# Patient Record
Sex: Male | Born: 1950
Health system: Southern US, Community
[De-identification: ages and names within clinical notes are randomized; demographics above are authoritative.]

## PROBLEM LIST (undated history)

## (undated) DIAGNOSIS — K219 Gastro-esophageal reflux disease without esophagitis: Secondary | ICD-10-CM

## (undated) DIAGNOSIS — Z8669 Personal history of other diseases of the nervous system and sense organs: Secondary | ICD-10-CM

## (undated) DIAGNOSIS — M1990 Unspecified osteoarthritis, unspecified site: Secondary | ICD-10-CM

## (undated) DIAGNOSIS — F32A Depression, unspecified: Secondary | ICD-10-CM

## (undated) DIAGNOSIS — M545 Low back pain, unspecified: Secondary | ICD-10-CM

## (undated) DIAGNOSIS — G4700 Insomnia, unspecified: Secondary | ICD-10-CM

## (undated) DIAGNOSIS — F329 Major depressive disorder, single episode, unspecified: Secondary | ICD-10-CM

## (undated) DIAGNOSIS — C4491 Basal cell carcinoma of skin, unspecified: Secondary | ICD-10-CM

## (undated) DIAGNOSIS — K120 Recurrent oral aphthae: Secondary | ICD-10-CM

## (undated) DIAGNOSIS — R350 Frequency of micturition: Secondary | ICD-10-CM

## (undated) DIAGNOSIS — G5760 Lesion of plantar nerve, unspecified lower limb: Secondary | ICD-10-CM

## (undated) DIAGNOSIS — J439 Emphysema, unspecified: Secondary | ICD-10-CM

## (undated) DIAGNOSIS — R51 Headache: Secondary | ICD-10-CM

## (undated) DIAGNOSIS — I10 Essential (primary) hypertension: Secondary | ICD-10-CM

## (undated) DIAGNOSIS — F419 Anxiety disorder, unspecified: Secondary | ICD-10-CM

## (undated) DIAGNOSIS — R1310 Dysphagia, unspecified: Secondary | ICD-10-CM

## (undated) DIAGNOSIS — M797 Fibromyalgia: Secondary | ICD-10-CM

## (undated) DIAGNOSIS — G4730 Sleep apnea, unspecified: Secondary | ICD-10-CM

## (undated) DIAGNOSIS — R0683 Snoring: Secondary | ICD-10-CM

## (undated) DIAGNOSIS — R519 Headache, unspecified: Secondary | ICD-10-CM

## (undated) DIAGNOSIS — G4733 Obstructive sleep apnea (adult) (pediatric): Secondary | ICD-10-CM

## (undated) DIAGNOSIS — N400 Enlarged prostate without lower urinary tract symptoms: Secondary | ICD-10-CM

## (undated) DIAGNOSIS — B009 Herpesviral infection, unspecified: Secondary | ICD-10-CM

## (undated) HISTORY — DX: Low back pain, unspecified: M54.50

## (undated) HISTORY — PX: TRANSURETHRAL INCISION OF PROSTATE: SHX2573

## (undated) HISTORY — DX: Essential (primary) hypertension: I10

## (undated) HISTORY — DX: Sleep apnea, unspecified: G47.30

## (undated) HISTORY — PX: FOOT SURGERY: SHX648

## (undated) HISTORY — DX: Insomnia, unspecified: G47.00

## (undated) HISTORY — PX: TONSILLECTOMY: SUR1361

## (undated) HISTORY — DX: Headache: R51

## (undated) HISTORY — DX: Personal history of other diseases of the nervous system and sense organs: Z86.69

## (undated) HISTORY — DX: Lesion of plantar nerve, unspecified lower limb: G57.60

## (undated) HISTORY — DX: Dysphagia, unspecified: R13.10

## (undated) HISTORY — PX: MOUTH SURGERY: SHX715

## (undated) HISTORY — DX: Benign prostatic hyperplasia without lower urinary tract symptoms: N40.0

## (undated) HISTORY — DX: Fibromyalgia: M79.7

## (undated) HISTORY — PX: INGUINAL HERNIA REPAIR: SUR1180

## (undated) HISTORY — PX: SKIN CANCER EXCISION: SHX779

## (undated) HISTORY — DX: Unspecified osteoarthritis, unspecified site: M19.90

## (undated) HISTORY — DX: Snoring: R06.83

## (undated) HISTORY — DX: Emphysema, unspecified: J43.9

## (undated) HISTORY — PX: EPIDIDYMECTOMY: SHX478

## (undated) HISTORY — DX: Headache, unspecified: R51.9

## (undated) HISTORY — PX: EYE SURGERY: SHX253

## (undated) HISTORY — DX: Obstructive sleep apnea (adult) (pediatric): G47.33

## (undated) HISTORY — DX: Basal cell carcinoma of skin, unspecified: C44.91

## (undated) HISTORY — DX: Herpesviral infection, unspecified: B00.9

## (undated) HISTORY — DX: Low back pain: M54.5

## (undated) HISTORY — PX: OTHER SURGICAL HISTORY: SHX169

## (undated) HISTORY — DX: Anxiety disorder, unspecified: F41.9

---

## 2008-06-11 ENCOUNTER — Emergency Department (HOSPITAL_BASED_OUTPATIENT_CLINIC_OR_DEPARTMENT_OTHER): Admission: EM | Admit: 2008-06-11 | Discharge: 2008-06-11 | Payer: Self-pay | Admitting: Emergency Medicine

## 2008-06-11 ENCOUNTER — Ambulatory Visit: Payer: Self-pay | Admitting: Diagnostic Radiology

## 2008-09-05 ENCOUNTER — Ambulatory Visit (HOSPITAL_COMMUNITY): Admission: RE | Admit: 2008-09-05 | Discharge: 2008-09-05 | Payer: Self-pay | Admitting: Orthopedic Surgery

## 2008-09-17 ENCOUNTER — Ambulatory Visit (HOSPITAL_COMMUNITY): Admission: RE | Admit: 2008-09-17 | Discharge: 2008-09-17 | Payer: Self-pay | Admitting: Orthopedic Surgery

## 2009-05-27 DIAGNOSIS — G2581 Restless legs syndrome: Secondary | ICD-10-CM | POA: Insufficient documentation

## 2010-06-21 LAB — BASIC METABOLIC PANEL
BUN: 10 mg/dL (ref 6–23)
Calcium: 9.7 mg/dL (ref 8.4–10.5)
GFR calc non Af Amer: >60 mL/min (ref >60)
Glucose, Bld: 92 mg/dL (ref 70–99)
Potassium: 4 mEq/L (ref 3.5–5.1)

## 2010-06-21 LAB — HEMOGLOBIN AND HEMATOCRIT, BLOOD: Hemoglobin: 15.4 g/dL (ref 13.0–17.0)

## 2010-07-27 NOTE — Op Note (Signed)
Patrick [name], Patrick [name]         ACCOUNT NO.:  [id number]   MEDICAL RECORD NO.:  [id number]          PATIENT TYPE:  AMB   LOCATION:  DAY                          FACILITY:  [facility]   PHYSICIAN:  Marlowe Kays, M.D.  DATE OF BIRTH:  Feb 06, 1951   DATE OF PROCEDURE:  09/05/2008  DATE OF DISCHARGE:                               OPERATIVE REPORT   PREOPERATIVE DIAGNOSIS:  Chronic plantar fasciitis left heel.   POSTOPERATIVE DIAGNOSIS:  Chronic plantar fasciitis left heel.   OPERATION:  Release plantar fascia left heel.   SURGEON:  Dr. Simonne Come.   ASSISTANT:  Nurse.   ANESTHESIA:  General.   PATHOLOGY AND JUSTIFICATION FOR PROCEDURE:  He has had chronic pain  __________for chronic plantar fasciitis over the medial aspect of his  left heel, refractory to all nonsurgical treatment.   PROCEDURE:  Satisfactory general anesthesia, pneumatic tourniquet, the  left leg was Esmarched out nonsterilely and tourniquet inflated to 350  mmHg.  The left leg was prepped from ankle to toes with DuraPrep and  draped in a sterile field.  A timeout performed.  I made a 2-cm incision  over the posteromedial arch overlapping the heel.  The plantar fascia  was isolated superiorly and inferiorly and released across the entire  width of the heel with some Metzenbaum scissors.  The fascia was very  thickened and with an audible crunch on sectioning, consistent with his  diagnosis.  When I felt like we had released all the affected fibers,  the wound was irrigated with sterile saline and soft tissues infiltrated  with half-percent plain Marcaine.  The wound was then closed with  interrupted 2-0 Vicryl and subcutaneous tissue with interrupted 4-0  nylon mattress sutures in the skin.  Betadine adaptic dry sterile  dressings were applied.  The tourniquet was released.  He tolerated the  procedure well and was taken to the recovery room in satisfactory  condition with no known complications.     ______________________________  Marlowe Kays, M.D.     JA/MEDQ  D:  09/05/2008  T:  09/05/2008  Job:  [id number]

## 2011-12-20 ENCOUNTER — Encounter (INDEPENDENT_AMBULATORY_CARE_PROVIDER_SITE_OTHER): Payer: Self-pay | Admitting: Surgery

## 2011-12-20 ENCOUNTER — Ambulatory Visit (INDEPENDENT_AMBULATORY_CARE_PROVIDER_SITE_OTHER): Payer: BC Managed Care – PPO | Admitting: Surgery

## 2011-12-20 VITALS — BP 117/77 | HR 83 | Temp 98.1°F | Resp 14 | Ht 73.0 in | Wt 189.0 lb

## 2011-12-20 DIAGNOSIS — K644 Residual hemorrhoidal skin tags: Secondary | ICD-10-CM

## 2011-12-20 NOTE — Progress Notes (Signed)
Subjective:     Patient ID: Patrick [name], male   DOB: 1951/03/09, 61 y.o.   MRN: [id number]  HPI This is a patient who had excision of a thrombosed hemorrhoid by Dr. Zachery Dakins approximately 2 years ago.  He has had perianal discomfort for the last several months. He has been improving somewhat with Preparation H. He describes a burning pain. He has no spasmodic pain has had no bleeding.  Review of Systems     Objective:   Physical Exam On exam, his perianal area is erythematous.  There are no enlarged external hemorrhoids. Digital examination was unremarkable    Assessment:     Inflamed external hemorrhoids    Plan:     I wrote him for steroid cream as well as lidocaine cream. There is nothing else to do from a surgical standpoint. He would do stool softeners and sitz baths. I will see him back as needed

## 2013-09-24 ENCOUNTER — Emergency Department (HOSPITAL_BASED_OUTPATIENT_CLINIC_OR_DEPARTMENT_OTHER)
Admission: EM | Admit: 2013-09-24 | Discharge: 2013-09-24 | Disposition: A | Discharge status: Home / Self Care | Payer: BC Managed Care – PPO | Attending: Emergency Medicine | Admitting: Emergency Medicine

## 2013-09-24 ENCOUNTER — Emergency Department (HOSPITAL_BASED_OUTPATIENT_CLINIC_OR_DEPARTMENT_OTHER): Payer: BC Managed Care – PPO

## 2013-09-24 ENCOUNTER — Encounter (HOSPITAL_BASED_OUTPATIENT_CLINIC_OR_DEPARTMENT_OTHER): Payer: Self-pay | Admitting: Emergency Medicine

## 2013-09-24 DIAGNOSIS — R110 Nausea: Secondary | ICD-10-CM | POA: Insufficient documentation

## 2013-09-24 DIAGNOSIS — R1031 Right lower quadrant pain: Secondary | ICD-10-CM

## 2013-09-24 DIAGNOSIS — Z9889 Other specified postprocedural states: Secondary | ICD-10-CM | POA: Insufficient documentation

## 2013-09-24 DIAGNOSIS — Z87891 Personal history of nicotine dependence: Secondary | ICD-10-CM | POA: Insufficient documentation

## 2013-09-24 LAB — CBC WITH DIFFERENTIAL/PLATELET
Basophils Absolute: 0 10*3/uL (ref 0.0–0.1)
Basophils Relative: 0 % (ref 0–1)
Eosinophils Absolute: 0.1 10*3/uL (ref 0.0–0.7)
Eosinophils Relative: 1 % (ref 0–5)
HCT: 45.3 % (ref 39.0–52.0)
HEMOGLOBIN: 15.7 g/dL (ref 13.0–17.0)
LYMPHS ABS: 1.3 10*3/uL (ref 0.7–4.0)
Lymphocytes Relative: 13 % (ref 12–46)
MCH: 31.3 pg (ref 26.0–34.0)
MCHC: 34.7 g/dL (ref 30.0–36.0)
MCV: 90.2 fL (ref 78.0–100.0)
MONOS PCT: 8 % (ref 3–12)
Monocytes Absolute: 0.8 10*3/uL (ref 0.1–1.0)
NEUTROS ABS: 8.1 10*3/uL — AB (ref 1.7–7.7)
NEUTROS PCT: 79 % — AB (ref 43–77)
Platelets: 150 10*3/uL (ref 150–400)
RBC: 5.02 MIL/uL (ref 4.22–5.81)
RDW: 12.6 % (ref 11.5–15.5)
WBC: 10.3 10*3/uL (ref 4.0–10.5)

## 2013-09-24 LAB — BASIC METABOLIC PANEL
Anion gap: 13 (ref 5–15)
BUN: 16 mg/dL (ref 6–23)
CHLORIDE: 100 meq/L (ref 96–112)
CO2: 28 mEq/L (ref 19–32)
Calcium: 10.1 mg/dL (ref 8.4–10.5)
Creatinine, Ser: 1.1 mg/dL (ref 0.50–1.35)
GFR calc non Af Amer: 71 mL/min — ABNORMAL LOW (ref >90)
GFR, EST AFRICAN AMERICAN: 82 mL/min — AB (ref >90)
GLUCOSE: 96 mg/dL (ref 70–99)
POTASSIUM: 3.9 meq/L (ref 3.7–5.3)
Sodium: 141 mEq/L (ref 137–147)

## 2013-09-24 LAB — URINALYSIS, ROUTINE W REFLEX MICROSCOPIC
Bilirubin Urine: NEGATIVE
Glucose, UA: NEGATIVE mg/dL
Hgb urine dipstick: NEGATIVE
Ketones, ur: NEGATIVE mg/dL
LEUKOCYTES UA: NEGATIVE
NITRITE: NEGATIVE
PH: 6.5 (ref 5.0–8.0)
Protein, ur: NEGATIVE mg/dL
SPECIFIC GRAVITY, URINE: 1.009 (ref 1.005–1.030)
Urobilinogen, UA: 0.2 mg/dL (ref 0.0–1.0)

## 2013-09-24 MED ORDER — IOHEXOL 300 MG/ML  SOLN
100.0000 mL | Freq: Once | INTRAMUSCULAR | Status: AC | PRN
  Administered 2013-09-24: 100 mL via INTRAVENOUS

## 2013-09-24 MED ORDER — HYDROCODONE-ACETAMINOPHEN 5-325 MG PO TABS: 1.0000 | ORAL_TABLET | Freq: Four times a day (QID) | ORAL | Status: DC | PRN

## 2013-09-24 MED ORDER — ONDANSETRON HCL 4 MG PO TABS: 4.0000 mg | ORAL_TABLET | Freq: Four times a day (QID) | ORAL | Status: DC

## 2013-09-24 MED ORDER — IOHEXOL 300 MG/ML  SOLN
50.0000 mL | Freq: Once | INTRAMUSCULAR | Status: AC | PRN
  Administered 2013-09-24: 50 mL via ORAL

## 2013-09-24 NOTE — ED Notes (Signed)
C/oRLQ pain, indigestion started today

## 2013-09-24 NOTE — Discharge Instructions (Signed)

## 2013-09-24 NOTE — ED Notes (Signed)
MD at bedside discussing test results and dispo plan of care. 

## 2013-09-24 NOTE — ED Provider Notes (Signed)
CSN: [id number]     Arrival date & time 09/24/13  1738 History   First MD Initiated Contact with Patient 09/24/13 1747     Chief Complaint  Patient presents with  . Abdominal Pain     (Consider location/radiation/quality/duration/timing/severity/associated sxs/prior Treatment) HPI  This is a 63 year old male who presents from [facility] with right lower cough for pain. Patient reports onset of pain at lunch today. He reports pain in the right lower cough. It is achy and nonradiating. He denies any urinary symptoms or hematuria. He does endorse nausea without vomiting. No diarrhea noted. Currently his pain is 4/10. He was seen at [facility] and sent here for evaluation for appendicitis. He denies any fevers.  Patient took antacids after lunch without relief.  History reviewed. No pertinent past medical history. Past Surgical History  Procedure Laterality Date  . Pancreatic surgery r/t trauma     No family history on file. History  Substance Use Topics  . Smoking status: Former Research scientist (life sciences)  . Smokeless tobacco: Not on file  . Alcohol Use: Yes    Review of Systems  Constitutional: Negative.  Negative for fever.  Respiratory: Negative.  Negative for chest tightness and shortness of breath.   Cardiovascular: Negative.  Negative for chest pain.  Gastrointestinal: Positive for nausea and abdominal pain. Negative for vomiting and diarrhea.  Genitourinary: Negative.  Negative for dysuria and hematuria.  Musculoskeletal: Negative for back pain.  Neurological: Negative for headaches.  All other systems reviewed and are negative.     Allergies  Review of patient's allergies indicates no known allergies.  Home Medications   Prior to Admission medications   Medication Sig Start Date End Date Taking? Authorizing Provider  NORTRIPTYLINE HCL PO Take by mouth.   Yes Historical Provider, MD  Pregabalin (LYRICA PO) Take by mouth.   Yes Historical Provider, MD  Zolpidem Tartrate  (AMBIEN PO) Take by mouth.   Yes Historical Provider, MD  amitriptyline (ELAVIL) 75 MG tablet  12/01/11   Historical Provider, MD  HYDROcodone-acetaminophen (NORCO/VICODIN) 5-325 MG per tablet  12/15/11   Historical Provider, MD  HYDROcodone-acetaminophen (NORCO/VICODIN) 5-325 MG per tablet Take 1 tablet by mouth every 6 (six) hours as needed. 09/24/13   Merryl Hacker, MD  ondansetron (ZOFRAN) 4 MG tablet Take 1 tablet (4 mg total) by mouth every 6 (six) hours. 09/24/13   Merryl Hacker, MD   BP 144/93  Pulse 78  Temp(Src) 99.1 F (37.3 C) (Oral)  Resp 16  Ht 6' (1.829 m)  Wt 188 lb (85.276 kg)  BMI 25.49 kg/m2  SpO2 99% Physical Exam  Nursing note and vitals reviewed. Constitutional: He is oriented to person, place, and time. He appears well-developed and well-nourished. No distress.  HENT:  Head: Normocephalic and atraumatic.  Mouth/Throat: Oropharynx is clear and moist.  Eyes: Pupils are equal, round, and reactive to light.  Cardiovascular: Normal rate, regular rhythm and normal heart sounds.   No murmur heard. Pulmonary/Chest: Effort normal and breath sounds normal. No respiratory distress. He has no wheezes.  Abdominal: Soft. Bowel sounds are normal. There is tenderness. There is no rebound.  Tenderness to palpation over the right lower quadrant, no rebound or guarding, negative Rovsing  Musculoskeletal: He exhibits no edema.  Lymphadenopathy:    He has no cervical adenopathy.  Neurological: He is alert and oriented to person, place, and time.  Skin: Skin is warm and dry.  Psychiatric: He has a normal mood and affect.    ED Course  Procedures (including critical care time) Labs Review Labs Reviewed  CBC WITH DIFFERENTIAL - Abnormal; Notable for the following:    Neutrophils Relative % 79 (*)    Neutro Abs 8.1 (*)    All other components within normal limits  BASIC METABOLIC PANEL - Abnormal; Notable for the following:    GFR calc non Af Amer 71 (*)    GFR calc Af  Amer 82 (*)    All other components within normal limits  URINALYSIS, ROUTINE W REFLEX MICROSCOPIC    Imaging Review Ct Abdomen Pelvis W Contrast  09/24/2013   CLINICAL DATA:  Right lower quadrant pain  EXAM: CT ABDOMEN AND PELVIS WITH CONTRAST  TECHNIQUE: Multidetector CT imaging of the abdomen and pelvis was performed using the standard protocol following bolus administration of intravenous contrast.  CONTRAST:  55mL OMNIPAQUE IOHEXOL 300 MG/ML SOLN, 173mL OMNIPAQUE IOHEXOL 300 MG/ML SOLN  COMPARISON:  None.  FINDINGS: Lung bases are free of acute infiltrate or sizable effusion. Mild coronary calcifications are seen.  The gallbladder, liver, spleen, adrenal glands and pancreas are within normal limits.  Kidneys are well visualized and demonstrate a normal enhancement pattern. Minimal fullness of the collecting systems is noted bilaterally although no obstructive changes are seen. This may be related to mild ureterovesical junction narrowing. Normal excretion of contrast material is noted on delayed images. The appendix is within normal limits.  Bladder is well distended. No pelvic mass lesion is seen. No bony abnormality is seen.  IMPRESSION: No acute abnormality noted.   Electronically Signed   By: Inez Catalina M.D.   On: 09/24/2013 19:47     EKG Interpretation None      MDM   Final diagnoses:  Right lower quadrant abdominal pain   Patient presents with onset of right lower cough her pain today. He is afebrile and nontoxic on exam. No signs of peritonitis. Was evaluated at another facility and is here for further evaluation for appendicitis. Presentation is not convincing for appendicitis; however, patient does have right lower cough or pain. Other considerations include kidney stone and less likely colitis given absence of diarrhea. Basic labwork obtained. Patient has declined pain medication. CT scan of the abdomen and pelvis was obtained to evaluate for his kidney stones and appendicitis.  CT scan is within normal limits. Discussed results with the patient. He states that his pain has gotten better but he still does have some nausea. He is continuing to decline pain medication. We'll discharge him with pain medication. Have given the patient return precautions including onset of fever, worsening right lower quadrant pain, or any new or worsening symptoms. While imaging is negative at this time, it is not perfect and he needs to be reassessed if his symptoms do not resolve in the next 24 hours.  After history, exam, and medical workup I feel the patient has been appropriately medically screened and is safe for discharge home. Pertinent diagnoses were discussed with the patient. Patient was given return precautions.     Merryl Hacker, MD 09/24/13 2001

## 2013-11-18 DIAGNOSIS — M999 Biomechanical lesion, unspecified: Secondary | ICD-10-CM | POA: Insufficient documentation

## 2013-11-18 DIAGNOSIS — R109 Unspecified abdominal pain: Secondary | ICD-10-CM | POA: Insufficient documentation

## 2013-11-18 DIAGNOSIS — K5900 Constipation, unspecified: Secondary | ICD-10-CM | POA: Insufficient documentation

## 2013-11-18 DIAGNOSIS — K219 Gastro-esophageal reflux disease without esophagitis: Secondary | ICD-10-CM | POA: Insufficient documentation

## 2013-11-18 DIAGNOSIS — G43909 Migraine, unspecified, not intractable, without status migrainosus: Secondary | ICD-10-CM | POA: Insufficient documentation

## 2013-11-18 DIAGNOSIS — M5136 Other intervertebral disc degeneration, lumbar region: Secondary | ICD-10-CM | POA: Insufficient documentation

## 2013-11-18 DIAGNOSIS — L209 Atopic dermatitis, unspecified: Secondary | ICD-10-CM | POA: Insufficient documentation

## 2013-11-18 DIAGNOSIS — IMO0001 Reserved for inherently not codable concepts without codable children: Secondary | ICD-10-CM | POA: Insufficient documentation

## 2013-11-18 DIAGNOSIS — E236 Other disorders of pituitary gland: Secondary | ICD-10-CM | POA: Insufficient documentation

## 2013-11-18 DIAGNOSIS — J343 Hypertrophy of nasal turbinates: Secondary | ICD-10-CM | POA: Insufficient documentation

## 2013-11-18 DIAGNOSIS — G4733 Obstructive sleep apnea (adult) (pediatric): Secondary | ICD-10-CM | POA: Insufficient documentation

## 2013-11-18 DIAGNOSIS — N3281 Overactive bladder: Secondary | ICD-10-CM | POA: Insufficient documentation

## 2013-11-18 DIAGNOSIS — M7710 Lateral epicondylitis, unspecified elbow: Secondary | ICD-10-CM | POA: Insufficient documentation

## 2013-11-18 DIAGNOSIS — M47816 Spondylosis without myelopathy or radiculopathy, lumbar region: Secondary | ICD-10-CM | POA: Insufficient documentation

## 2013-11-18 DIAGNOSIS — K6289 Other specified diseases of anus and rectum: Secondary | ICD-10-CM | POA: Insufficient documentation

## 2013-11-18 DIAGNOSIS — S6710XA Crushing injury of unspecified finger(s), initial encounter: Secondary | ICD-10-CM | POA: Insufficient documentation

## 2013-11-18 DIAGNOSIS — N138 Other obstructive and reflux uropathy: Secondary | ICD-10-CM | POA: Insufficient documentation

## 2013-11-18 DIAGNOSIS — N320 Bladder-neck obstruction: Secondary | ICD-10-CM | POA: Insufficient documentation

## 2013-11-18 DIAGNOSIS — R599 Enlarged lymph nodes, unspecified: Secondary | ICD-10-CM | POA: Insufficient documentation

## 2013-11-18 DIAGNOSIS — J342 Deviated nasal septum: Secondary | ICD-10-CM | POA: Insufficient documentation

## 2013-11-18 DIAGNOSIS — N401 Enlarged prostate with lower urinary tract symptoms: Secondary | ICD-10-CM

## 2014-04-09 DIAGNOSIS — M5459 Other low back pain: Secondary | ICD-10-CM | POA: Insufficient documentation

## 2014-07-29 ENCOUNTER — Ambulatory Visit (INDEPENDENT_AMBULATORY_CARE_PROVIDER_SITE_OTHER): Payer: BLUE CROSS/BLUE SHIELD | Admitting: Neurology

## 2014-07-29 ENCOUNTER — Encounter: Payer: Self-pay | Admitting: Neurology

## 2014-07-29 VITALS — BP 138/90 | HR 78 | Resp 16 | Ht 73.0 in | Wt 192.0 lb

## 2014-07-29 DIAGNOSIS — G4761 Periodic limb movement disorder: Secondary | ICD-10-CM

## 2014-07-29 DIAGNOSIS — G4733 Obstructive sleep apnea (adult) (pediatric): Secondary | ICD-10-CM | POA: Diagnosis not present

## 2014-07-29 DIAGNOSIS — E663 Overweight: Secondary | ICD-10-CM

## 2014-07-29 DIAGNOSIS — R0683 Snoring: Secondary | ICD-10-CM

## 2014-07-29 DIAGNOSIS — G4719 Other hypersomnia: Secondary | ICD-10-CM

## 2014-07-29 DIAGNOSIS — R51 Headache: Secondary | ICD-10-CM

## 2014-07-29 DIAGNOSIS — R519 Headache, unspecified: Secondary | ICD-10-CM

## 2014-07-29 DIAGNOSIS — R030 Elevated blood-pressure reading, without diagnosis of hypertension: Secondary | ICD-10-CM

## 2014-07-29 NOTE — Patient Instructions (Addendum)
Based on your symptoms and your exam I believe you are at risk for obstructive sleep apnea or OSA, and I think we should proceed with a sleep study to determine whether you do or do not have OSA and how severe it is. If you have more than mild OSA, I want you to consider treatment with CPAP. Please remember, the risks and ramifications of moderate to severe obstructive sleep apnea or OSA are: Cardiovascular disease, including congestive heart failure, stroke, difficult to control hypertension, arrhythmias, and even type 2 diabetes has been linked to untreated OSA. Sleep apnea causes disruption of sleep and sleep deprivation in most cases, which, in turn, can cause recurrent headaches, problems with memory, mood, concentration, focus, and vigilance. Most people with untreated sleep apnea report excessive daytime sleepiness, which can affect their ability to drive. Please do not drive if you feel sleepy.  I will see you back after your sleep study to go over the test results and where to go from there. We will call you after your sleep study and to set up an appointment at the time.   Our sleep lab administrative assistant, Angelina Sheriff will call you to schedule your sleep study. If you don't hear back from her by next week please feel free to call her at [phone]. This is her direct line and please leave a message with your phone number to call back if you get the voicemail box.   Please remember to try to maintain good sleep hygiene, which means: Keep a regular sleep and wake schedule, try not to exercise or have a meal within 2 hours of your bedtime, try to keep your bedroom conducive for sleep, that is, cool and dark, without light distractors such as an illuminated alarm clock, and refrain from watching TV right before sleep or in the middle of the night and do not keep the TV or radio on during the night. Also, try not to use or play on electronic devices at bedtime, such as your cell phone, tablet PC or  laptop. If you like to read at bedtime on an electronic device, try to dim the background light as much as possible. Do not eat in the middle of the night.

## 2014-07-29 NOTE — Progress Notes (Signed)
Subjective:    Patient ID: Patrick [name] is a 64 y.o. male.  HPI     Star Age, MD, PhD [facility] [address] [place], [place] [address]  Dear Dr. Jeralene Huff,   I saw your patient, Patrick [name], upon your kind request in my neurologic clinic today for initial consultation of his sleep disorder, in particular, concern for underlying obstructive sleep apnea. The patient is unaccompanied today. As you know, [name] is a 64 year old right-handed gentleman with an underlying medical history of hypertension, reflux disease, migraine headaches, anxiety, arthritis, chronic low back pain, secondary to lumbar spondylosis and degenerative disc disease, and overweight state, who reports excessive daytime somnolence as well as difficulty maintaining sleep. He has been tried on medications for sleep including temazepam and prior to that he was tried on Seroquel. This caused him daytime grogginess. Prior to that he tried Zolpidem. I reviewed your office note from 07/11/2014 as well as 05/15/2014, which you kindly included. Recent laboratory test results were reviewed as well. This was from 06/13/2014 and included a urinalysis which was negative, CBC with differential was unremarkable, lipid panel showed total cholesterol of 177, LDL of 102, CMP was unremarkable, TSH was normal.   He has had at least one sleep study in the past, which showed mild OSA, and he tried CPAP, but did not like it and stopped using it. He lost about 30 lb and maintained the weight loss. He has a long-standing history of difficulty with sleep onset and sleep maintenance. Ambien helped him go to sleep but stopped working after some time. He is currently on temazepam 15 mg each night. He was diagnosed with fibromyalgia many years ago. He has tried multiple medications for his fibromyalgia. He has started seeing a counselor and also has an appointment with a new psychiatrist  in [place] on 08/15/2014 He tries to be in bed by 11 PM but has a hard time going to sleep. Sometimes he will wake up after one hour and has a difficult time going back to sleep. He has a family history of insomnia in his mother and his brother. His Epworth sleepiness score is 22 out of 24 today, fatigue score is 56 out of 63 today. He snores mildly. His wife has noted some gurgling noises while he is asleep. Previously he had issues with waking up with a gasping sensation but this was before he was able to lose weight. He does have mild restless leg symptoms but his leg discomfort does not keep him up. He has a history of leg twitching at night and his previous sleep study did show apparently some leg movements.   He does not drink caffeine. He stopped smoking over 10 years ago. He drinks alcohol occasionally.  His Past Medical History Is Significant For: Past Medical History  Diagnosis Date  . Headache   . Insomnia   . Herpes simplex type 1 infection   . Anxiety   . Arthritis   . Lower back pain   . BPH (benign prostatic hyperplasia)   . Dysphagia   . Morton's neuroma   . OSA (obstructive sleep apnea)   . Primary snoring   . Fibromyalgia   . Hypertension     His Past Surgical History Is Significant For: Past Surgical History  Procedure Laterality Date  . Pancreatic surgery r/t trauma    . Tonsillectomy    . Transurethral incision of prostate      His Family  History Is Significant For: Family History  Problem Relation Age of Onset  . Coronary artery disease Mother   . Stroke Mother   . Hypertension Father     His Social History Is Significant For: History   Social History  . Marital Status: Married    Spouse Name: N/A  . Number of Children: 2  . Years of Education: BS    Occupational History  . Government social research officer    Social History Main Topics  . Smoking status: Former Research scientist (life sciences)  . Smokeless tobacco: Not on file     Comment: Quit 2000   . Alcohol Use: 0.0  oz/week    0 Standard drinks or equivalent per week     Comment: Rare  . Drug Use: No  . Sexual Activity: Not on file   Other Topics Concern  . None   Social History Narrative   Denies caffeine use     His Allergies Are:  No Known Allergies:   His Current Medications Are:  Outpatient Encounter Prescriptions as of 07/29/2014  Medication Sig  . butalbital-acetaminophen-caffeine (FIORICET WITH CODEINE) 50-325-40-30 MG per capsule Take 1 capsule by mouth every 4 (four) hours as needed for headache.  . fesoterodine (TOVIAZ) 8 MG TB24 tablet Take 8 mg by mouth daily.  Marland Kitchen HYDROcodone-acetaminophen (NORCO/VICODIN) 5-325 MG per tablet Take 1 tablet by mouth every 6 (six) hours as needed.  . meloxicam (MOBIC) 15 MG tablet Take 15 mg by mouth daily.  . methocarbamol (ROBAXIN) 500 MG tablet Take 500 mg by mouth 4 (four) times daily.  Marland Kitchen omeprazole (PRILOSEC) 40 MG capsule Take 40 mg by mouth daily.  . pregabalin (LYRICA) 150 MG capsule Take 300 mg by mouth at bedtime.  . sertraline (ZOLOFT) 50 MG tablet   . temazepam (RESTORIL) 15 MG capsule Take 15 mg by mouth at bedtime as needed for sleep.  Marland Kitchen Zolpidem Tartrate (AMBIEN PO) Take by mouth.   No facility-administered encounter medications on file as of 07/29/2014.  :  Review of Systems:  Out of a complete 14 point review of systems, all are reviewed and negative with the exception of these symptoms as listed below:   Review of Systems  HENT: Positive for hearing loss.   Eyes:       Blurred vision   Neurological:       Reports trouble falling asleep, wakes up many times in the night, wife reports gurgling sounds during sleep, wakes up feeling tired in the morning, daytime sleepiness, feels that he is unable to function during day due to tiredness.   Psychiatric/Behavioral:       Anxiety     Objective:  Neurologic Exam  Physical Exam Physical Examination:   Filed Vitals:   07/29/14 1000  BP: 138/90  Pulse: 78  Resp: 16     General Examination: The patient is a very pleasant 64 y.o. male in no acute distress. He appears well-developed and well-nourished and well groomed.   HEENT: Normocephalic, atraumatic, pupils are equal, round and reactive to light and accommodation. Funduscopic exam is normal with sharp disc margins noted. Extraocular tracking is good without limitation to gaze excursion or nystagmus noted. Normal smooth pursuit is noted. Hearing is grossly intact. Tympanic membranes are clear bilaterally. Face is symmetric with normal facial animation and normal facial sensation. Speech is clear with no dysarthria noted. There is no hypophonia. There is no lip, neck/head, jaw or voice tremor. Neck is supple with full range of passive and active motion. There  are no carotid bruits on auscultation. Oropharynx exam reveals: mild mouth dryness, adequate dental hygiene and mild airway crowding, due to larger tongue and redundant soft palate. Tonsils are either small or absent. Mallampati is class I. Necrotizing 16 inches.  Chest: Clear to auscultation without wheezing, rhonchi or crackles noted.  Heart: S1+S2+0, regular and normal without murmurs, rubs or gallops noted.   Abdomen: Soft, non-tender and non-distended with normal bowel sounds appreciated on auscultation.  Extremities: There is no pitting edema in the distal lower extremities bilaterally. Pedal pulses are intact.  Skin: Warm and dry without trophic changes noted. There are no varicose veins.  Musculoskeletal: exam reveals no obvious joint deformities, tenderness or joint swelling or erythema.   Neurologically:  Mental status: The patient is awake, alert and oriented in all 4 spheres. His immediate and remote memory, attention, language skills and fund of knowledge are appropriate. There is no evidence of aphasia, agnosia, apraxia or anomia. Speech is clear with normal prosody and enunciation. Thought process is linear. Mood is normal and affect is  blunted.  Cranial nerves II - XII are as described above under HEENT exam. In addition: shoulder shrug is normal with equal shoulder height noted. Motor exam: Normal bulk, strength and tone is noted. There is no drift, tremor or rebound. Romberg is negative. Reflexes are 2+ throughout. Babinski: Toes are flexor bilaterally. Fine motor skills and coordination: intact with normal finger taps, normal hand movements, normal rapid alternating patting, normal foot taps and normal foot agility.  Cerebellar testing: No dysmetria or intention tremor on finger to nose testing. Heel to shin is unremarkable bilaterally. There is no truncal or gait ataxia.  Sensory exam: intact to light touch, pinprick, vibration, temperature sense in the upper and lower extremities.  Gait, station and balance: He stands easily. No veering to one side is noted. No leaning to one side is noted. Posture is age-appropriate and stance is narrow based. Gait shows normal stride length and normal pace. No problems turning are noted. He turns en bloc. Tandem walk is unremarkable.   Assessment and Plan:  In summary, DEQUAN KINDRED is a very pleasant 64 y.o.-year old male with an underlying medical history of hypertension, reflux disease, migraine headaches, anxiety, arthritis, chronic low back pain, secondary to lumbar spondylosis and degenerative disc disease, and overweight state, who reports excessive daytime somnolence as well as difficulty maintaining sleep. He was previously diagnosed with mild obstructive sleep apnea as I understand and tried CPAP. This was years ago. He has since then lost weight. Nevertheless, he would benefit from a reevaluation with a sleep study. He also endorses mild and not very bothersome restless leg symptoms at this time and was previously diagnosed with leg twitching at night, in keeping with PLMD.  I had a long chat with the patient about my findings and the diagnosis of OSA and organic sleep disorders  in general, the prognosis and treatment options. We talked about medical treatments, surgical interventions and non-pharmacological approaches. I explained in particular the risks and ramifications of untreated moderate to severe OSA, especially with respect to developing cardiovascular disease down the Road, including congestive heart failure, difficult to treat hypertension, cardiac arrhythmias, or stroke. Even type 2 diabetes has, in part, been linked to untreated OSA. Symptoms of untreated OSA include daytime sleepiness, memory problems, mood irritability and mood disorder such as depression and anxiety, lack of energy, as well as recurrent headaches, especially morning headaches. We talked about trying to maintain a healthy lifestyle  in general, as well as the importance of weight control. I encouraged the patient to eat healthy, exercise daily and keep well hydrated, to keep a scheduled bedtime and wake time routine, to not skip any meals and eat healthy snacks in between meals. I advised the patient not to drive when feeling sleepy. I recommended the following at this time: sleep study with potential positive airway pressure titration. (We will score hypopneas at 3% and split the sleep study into diagnostic and treatment portion, if the estimated. 2 hour AHI is >15/h).  We will be on the look out for PLMD. For his chronic insomnia he is encouraged to continue to see his psychologist and his new psychiatrist. He has an appointment coming up in early June. I explained the sleep test procedure to the patient and also outlined possible surgical and non-surgical treatment options of OSA, including the use of a custom-made dental device (which would require a referral to a specialist dentist or oral surgeon), upper airway surgical options, such as pillar implants, radiofrequency surgery, tongue base surgery, and UPPP (which would involve a referral to an ENT surgeon). Rarely, jaw surgery such as mandibular  advancement may be considered.  I also explained the CPAP treatment option to the patient, who indicated that he would be willing to try CPAP if the need arises. I explained the importance of being compliant with PAP treatment, not only for insurance purposes but primarily to improve His symptoms, and for the patient's long term health benefit, including to reduce His cardiovascular risks. I answered all his questions today and the patient was in agreement. I would like to see him back after the sleep study is completed and encouraged him to call with any interim questions, concerns, problems or updates.   Thank you very much for allowing me to participate in the care of this nice patient. If I can be of any further assistance to you please do not hesitate to call me at [phone].  Sincerely,   Star Age, MD, PhD

## 2014-08-15 ENCOUNTER — Ambulatory Visit (INDEPENDENT_AMBULATORY_CARE_PROVIDER_SITE_OTHER): Payer: BLUE CROSS/BLUE SHIELD | Admitting: Psychiatry

## 2014-08-15 ENCOUNTER — Encounter (HOSPITAL_COMMUNITY): Payer: Self-pay | Admitting: Psychiatry

## 2014-08-15 VITALS — Ht 72.0 in | Wt 185.0 lb

## 2014-08-15 DIAGNOSIS — M797 Fibromyalgia: Secondary | ICD-10-CM | POA: Diagnosis not present

## 2014-08-15 DIAGNOSIS — F341 Dysthymic disorder: Secondary | ICD-10-CM | POA: Diagnosis not present

## 2014-08-15 DIAGNOSIS — G4700 Insomnia, unspecified: Secondary | ICD-10-CM

## 2014-08-15 DIAGNOSIS — F411 Generalized anxiety disorder: Secondary | ICD-10-CM | POA: Diagnosis not present

## 2014-08-15 MED ORDER — SERTRALINE HCL 100 MG PO TABS: 100.0000 mg | ORAL_TABLET | Freq: Every day | ORAL | Status: DC

## 2014-08-15 MED ORDER — TEMAZEPAM 15 MG PO CAPS: ORAL_CAPSULE | ORAL | Status: DC

## 2014-08-15 MED ORDER — TRAZODONE HCL 50 MG PO TABS: ORAL_TABLET | ORAL | Status: DC

## 2014-08-15 NOTE — Progress Notes (Deleted)
Patient ID: Patrick [name], male   DOB: April 06, 1950, 64 y.o.   MRN: [id number]
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
## 2014-08-15 NOTE — Patient Instructions (Signed)
On weekends take temazepam 15mg  at 11 at night and then repeat at 2 am when you wake up. On weekdays or working days take trazadone at night uptil 100mg  at 10 pm and temazepam 15mg  at night. Increase sertraline to 100mg  for anxiety.

## 2014-08-17 ENCOUNTER — Ambulatory Visit (INDEPENDENT_AMBULATORY_CARE_PROVIDER_SITE_OTHER): Payer: BLUE CROSS/BLUE SHIELD | Admitting: Neurology

## 2014-08-17 VITALS — BP 139/91

## 2014-08-17 DIAGNOSIS — G4730 Sleep apnea, unspecified: Secondary | ICD-10-CM

## 2014-08-17 DIAGNOSIS — G479 Sleep disorder, unspecified: Secondary | ICD-10-CM

## 2014-08-17 DIAGNOSIS — G4733 Obstructive sleep apnea (adult) (pediatric): Secondary | ICD-10-CM

## 2014-08-17 DIAGNOSIS — G4720 Circadian rhythm sleep disorder, unspecified type: Secondary | ICD-10-CM

## 2014-08-17 DIAGNOSIS — G4710 Hypersomnia, unspecified: Secondary | ICD-10-CM

## 2014-08-17 NOTE — Sleep Study (Signed)
Please see the scanned sleep study interpretation located in the Procedure tab within the Chart Review section. 

## 2014-08-21 ENCOUNTER — Telehealth: Payer: Self-pay | Admitting: Neurology

## 2014-08-21 DIAGNOSIS — G4733 Obstructive sleep apnea (adult) (pediatric): Secondary | ICD-10-CM

## 2014-08-21 NOTE — Telephone Encounter (Signed)
Report faxed to PCP 

## 2014-08-21 NOTE — Telephone Encounter (Signed)
Patient seen on 07/29/14 and diagnostic sleep study on 08/17/14.  Please call and notify the patient that the recent sleep study did confirm the diagnosis of overall moderate obstructive sleep apnea and that I recommend treatment for this in the form of CPAP. This will require a repeat sleep study for proper titration and mask fitting. Please explain to patient and arrange for a CPAP titration study. I have placed an order in the chart. Thanks, and please route to Alycia for further processing the authorization/scheduling.  Star Age, MD, PhD [facility])

## 2014-08-21 NOTE — Telephone Encounter (Signed)
I spoke to patient and he is aware of results. Patrick [name] has called the patient and scheduled the 2nd sleep study. Patient voices understanding.

## 2014-08-28 ENCOUNTER — Ambulatory Visit (INDEPENDENT_AMBULATORY_CARE_PROVIDER_SITE_OTHER): Payer: BLUE CROSS/BLUE SHIELD | Admitting: Neurology

## 2014-08-28 DIAGNOSIS — G4733 Obstructive sleep apnea (adult) (pediatric): Secondary | ICD-10-CM

## 2014-08-29 NOTE — Sleep Study (Signed)
See scanned documents in Encounters tab

## 2014-09-03 ENCOUNTER — Telehealth: Payer: Self-pay | Admitting: Neurology

## 2014-09-03 ENCOUNTER — Telehealth: Payer: Self-pay

## 2014-09-03 DIAGNOSIS — G4733 Obstructive sleep apnea (adult) (pediatric): Secondary | ICD-10-CM

## 2014-09-03 NOTE — Telephone Encounter (Signed)
I spoke to patient. He is aware of results and recommendation. He is willing to proceed. He would like to use [facility] on [address]. I will send order to Webb Silversmith with [facility] today. I will also fax this 2nd report to PCP.

## 2014-09-03 NOTE — Telephone Encounter (Signed)
Patient seen on 07/29/14, baseline sleep study 08/17/14 and CPAP titration study on 08/28/14.  Please call and inform patient that I have entered an order for treatment with PAP. He did well during the latest sleep study with CPAP. We will, therefore, arrange for a machine for home use through a DME (durable medical equipment) company of His choice; and I will see the patient back in follow-up in about 8 to 10 weeks. Please also explain to the patient that I will be looking out for compliance data downloaded from the machine, which can be done remotely through a modem at times or stored on an SD card in the back of the machine. At the time of the followup appointment we will discuss sleep study results and how it is going with PAP treatment at home. Please advise patient to bring His machine at the time of the visit; at least for the first visit, even though this is cumbersome. Bringing the machine for every visit after that may not be needed, but often helps for the first visit. Please also make sure, the patient has a follow-up appointment with me in about 8-10 weeks from the setup date, thanks.   Star Age, MD, PhD [facility])

## 2014-09-03 NOTE — Telephone Encounter (Signed)
Opened in error

## 2014-09-04 ENCOUNTER — Telehealth (HOSPITAL_COMMUNITY): Payer: Self-pay | Admitting: Psychiatry

## 2014-09-04 ENCOUNTER — Encounter (HOSPITAL_COMMUNITY): Payer: Self-pay | Admitting: Psychiatry

## 2014-09-04 ENCOUNTER — Ambulatory Visit (INDEPENDENT_AMBULATORY_CARE_PROVIDER_SITE_OTHER): Payer: BLUE CROSS/BLUE SHIELD | Admitting: Psychiatry

## 2014-09-04 VITALS — Ht 72.0 in | Wt 185.0 lb

## 2014-09-04 DIAGNOSIS — G4700 Insomnia, unspecified: Secondary | ICD-10-CM | POA: Diagnosis not present

## 2014-09-04 DIAGNOSIS — F411 Generalized anxiety disorder: Secondary | ICD-10-CM | POA: Diagnosis not present

## 2014-09-04 DIAGNOSIS — F341 Dysthymic disorder: Secondary | ICD-10-CM | POA: Diagnosis not present

## 2014-09-04 MED ORDER — TEMAZEPAM 15 MG PO CAPS: ORAL_CAPSULE | ORAL | Status: DC

## 2014-09-04 MED ORDER — TRAZODONE HCL 100 MG PO TABS: ORAL_TABLET | ORAL | Status: DC

## 2014-09-04 MED ORDER — SERTRALINE HCL 100 MG PO TABS: 100.0000 mg | ORAL_TABLET | Freq: Every day | ORAL | Status: DC

## 2014-09-04 NOTE — Telephone Encounter (Signed)
zoloft sent as refill

## 2014-09-04 NOTE — Progress Notes (Signed)
Patient ID: Patrick [name], male   DOB: 06-Jun-1950, 64 y.o.   MRN: [id number]  Outpatient Follow up visit  Patient Identification: RASHED EDLER MRN:  [id number] Date of Evaluation:  09/04/2014 Referral Source: Primary care  Chief Complaint:   Chief Complaint    Stress; Insomnia     Visit Diagnosis:    ICD-9-CM ICD-10-CM   1. GAD (generalized anxiety disorder) 300.02 F41.1   2. Insomnia 780.52 G47.00   3. Dysthymia 300.4 F34.1    Diagnosis:   Patient Active Problem List   Diagnosis Date Noted  . Benign prostatic hyperplasia with urinary obstruction [N40.1] 11/18/2013  . Acid reflux [K21.9] 11/18/2013  . Degenerative arthritis of lumbar spine [M47.816] 11/18/2013  . Headache, migraine [G43.909] 11/18/2013  . Obstructive apnea [G47.33] 11/18/2013  . External hemorrhoids with other complication [Z22.4] 82/50/0370  . Restless leg [G25.81] 05/27/2009   History of Present Illness:  Patient is a married middle-aged Caucasian male. Referred by primary care physician because of anxiety and insomnia This is return visit for follow up.   Las visit we adjusted temazepam so he takes at 11pm and 2am. He has done at times alongwith taking tazadone. Says no significant change. Sleep study done and is pending for Cpap machine Worries are somewhat improved on zoloft which was increased to 100mg  last visit'  Patient remains still pessimistic although not hopeless or suicidal. He says that he is not able to function at work because he remains foggy. He is looking forward to extend his leave.  Still has low-grade depression without hopelessness or suicidal. There is no reported side effects. His sleep study has been done but it is still a slow process to get says pressure adjusted and gets a C Pap machine  Does not endorse manic symptoms currently or in the past no psychotic symptoms currently or in the past  Aggravating factors work stress, some concern about relationship says that is  his difficult for the wife does not understand why he is also on any medication and his medical issues.  Modifying factors; his kids, he plays golf he does try to call side.  As using drugs he occasionally uses alcohol but not on a regular basis  Past psychiatric history;  No psychiatric admission or suicide attempt in the past. He has been on different medications past for insomnia  he has tried trazodone, seroquel 50mg . Ambien did help in the beginning but has not kept him sleep. He has been on Elavil and nortriptyline   Elements:  Location:  cannot sleep. Quality:  for many years. difficult going or mantaining sleep. Severity:  severe. effecting work and cannot funciton during day. Timing:  at night. Duration:  more then 10 years. Context:  job stress. poor sleep. multiple medical problems including fibromyalgia, arthritis, restless legs OSA. Associated Signs/Symptoms: Depression Symptoms:  anhedonia, insomnia, difficulty concentrating, anxiety, loss of energy/fatigue, (Hypo) Manic Symptoms:  Labiality of Mood, Anxiety Symptoms:  Excessive Worry, Psychotic Symptoms:  dnies PTSD Symptoms: Had a traumatic exposure:  childhood  Hyperarousal:  Difficulty Concentrating Increased Startle Response Sleep  Past Medical History:  Past Medical History  Diagnosis Date  . Headache   . Insomnia   . Herpes simplex type 1 infection   . Anxiety   . Arthritis   . Lower back pain   . BPH (benign prostatic hyperplasia)   . Dysphagia   . Morton's neuroma   . OSA (obstructive sleep apnea)   . Primary snoring   . Fibromyalgia   .  Hypertension     Past Surgical History  Procedure Laterality Date  . Pancreatic surgery r/t trauma    . Tonsillectomy    . Transurethral incision of prostate     Family History:  Family History  Problem Relation Age of Onset  . Coronary artery disease Mother   . Stroke Mother   . Hypertension Father   . Anxiety disorder Brother    Social History:    History   Social History  . Marital Status: Married    Spouse Name: N/A  . Number of Children: 2  . Years of Education: BS    Occupational History  . Government social research officer    Social History Main Topics  . Smoking status: Former Research scientist (life sciences)  . Smokeless tobacco: Not on file     Comment: Quit 2000   . Alcohol Use: 0.0 oz/week    0 Standard drinks or equivalent per week     Comment: Rare  . Drug Use: No  . Sexual Activity: Not on file   Other Topics Concern  . 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 On FMLA as of now   Musculoskeletal: Strength & Muscle Tone: within normal limits Gait & Station: normal Patient leans: no lean  Psychiatric Specialty Exam: Anxiety Presents for initial visit. Onset was more than 5 years ago. The problem has been waxing and waning. Symptoms include insomnia and nervous/anxious behavior. Patient reports no nausea. Symptoms occur most days. The quality of sleep is poor.   His past medical history is significant for anxiety/panic attacks. Past treatments include benzodiazephines, counseling (CBT) and non-benzodiazephine anxiolytics. The treatment provided no relief. Compliance with prior treatments has been variable.    Review of Systems  Constitutional: Positive for malaise/fatigue.  Cardiovascular: Negative.   Gastrointestinal: Negative for nausea.  Musculoskeletal: Positive for back pain.  Skin: Negative for rash.  Neurological: Negative for headaches.  Psychiatric/Behavioral: The patient is nervous/anxious and has insomnia.     Height 6' (1.829 m), weight 185 lb (83.915 kg).Body mass index is 25.08 kg/(m^2).  General Appearance: Casual  Eye Contact:  Fair  Speech:  Slow  Volume:  Normal  Mood:  Somewhat down but not worthless or suicidal  Affect:  Congruent  Thought Process:  Coherent  Orientation:  Full (Time, Place, and Person)  Thought  Content:  Rumination  Suicidal Thoughts:  No  Homicidal Thoughts:  No  Memory:  Immediate;   Fair Recent;   Fair  Judgement:  Fair  Insight:  Shallow  Psychomotor Activity:  Normal  Concentration:  Fair  Recall:  [place]: fair  Akathisia:  Negative  Handed:  Right  AIMS (if indicated):    Assets:  Desire for Improvement Financial Resources/Insurance Housing Social Support Talents/Skills Transportation Vocational/Educational  ADL's:  Intact  Cognition: WNL  Sleep:  Poor. 2 to 4 hours.     Allergies:  No Known Allergies Current Medications: Current Outpatient Prescriptions  Medication Sig Dispense Refill  . butalbital-acetaminophen-caffeine (FIORICET WITH CODEINE) 50-325-40-30 MG per capsule Take 1 capsule by mouth every 4 (four) hours as needed for headache.    . fesoterodine (TOVIAZ) 8 MG TB24 tablet Take 8 mg by mouth daily.    Marland Kitchen HYDROcodone-acetaminophen (NORCO/VICODIN) 5-325 MG per tablet Take 1 tablet by mouth every 6 (six) hours as needed. 10 tablet 0  . meloxicam (MOBIC) 15 MG tablet Take  15 mg by mouth daily.    . methocarbamol (ROBAXIN) 500 MG tablet Take 500 mg by mouth 4 (four) times daily.    Marland Kitchen omeprazole (PRILOSEC) 40 MG capsule Take 40 mg by mouth daily.    . pregabalin (LYRICA) 150 MG capsule Take 300 mg by mouth at bedtime.    . sertraline (ZOLOFT) 100 MG tablet Take 1 tablet (100 mg total) by mouth daily. 30 tablet 0  . temazepam (RESTORIL) 15 MG capsule Take one at night and one middle night on weekend nights. 60 capsule 1  . traZODone (DESYREL) 100 MG tablet Take one to two tablets at night for sleep. 30 tablet 1   No current facility-administered medications for this visit.    Previous Psychotropic Medications: Yes   Substance Abuse History in the last 12 months:  No.  Consequences of Substance Abuse: NA  Medical Decision Making:  Review of Psycho-Social Stressors (1), Review and summation of old records (2),  Established Problem, Worsening (2), Review of Medication Regimen & Side Effects (2) and Review of New Medication or Change in Dosage (2)  Treatment Plan Summary: Medication management and Plan as follows  1. Continue zoloft  100mg  for GAD, depression and possible PTSD 2. Consider temazepam in divided doses at night. 11 pm and 2 am. 15mg   3. On weekday nights trazadone 100 mg at 10 pm. Temazepam 15 at night 4. counselling for PTSD and sleep hygiene.  5. Went thru sleep hygiene and has sleep study cpap machine visit pending 6. Close follow with sleep specialist as this remains his main concern.  7. Neurology and other providers for their care relavant to sleep and medical conditions Follow up in 3 to 4 weeks.  Medication side effects, reviewed.  Call 911 or report to local ED for any urgent needs or suicidal concerns.    Latyra Jaye 6/23/20169:27 AM

## 2014-09-05 ENCOUNTER — Telehealth: Payer: Self-pay

## 2014-09-05 ENCOUNTER — Other Ambulatory Visit (HOSPITAL_COMMUNITY): Payer: Self-pay | Admitting: *Deleted

## 2014-09-05 MED ORDER — TEMAZEPAM 15 MG PO CAPS: ORAL_CAPSULE | ORAL | Status: DC

## 2014-09-05 NOTE — Telephone Encounter (Signed)
Received fax from pharmacy for direction clarification. Per Dr. De Nurse, pt will need to take Temazepam 15mg  at 11pm and 2am. Called and informed pharmacy of directions. Pharmacy will contact pt once prescription is ready.

## 2014-09-05 NOTE — Telephone Encounter (Signed)
I called patient because I noticed he was on schedule for Monday but is not due to see [place] until August. I left message on both numbers to see if his concerns are something we can handle over phone, or if he does in fact need appt.

## 2014-09-08 ENCOUNTER — Encounter: Payer: Self-pay | Admitting: Neurology

## 2014-09-08 ENCOUNTER — Ambulatory Visit (INDEPENDENT_AMBULATORY_CARE_PROVIDER_SITE_OTHER): Payer: BLUE CROSS/BLUE SHIELD | Admitting: Neurology

## 2014-09-08 VITALS — BP 124/82 | HR 62 | Resp 16 | Ht 72.0 in | Wt 190.0 lb

## 2014-09-08 DIAGNOSIS — G4733 Obstructive sleep apnea (adult) (pediatric): Secondary | ICD-10-CM | POA: Diagnosis not present

## 2014-09-08 NOTE — Patient Instructions (Signed)
We will start CPAP therapy for your obstructive sleep apnea.

## 2014-09-08 NOTE — Progress Notes (Signed)
Subjective:    Patient ID: Patrick [name] is a 64 y.o. male.  HPI     Interim history:   Patrick [name] is a 64-year-old right-handed gentleman with an underlying medical history of hypertension, reflux disease, migraine headaches, anxiety, arthritis, chronic low back pain, secondary to lumbar spondylosis and degenerative disc disease, and overweight state, who presents for follow-up consultation of his sleep disorder, after his sleep studies. The patient is unaccompanied today. I first met him on 07/29/2014 at the request of his primary care physician, at which time the patient reported excessive daytime somnolence, difficulty maintaining sleep, and a prior diagnosis of mild obstructive sleep apnea. I invited him back for sleep study. He had a baseline sleep study followed by a CPAP titration study. I talked to him about his sleep test results in detail today. He had a baseline sleep study on 08/17/2014. Sleep efficiency was 81.4% with a latency to sleep of 15 minutes and wake after sleep onset of 61 minutes with mild to moderate sleep fragmentation noted. The arousal index was elevated at 11 per hour, secondary to respiratory events. He had an increased percentage of light stage sleep, absence of slow-wave sleep and a normal percentage of REM sleep with a mildly prolonged REM latency of 140 minutes. He had no significant PLMS, EKG or EEG changes. Mild to moderate snoring was noted. He slept mostly on his sides. Total AHI was 15.8 per hour, rising to 46.2 per hour in the supine position. Average oxygen saturation was 91%, nadir was 80%. Based on the test results I invited him back for a second sleep study with CPAP titration. He had a CPAP titration study on 08/28/2014. Sleep efficiency was normal at 95.6% with a latency to sleep of 3 minutes and wake after sleep onset of 14 minutes with mild sleep fragmentation noted. He had a normal arousal index. He had a normal percentage of light stage sleep,  an increased percentage of slow-wave sleep, and a decreased percentage of REM sleep at 11% with a normal REM latency. He had no significant PLMS, EKG or EEG changes. CPAP was titrated from 5-12 cm. His AHI was 2.1 per hour on a pressure of 10 cm. Average oxygen saturation was 94%, nadir was 90%. Based on the test results I prescribed CPAP therapy for home use. The patient called in the interim and requested a follow-up appointment.   In the interim, he was seen by his psychiatrist, Dr. Akhtar for his insomnia, general anxiety disorder and possible PTSD. I reviewed Dr. Akhtar's note from 09/04/2014.  Today, 09/08/2014: He reports feeling about the same. He has just picked up his CPAP machine today. He will be starting it tonight. He feels groggy during the day. He was on short-term disability. He is wondering whether he will be getting his psychotropic medications from me.   Previously:  He reports excessive daytime somnolence as well as difficulty maintaining sleep. He has been tried on medications for sleep including temazepam and prior to that he was tried on Seroquel. This caused him daytime grogginess. Prior to that he tried Zolpidem. I reviewed your office note from 07/11/2014 as well as 05/15/2014, which you kindly included. Recent laboratory test results were reviewed as well. This was from 06/13/2014 and included a urinalysis which was negative, CBC with differential was unremarkable, lipid panel showed total cholesterol of 177, LDL of 102, CMP was unremarkable, TSH was normal.   He has had at least one sleep study in the past,   which showed mild OSA, and he tried CPAP, but did not like it and stopped using it. He lost about 30 lb and maintained the weight loss. He has a long-standing history of difficulty with sleep onset and sleep maintenance. Ambien helped him go to sleep but stopped working after some time. He is currently on temazepam 15 mg each night. He was diagnosed with fibromyalgia many  years ago. He has tried multiple medications for his fibromyalgia. He has started seeing a counselor and also has an appointment with a new psychiatrist in [place] on 08/15/2014 He tries to be in bed by 11 PM but has a hard time going to sleep. Sometimes he will wake up after one hour and has a difficult time going back to sleep. He has a family history of insomnia in his mother and his brother. His Epworth sleepiness score is 22 out of 24 today, fatigue score is 56 out of 63 today. He snores mildly. His wife has noted some gurgling noises while he is asleep. Previously he had issues with waking up with a gasping sensation but this was before he was able to lose weight. He does have mild restless leg symptoms but his leg discomfort does not keep him up. He has a history of leg twitching at night and his previous sleep study did show apparently some leg movements.   He does not drink caffeine. He stopped smoking over 10 years ago. He drinks alcohol occasionally.  His Past Medical History Is Significant For: Past Medical History  Diagnosis Date  . Headache   . Insomnia   . Herpes simplex type 1 infection   . Anxiety   . Arthritis   . Lower back pain   . BPH (benign prostatic hyperplasia)   . Dysphagia   . Morton's neuroma   . OSA (obstructive sleep apnea)   . Primary snoring   . Fibromyalgia   . Hypertension     His Past Surgical History Is Significant For: Past Surgical History  Procedure Laterality Date  . Pancreatic surgery r/t trauma    . Tonsillectomy    . Transurethral incision of prostate      His Family History Is Significant For: Family History  Problem Relation Age of Onset  . Coronary artery disease Mother   . Stroke Mother   . Hypertension Father   . Anxiety disorder Brother     His Social History Is Significant For: History   Social History  . Marital Status: Married    Spouse Name: N/A  . Number of Children: 2  . Years of Education: BS     Occupational History  . Government social research officer    Social History Main Topics  . Smoking status: Former Research scientist (life sciences)  . Smokeless tobacco: Not on file     Comment: Quit 2000   . Alcohol Use: 0.0 oz/week    0 Standard drinks or equivalent per week     Comment: Rare  . Drug Use: No  . Sexual Activity: Not on file   Other Topics Concern  . None   Social History Narrative   Denies caffeine use     His Allergies Are:  No Known Allergies:   His Current Medications Are:  Outpatient Encounter Prescriptions as of 09/08/2014  Medication Sig  . ALPRAZolam (XANAX) 0.25 MG tablet TK 1 T PO QHS PRN  . butalbital-acetaminophen-caffeine (FIORICET WITH CODEINE) 50-325-40-30 MG per capsule Take 1 capsule by mouth every 4 (four) hours as needed for headache.  Marland Kitchen  fesoterodine (TOVIAZ) 8 MG TB24 tablet Take 8 mg by mouth daily.  . HYDROcodone-acetaminophen (NORCO/VICODIN) 5-325 MG per tablet Take 1 tablet by mouth every 6 (six) hours as needed.  . meloxicam (MOBIC) 15 MG tablet Take 15 mg by mouth daily.  . methocarbamol (ROBAXIN) 500 MG tablet Take 500 mg by mouth 4 (four) times daily.  . omeprazole (PRILOSEC) 40 MG capsule Take 40 mg by mouth daily.  . pregabalin (LYRICA) 150 MG capsule Take 300 mg by mouth at bedtime.  . sertraline (ZOLOFT) 100 MG tablet Take 1 tablet (100 mg total) by mouth daily.  . temazepam (RESTORIL) 15 MG capsule Take one at 11pm and 2am.  . traZODone (DESYREL) 100 MG tablet Take one to two tablets at night for sleep.   No facility-administered encounter medications on file as of 09/08/2014.  : Review of Systems:  Out of a complete 14 point review of systems, all are reviewed and negative with the exception of these symptoms as listed below:   Review of Systems  Neurological:       Patient would like to discuss his sleep medications. States that they work well for him but is having trouble waking up. Feels groggy and sleepy during day.     Objective:  Neurologic  Exam  Physical Exam Physical Examination:   Filed Vitals:   09/08/14 1504  BP: 124/82  Pulse: 62  Resp: 16   General Examination: The patient is a very pleasant 63 y.o. male in no acute distress. He appears well-developed and well-nourished and well groomed. He is mildly anxious appearing. I did not do a full exam because we primarily went over his test results from his baseline sleep study from 08/17/2014 as well as his CPAP titration study from 08/28/2014. I explained the findings in detail and answered all his questions. Gait, station and balance: He stands easily. No veering to one side is noted. No leaning to one side is noted. Posture is age-appropriate and stance is narrow based. Gait shows normal stride length and normal pace. No problems turning are noted.    Assessment and Plan:  In summary, Masaichi W Vandevender is a very pleasant 63-year old male with an underlying medical history of hypertension, reflux disease, migraine headaches, anxiety, arthritis, chronic low back pain, secondary to lumbar spondylosis and degenerative disc disease, and overweight state, who presents for follow-up consultation of his obstructive sleep apnea, after his sleep studies. Today, we went over his sleep study results in detail. I explained all findings and also the differences between the 2 studies and how his sleep apnea improved after CPAP treatment. He has picked up his CPAP machine from his DME company today. He is willing to get started with treatment tonight. As far as his psychotropic medications, they will continue to come from his psychiatrist I explained. He is wondering whether he will continue with his short-term disability. I explained to him that from my end of things, having obstructive sleep apnea is not a justification for disability. I did advise him that treating his sleep apnea may have a positive impact on his daytime somnolence and his mood down the Road. He is advised to keep his  appointment with me in August for recheck of his obstructive sleep apnea, on treatment with CPAP therapy hopefully at the time. I answered all his questions today and he was in agreement. We talked about alternative treatments to CPAP therapy as well today. I encouraged him to be fully compliant with CPAP   treatment, not only for insurance purposes but primarily to improve His symptoms, and for the patient's long term health benefit, including to reduce His cardiovascular risks.

## 2014-10-23 ENCOUNTER — Ambulatory Visit (INDEPENDENT_AMBULATORY_CARE_PROVIDER_SITE_OTHER): Payer: BLUE CROSS/BLUE SHIELD | Admitting: Psychiatry

## 2014-10-23 ENCOUNTER — Other Ambulatory Visit (HOSPITAL_COMMUNITY): Payer: Self-pay | Admitting: Psychiatry

## 2014-10-23 ENCOUNTER — Encounter (HOSPITAL_COMMUNITY): Payer: Self-pay | Admitting: Psychiatry

## 2014-10-23 VITALS — BP 120/68 | HR 83 | Ht 72.0 in | Wt 191.0 lb

## 2014-10-23 DIAGNOSIS — G4700 Insomnia, unspecified: Secondary | ICD-10-CM | POA: Diagnosis not present

## 2014-10-23 DIAGNOSIS — F411 Generalized anxiety disorder: Secondary | ICD-10-CM

## 2014-10-23 DIAGNOSIS — F341 Dysthymic disorder: Secondary | ICD-10-CM

## 2014-10-23 MED ORDER — TRAZODONE HCL 100 MG PO TABS: ORAL_TABLET | ORAL | Status: DC

## 2014-10-23 MED ORDER — SERTRALINE HCL 100 MG PO TABS: 150.0000 mg | ORAL_TABLET | Freq: Every day | ORAL | Status: DC

## 2014-10-23 NOTE — Progress Notes (Signed)
Patient ID: Patrick [name], male   DOB: Aug 03, 1950, 64 y.o.   MRN: [id number]  Outpatient Follow up visit  Patient Identification: Patrick [name] MRN:  [id number] Date of Evaluation:  10/23/2014 Referral Source: Primary care  Chief Complaint:   Chief Complaint    Follow-up; Other     Visit Diagnosis:    ICD-9-CM ICD-10-CM   1. GAD (generalized anxiety disorder) 300.02 F41.1   2. Insomnia 780.52 G47.00   3. Dysthymia 300.4 F34.1    Diagnosis:   Patient Active Problem List   Diagnosis Date Noted  . Benign prostatic hyperplasia with urinary obstruction [N40.1] 11/18/2013  . Acid reflux [K21.9] 11/18/2013  . Degenerative arthritis of lumbar spine [M47.816] 11/18/2013  . Headache, migraine [G43.909] 11/18/2013  . Obstructive apnea [G47.33] 11/18/2013  . External hemorrhoids with other complication [X73.5] 32/99/2426  . Restless leg [G25.81] 05/27/2009   History of Present Illness:  Patient is a married middle-aged Caucasian male. Referred by primary care physician because of anxiety and insomnia This is return visit for follow up.   Insomnia : temazepam so he takes at 11pm and 2am. He has done at times alongwith taking tazadone. Sometimes takes one temazepam. . Sleep study done and is pending for Cpap machine  Anxiety : still worries, had dizzy spells now has been tested with blood work up and CT so far everything negative. This is effecting relationship wife worried about his health.  Primary care working on possibilty to rule out alzheimers.  Depression: zoloft 100mg  but still feels down and blah at times. Has added some handy work to keep busy at home but says it is difficult. Patient remains still pessimistic although not hopeless or suicidal. He says that he is not able to function at work because he remains foggy. Has to re use cpap. It didn't fit well.   Still has low-grade depression without hopelessness or suicidal. There is no reported side effects.  Does not  endorse manic symptoms currently or in the past no psychotic symptoms currently or in the past  Aggravating factors work stress, some concern about relationship says that is his difficult for the wife does not understand why he is also on any medication and his medical issues.  Modifying factors; his kids, he plays golf he does try to call side.  As using drugs he occasionally uses alcohol but not on a regular basis  Past psychiatric history;  No psychiatric admission or suicide attempt in the past. He has been on different medications past for insomnia  he has tried trazodone, seroquel 50mg . Ambien did help in the beginning but has not kept him sleep. He has been on Elavil and nortriptyline   Elements:  Location:  cannot sleep. Quality:  for many years. difficult going or mantaining sleep. Severity:  severe. effecting work and cannot funciton during day. Timing:  at night. Duration:  more then 10 years. Context:  job stress. poor sleep. multiple medical problems including fibromyalgia, arthritis, restless legs OSA. Associated Signs/Symptoms: Depression Symptoms:  anhedonia, insomnia, difficulty concentrating, anxiety, loss of energy/fatigue, (Hypo) Manic Symptoms:  Labiality of Mood, Anxiety Symptoms:  Excessive Worry, Psychotic Symptoms:  dnies PTSD Symptoms: Had a traumatic exposure:  childhood  Hyperarousal:  Difficulty Concentrating Increased Startle Response Sleep  Past Medical History:  Past Medical History  Diagnosis Date  . Headache   . Insomnia   . Herpes simplex type 1 infection   . Anxiety   . Arthritis   . Lower back pain   .  BPH (benign prostatic hyperplasia)   . Dysphagia   . Morton's neuroma   . OSA (obstructive sleep apnea)   . Primary snoring   . Fibromyalgia   . Hypertension     Past Surgical History  Procedure Laterality Date  . Pancreatic surgery r/t trauma    . Tonsillectomy    . Transurethral incision of prostate     Family History:   Family History  Problem Relation Age of Onset  . Coronary artery disease Mother   . Stroke Mother   . Hypertension Father   . Anxiety disorder Brother    Social History:   Social History   Social History  . Marital Status: Married    Spouse Name: N/A  . Number of Children: 2  . Years of Education: BS    Occupational History  . Government social research officer    Social History Main Topics  . Smoking status: Former Research scientist (life sciences)  . Smokeless tobacco: None     Comment: Quit 2000   . Alcohol Use: 0.0 oz/week    0 Standard drinks or equivalent per week     Comment: Rare  . Drug Use: No  . Sexual Activity: Not Asked   Other Topics Concern  . 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 On FMLA as of now   Musculoskeletal: Strength & Muscle Tone: within normal limits Gait & Station: normal Patient leans: no lean  Psychiatric Specialty Exam: Anxiety Presents for initial visit. Onset was more than 5 years ago. The problem has been waxing and waning. Symptoms include insomnia and nervous/anxious behavior. Patient reports no nausea. Symptoms occur most days. The quality of sleep is poor.   His past medical history is significant for anxiety/panic attacks. Past treatments include benzodiazephines, counseling (CBT) and non-benzodiazephine anxiolytics. The treatment provided no relief. Compliance with prior treatments has been variable.    Review of Systems  Constitutional: Positive for malaise/fatigue. Negative for fever.  Cardiovascular: Negative.   Gastrointestinal: Negative for nausea.  Musculoskeletal: Positive for back pain.  Skin: Negative for rash.  Neurological: Negative for headaches.  Psychiatric/Behavioral: Positive for depression. The patient is nervous/anxious and has insomnia.     Blood pressure 120/68, pulse 83, height 6' (1.829 m), weight 191 lb (86.637 kg), SpO2 92 %.Body mass index  is 25.9 kg/(m^2).  General Appearance: Casual  Eye Contact:  Fair  Speech:  Slow  Volume:  Normal  Mood:  Somewhat down but not worthless or suicidal  Affect:  Congruent  Thought Process:  Coherent  Orientation:  Full (Time, Place, and Person)  Thought Content:  Rumination  Suicidal Thoughts:  No  Homicidal Thoughts:  No  Memory:  Immediate;   Fair Recent;   Fair  Judgement:  Fair  Insight:  Shallow  Psychomotor Activity:  Normal  Concentration:  Fair  Recall:  [place]: fair  Akathisia:  Negative  Handed:  Right  AIMS (if indicated):    Assets:  Desire for Improvement Financial Resources/Insurance Housing Social Support Talents/Skills Transportation Vocational/Educational  ADL's:  Intact  Cognition: WNL  Sleep:  Poor. 2 to 4 hours.     Allergies:  No Known Allergies Current Medications: Current Outpatient Prescriptions  Medication Sig Dispense Refill  . ALPRAZolam (XANAX) 0.25 MG tablet TK 1 T PO QHS PRN  0  . butalbital-acetaminophen-caffeine (FIORICET WITH CODEINE) 50-325-40-30 MG per capsule Take 1  capsule by mouth every 4 (four) hours as needed for headache.    . fesoterodine (TOVIAZ) 8 MG TB24 tablet Take 8 mg by mouth daily.    Marland Kitchen HYDROcodone-acetaminophen (NORCO/VICODIN) 5-325 MG per tablet Take 1 tablet by mouth every 6 (six) hours as needed. 10 tablet 0  . meloxicam (MOBIC) 15 MG tablet Take 15 mg by mouth daily.    . methocarbamol (ROBAXIN) 500 MG tablet Take 500 mg by mouth 4 (four) times daily.    Marland Kitchen omeprazole (PRILOSEC) 40 MG capsule Take 40 mg by mouth daily.    . pregabalin (LYRICA) 150 MG capsule Take 300 mg by mouth at bedtime.    . sertraline (ZOLOFT) 100 MG tablet Take 1.5 tablets (150 mg total) by mouth daily. 45 tablet 1  . temazepam (RESTORIL) 15 MG capsule Take one at 11pm and 2am. 60 capsule 1  . traZODone (DESYREL) 100 MG tablet Take one to two tablets at night for sleep. 30 tablet 1  . predniSONE (DELTASONE) 10  MG tablet   0   No current facility-administered medications for this visit.    Previous Psychotropic Medications: Yes   Substance Abuse History in the last 12 months:  No.  Consequences of Substance Abuse: NA  Medical Decision Making:  Review of Psycho-Social Stressors (1), Review and summation of old records (2), Established Problem, Worsening (2), Review of Medication Regimen & Side Effects (2) and Review of New Medication or Change in Dosage (2)  Treatment Plan Summary: Medication management and Plan as follows  1. Increase zoloft  150 mg for GAD, depression and possible PTSD 2. temazepam in divided doses at night. 11 pm and 2 am. 15mg  . Sometimes he takes one and combine with trazadone  3. On weekday nights trazadone 100 mg at 10 pm. Temazepam 15 at night 4. counselling for PTSD and sleep hygiene.  5. Went thru sleep hygiene and has sleep study cpap machine adjustment.  6. Close follow with sleep specialist as this remains his main concern.  7. Neurology and other providers for their care relavant to sleep and medical conditions, including his dizzy spells.  Follow up in 3 to 4 weeks.  Medication side effects, reviewed.  Call 911 or report to local ED for any urgent needs or suicidal concerns.    Adean Milosevic 8/11/20169:20 AM

## 2014-10-24 NOTE — Telephone Encounter (Signed)
PT was seen on 10/23/14. Prescription for Trazodone was sent to pharmacy. Pt has a f/u appt 11/20/14.

## 2014-10-27 ENCOUNTER — Telehealth: Payer: Self-pay

## 2014-10-27 NOTE — Telephone Encounter (Signed)
I spoke to Patrick [name] and reminded him to bring his CPAP machine to [facility] tomorrow so we can get recent down load.

## 2014-10-28 ENCOUNTER — Encounter: Payer: Self-pay | Admitting: Neurology

## 2014-10-28 ENCOUNTER — Ambulatory Visit (INDEPENDENT_AMBULATORY_CARE_PROVIDER_SITE_OTHER): Payer: Self-pay | Admitting: Neurology

## 2014-10-28 VITALS — BP 150/96 | HR 69 | Resp 16 | Ht 72.0 in | Wt 188.0 lb

## 2014-10-28 DIAGNOSIS — G4733 Obstructive sleep apnea (adult) (pediatric): Secondary | ICD-10-CM

## 2014-10-28 NOTE — Progress Notes (Signed)
Subjective:    Patient ID: Patrick [name] is a 64 y.o. male.  HPI   Patient here for FU for OSA on CPAP. He has not had a download, machine has no chip in it, his DME provider has not sent him a chip yet. Machine is older and has no modem in it, not accessible through [facility]. Will have to forego appt and reschedule. I apologized for his long wait time today and of course will not bill him for today's visit. I did not do a full exam. He is reminded to try to be compliant with CPAP use. I did advise that sores in his mouth are unlikely from CPAP use. He is reminded to use the humidifier and keep his mask and equipment clean.  Review of Systems  HENT: Positive for tinnitus.   Gastrointestinal: Positive for nausea.  Neurological: Positive for dizziness.       Patient feels like he is getting congestion and canker sores in his mouth with CPAP use. He states that the CPAP is causing more anxiety for him.  New onset of dizziness and nausea, feels like he is unable to drive.  Patient states that he sometimes has a loud popping in his head right before he falls asleep.     Objective:  Neurologic Exam  Physical Exam  Assessment:     Plan:

## 2014-11-04 ENCOUNTER — Ambulatory Visit: Payer: Self-pay | Admitting: Family Medicine

## 2014-11-04 ENCOUNTER — Ambulatory Visit (HOSPITAL_COMMUNITY): Payer: Self-pay | Admitting: Psychiatry

## 2014-11-20 ENCOUNTER — Ambulatory Visit (HOSPITAL_COMMUNITY): Payer: Self-pay | Admitting: Psychiatry

## 2014-11-24 ENCOUNTER — Telehealth: Payer: Self-pay | Admitting: Neurology

## 2014-11-24 DIAGNOSIS — G4733 Obstructive sleep apnea (adult) (pediatric): Secondary | ICD-10-CM

## 2014-11-24 NOTE — Telephone Encounter (Signed)
Please call him back: Please ask patient to contact his DME company as well. He may get some pointers as to how to improve his tolerance to the mask and the pressure. Will be able to get more details regarding cleaning the mask and improving congestion symptoms.

## 2014-11-24 NOTE — Telephone Encounter (Signed)
See phone note

## 2014-11-24 NOTE — Telephone Encounter (Signed)
Patient called regarding CPAP. "Doesn't feel like it's working. Head gets congested. Feels like head is going to explode. Got a cold went into his chest. This happens everytime he uses it. He cleans the machine, sterilizes it. Feels worse when he uses it. Wonders if maybe he should see another Tax adviser."

## 2014-11-25 NOTE — Telephone Encounter (Signed)
I sent a message to Webb Silversmith with [facility] to help with this patient ASAP.

## 2014-11-25 NOTE — Telephone Encounter (Signed)
I sent an urgent request to [facility] at [facility] to contact the patient. She has already replied and said that she will have someone contact him.  I spoke to the patient and advised him of recommendations below. He states that he does not think that "CPAP is for me". Reports that he had the same issues with cold symptoms and claustrophobia when he used CPAP years ago. Patrick [name] insists on meeting with another doctor for a second opinion and discuss alternative treatment. Is it ok with you to schedule with Dr. Brett Fairy?

## 2014-11-26 NOTE — Telephone Encounter (Signed)
I spoke to [employer]. He does not want to go to [facility]. He states that he went to [facility] before coming here and was not happy with the service. I advised him that I will talk to Dr. Rexene Alberts about what the next step is.

## 2014-11-26 NOTE — Telephone Encounter (Signed)
Patient is returning your call. He would like to discuss referring to another doctor. Thank you.

## 2014-11-26 NOTE — Telephone Encounter (Signed)
Left message to call back and let us know if he would like [place] to refer him to [facility] in [facility] for sleep consult/2nd opinion. Or we can refer to [facility] as well.

## 2014-11-26 NOTE — Telephone Encounter (Signed)
Per Dr. Rexene Alberts, if ok with patient, we will refer to [facility] for second opinion.

## 2014-11-26 NOTE — Telephone Encounter (Signed)
See other message

## 2014-12-01 ENCOUNTER — Telehealth: Payer: Self-pay

## 2014-12-01 NOTE — Telephone Encounter (Signed)
I called Patrick [name] to give him an update on getting a second opinion with Dr. Brett Fairy. he asked to call me back because he was on the other line.   I was going to advise him that we will speak to Dr. Brett Fairy when she gets back about scheduling a second opinion with her.

## 2014-12-01 NOTE — Telephone Encounter (Signed)
Tim called back and I gave him the status of getting second opinion. He voiced appreciation. I will talk to Cyril Mourning today about setting this up.

## 2014-12-02 NOTE — Telephone Encounter (Signed)
Is Dr. Brett Fairy able to see this patient for a second opinion?

## 2014-12-03 NOTE — Telephone Encounter (Signed)
Dr. Brett Fairy declined.

## 2014-12-04 NOTE — Addendum Note (Signed)
Addended by: Star Age on: 12/04/2014 02:41 PM   Modules accepted: Orders

## 2014-12-04 NOTE — Telephone Encounter (Signed)
Patrick [name] has spoke to patient. What do you recommend?

## 2014-12-04 NOTE — Telephone Encounter (Signed)
I spoke to patient and I advised him that I am faxing his Referral to [facility]. He voiced understanding.

## 2014-12-04 NOTE — Telephone Encounter (Signed)
I spoke with Marcie Bal after she had a telephone conversation with the patient. He wishes to explore surgical treatment options for sleep apnea as he feels he is intolerant to CPAP. I will place an order for referral to ENT. Please process. Please make sure you include all office notes and sleep study results as well as compliance data if available. Thanks and please let patient know. Thanks!

## 2014-12-04 NOTE — Telephone Encounter (Signed)
Dr. Rexene Alberts is aware and asked for Marcie Bal (office manager) to speak with patient and discuss next step.

## 2014-12-05 NOTE — Telephone Encounter (Signed)
Patrick [name] with the [facility] is calling regarding the referral. She states Dr. Thornell Mule does not see obstructive sleep problems only ear, nose and throat problems.

## 2014-12-08 ENCOUNTER — Telehealth: Payer: Self-pay

## 2014-12-08 NOTE — Telephone Encounter (Signed)
I spoke to Patrick [name] to let him know that we have to re-send his referral. [facility] does not treat sleep apnea patients. We have sent referral to Dr. Pollie Friar office, I confirmed that they do treat sleep apnea patients. Patrick [name] voices understanding. I asked him to call them for appt since I do not know his schedule. He agreed to. Records were sent to Dr. Pollie Friar office.

## 2014-12-08 NOTE — Telephone Encounter (Signed)
Please see other note. I have sent referral to Dr. Pollie Friar office and patient is aware.

## 2015-01-02 ENCOUNTER — Other Ambulatory Visit: Payer: Self-pay | Admitting: Otolaryngology

## 2015-01-02 NOTE — H&P (Signed)
PREOPERATIVE H&P  Chief Complaint: OSA  HPI: Patrick [name] is a 64 y.o. male who presents for evaluation of OSA referred by [facility]. He's tried the nasal CPAP but does not tolerate it well secondary to nasal obstruction. His last sleep test showed AHI of 16 and O2 sats down to 80%. He's taken to the OR for turbinate reductions, possible septoplasty and UPPP to help improve his nasal obstruction and sleep apnea.  Past Medical History  Diagnosis Date  . Headache   . Insomnia   . Herpes simplex type 1 infection   . Anxiety   . Arthritis   . Lower back pain   . BPH (benign prostatic hyperplasia)   . Dysphagia   . Morton's neuroma   . OSA (obstructive sleep apnea)   . Primary snoring   . Fibromyalgia   . Hypertension    Past Surgical History  Procedure Laterality Date  . Pancreatic surgery r/t trauma    . Tonsillectomy    . Transurethral incision of prostate     Social History   Social History  . Marital Status: Married    Spouse Name: N/A  . Number of Children: 2  . Years of Education: BS    Occupational History  . Government social research officer    Social History Main Topics  . Smoking status: Former Research scientist (life sciences)  . Smokeless tobacco: Not on file     Comment: Quit 2000   . Alcohol Use: 0.0 oz/week    0 Standard drinks or equivalent per week     Comment: Rare  . Drug Use: No  . Sexual Activity: Not on file   Other Topics Concern  . Not on file   Social History Narrative   Denies caffeine use    Family History  Problem Relation Age of Onset  . Coronary artery disease Mother   . Stroke Mother   . Hypertension Father   . Anxiety disorder Brother    No Known Allergies Prior to Admission medications   Medication Sig Start Date End Date Taking? Authorizing Provider  ALPRAZolam (XANAX) 0.25 MG tablet TK 1 T PO QHS PRN 08/25/14   Historical Provider, MD  butalbital-acetaminophen-caffeine (FIORICET WITH CODEINE) 50-325-40-30 MG per capsule Take 1 capsule by mouth  every 4 (four) hours as needed for headache.    Historical Provider, MD  fesoterodine (TOVIAZ) 8 MG TB24 tablet Take 8 mg by mouth daily.    Historical Provider, MD  HYDROcodone-acetaminophen (NORCO/VICODIN) 5-325 MG per tablet Take 1 tablet by mouth every 6 (six) hours as needed. 09/24/13   Merryl Hacker, MD  meloxicam (MOBIC) 15 MG tablet Take 15 mg by mouth daily.    Historical Provider, MD  methocarbamol (ROBAXIN) 500 MG tablet Take 500 mg by mouth 4 (four) times daily.    Historical Provider, MD  omeprazole (PRILOSEC) 40 MG capsule Take 40 mg by mouth daily.    Historical Provider, MD  predniSONE (DELTASONE) 10 MG tablet  09/23/14   Historical Provider, MD  pregabalin (LYRICA) 150 MG capsule Take 300 mg by mouth at bedtime.    Historical Provider, MD  sertraline (ZOLOFT) 100 MG tablet Take 1.5 tablets (150 mg total) by mouth daily. 10/23/14   Merian Capron, MD  temazepam (RESTORIL) 15 MG capsule Take one at 11pm and 2am. 09/05/14   Merian Capron, MD  traZODone (DESYREL) 100 MG tablet Take one to two tablets at night for sleep. 10/23/14   Merian Capron, MD     Positive  ROS: excessive daytime somnolence and trouble sleeping at night  All other systems have been reviewed and were otherwise negative with the exception of those mentioned in the HPI and as above.  Physical Exam: There were no vitals filed for this visit.  General: Alert, no acute distress Oral: Normal oral mucosa and tonsils, moderate long uvula and soft palate Nasal: Moderate septal deformity and turbinate hypertrophy, no polyps or obstructing lesions Neck: No palpable adenopathy or thyroid nodules Ear: Ear canal is clear with normal appearing TMs Cardiovascular: Regular rate and rhythm, no murmur.  Respiratory: Clear to auscultation Neurologic: Alert and oriented x 3   Assessment/Plan: SLEEP APNEA DEVATED SEPTUM Plan for Procedure(s): SEPTOPLASTY BILATERAL TURBINATE REDUCTION UVULOPALATOPHARYNGOPLASTY  (UPPP)   Melony Overly, MD 01/02/2015 3:38 PM

## 2015-01-05 ENCOUNTER — Encounter (HOSPITAL_COMMUNITY): Payer: Self-pay

## 2015-01-05 ENCOUNTER — Inpatient Hospital Stay (HOSPITAL_COMMUNITY)
Admission: RE | Admit: 2015-01-05 | Discharge: 2015-01-05 | Disposition: A | Discharge status: Home / Self Care | Payer: Self-pay | Source: Ambulatory Visit

## 2015-01-05 HISTORY — DX: Frequency of micturition: R35.0

## 2015-02-03 ENCOUNTER — Encounter (HOSPITAL_COMMUNITY)
Admission: RE | Admit: 2015-02-03 | Discharge: 2015-02-03 | Disposition: A | Discharge status: Home / Self Care | Payer: BLUE CROSS/BLUE SHIELD | Source: Ambulatory Visit | Attending: Otolaryngology | Admitting: Otolaryngology

## 2015-02-03 ENCOUNTER — Telehealth: Payer: Self-pay | Admitting: *Deleted

## 2015-02-03 ENCOUNTER — Encounter (HOSPITAL_COMMUNITY): Payer: Self-pay

## 2015-02-03 DIAGNOSIS — J342 Deviated nasal septum: Secondary | ICD-10-CM | POA: Diagnosis not present

## 2015-02-03 DIAGNOSIS — G4730 Sleep apnea, unspecified: Secondary | ICD-10-CM | POA: Diagnosis not present

## 2015-02-03 DIAGNOSIS — Z01818 Encounter for other preprocedural examination: Secondary | ICD-10-CM | POA: Insufficient documentation

## 2015-02-03 DIAGNOSIS — Z01812 Encounter for preprocedural laboratory examination: Secondary | ICD-10-CM | POA: Insufficient documentation

## 2015-02-03 HISTORY — DX: Depression, unspecified: F32.A

## 2015-02-03 HISTORY — DX: Major depressive disorder, single episode, unspecified: F32.9

## 2015-02-03 HISTORY — DX: Recurrent oral aphthae: K12.0

## 2015-02-03 HISTORY — DX: Gastro-esophageal reflux disease without esophagitis: K21.9

## 2015-02-03 LAB — CBC
HCT: 45.1 % (ref 39.0–52.0)
Hemoglobin: 15.2 g/dL (ref 13.0–17.0)
MCH: 31.3 pg (ref 26.0–34.0)
MCHC: 33.7 g/dL (ref 30.0–36.0)
MCV: 93 fL (ref 78.0–100.0)
Platelets: 152 10*3/uL (ref 150–400)
RBC: 4.85 MIL/uL (ref 4.22–5.81)
RDW: 12.5 % (ref 11.5–15.5)
WBC: 5.7 10*3/uL (ref 4.0–10.5)

## 2015-02-03 LAB — BASIC METABOLIC PANEL
Anion gap: 8 (ref 5–15)
BUN: 14 mg/dL (ref 6–20)
CHLORIDE: 107 mmol/L (ref 101–111)
CO2: 25 mmol/L (ref 22–32)
CREATININE: 1.07 mg/dL (ref 0.61–1.24)
Calcium: 9.2 mg/dL (ref 8.9–10.3)
Glucose, Bld: 83 mg/dL (ref 65–99)
POTASSIUM: 4.4 mmol/L (ref 3.5–5.1)
SODIUM: 140 mmol/L (ref 135–145)

## 2015-02-03 NOTE — Pre-Procedure Instructions (Signed)
    [facility]  02/03/2015      [facility] - [place], [place] - [address] [place] [address] Phone: [phone] Fax: [phone]    Your procedure is scheduled on 02-09-2015   Monday    Report to [facility] Admitting at 5:30 A.M.   Call this number if you have problems the morning of surgery:  [phone]   Remember:  Do not eat food or drink liquids after midnight.   Take these medicines the morning of surgery with A SIP OF WATER clonazepam(Klonopin),Toviaz,pain medication if needed,Sertraline(Zoloft)   Do not wear jewelr  Do not wear lotions, powders, or perfumes.    Do not shave 48 hours prior to surgery.  Men may shave face and neck.   Do not bring valuables to the hospital.  [facility] is not responsible for any belongings or valuables.  Contacts, dentures or bridgework may not be worn into surgery.  Leave your suitcase in the car.  After surgery it may be brought to your room.  For patients admitted to the hospital, discharge time will be determined by your treatment team.  Patients discharged the day of surgery will not be allowed to drive home.    Special instructions:  See attached Sheet for instructions on CHG showers  Please read over the following fact sheets that you were given. Pain Booklet and Surgical Site Infection Prevention

## 2015-02-03 NOTE — Telephone Encounter (Signed)
Patient records fax to [facility] on 02/03/15.

## 2015-02-08 MED ORDER — CEFAZOLIN SODIUM-DEXTROSE 2-3 GM-% IV SOLR
2.0000 g | INTRAVENOUS | Status: AC
  Administered 2015-02-09: 2 g via INTRAVENOUS
  Filled 2015-02-08: qty 50

## 2015-02-08 NOTE — H&P (Signed)
PREOPERATIVE H&P  Chief Complaint: Obstructive Sleep Apnea  HPI: Patrick [name] is a 64 y.o. male who presents for evaluation of OSA. He complains of excessive day time somnolence as well as some difficulty sleeping at night. He had a sleep test several years ago that showed OSA and was tried on nasal CPAP but didn't tolerate it well. He lost weight and had another sleep test recently that again showed OSA with AHI of 16 and 46 in the supine position. O2 sat went down to a low of 80%. He again was recommended nasal CPAP but didn't tolerate it secondary to nasal obstruction. He's taken to the OR at this time for surgical intervention to improve his nasal obstruction and OSA.  Past Medical History  Diagnosis Date  . Insomnia   . Herpes simplex type 1 infection   . Anxiety   . Arthritis   . Lower back pain   . BPH (benign prostatic hyperplasia)   . Dysphagia   . Morton's neuroma   . OSA (obstructive sleep apnea)   . Primary snoring   . Fibromyalgia   . Urinary frequency   . Hypertension     no longer on medication  . Depression   . Headache     migraines  . GERD (gastroesophageal reflux disease)     otc  tums  . Recurrent canker sores    Past Surgical History  Procedure Laterality Date  . Pancreatic surgery r/t trauma    . Tonsillectomy    . Transurethral incision of prostate    . Mouth surgery    . Foot surgery Left   . Eye surgery Bilateral     lasik eye surgery   Social History   Social History  . Marital Status: Married    Spouse Name: N/A  . Number of Children: 2  . Years of Education: BS    Occupational History  . Government social research officer    Social History Main Topics  . Smoking status: Former Smoker -- 2.00 packs/day for 30 years    Types: Cigarettes    Quit date: 09/01/2014  . Smokeless tobacco: Not on file  . Alcohol Use: 0.0 oz/week    0 Standard drinks or equivalent per week     Comment: Rare  . Drug Use: No  . Sexual Activity: Not on file   Other  Topics Concern  . Not on file   Social History Narrative   Denies caffeine use    Family History  Problem Relation Age of Onset  . Coronary artery disease Mother   . Stroke Mother   . Hypertension Father   . Anxiety disorder Brother    No Known Allergies Prior to Admission medications   Medication Sig Start Date End Date Taking? Authorizing Provider  butalbital-acetaminophen-caffeine (FIORICET WITH CODEINE) 50-325-40-30 MG per capsule Take 1 capsule by mouth every 4 (four) hours as needed for headache.    Historical Provider, MD  chlorhexidine (PERIDEX) 0.12 % solution 15 mLs 2 (two) times daily. 01/15/15   Historical Provider, MD  Cholecalciferol (VITAMIN D3) 2000 UNITS TABS Take 2,000 Units by mouth daily.    Historical Provider, MD  clindamycin (CLEOCIN) 150 MG capsule Take 150 mg by mouth 2 (two) times daily.    Historical Provider, MD  clonazePAM (KLONOPIN) 0.5 MG tablet Take 0.5 mg by mouth 2 (two) times daily. 12/29/14   Historical Provider, MD  fesoterodine (TOVIAZ) 8 MG TB24 tablet Take 8 mg by mouth daily.  Historical Provider, MD  fluocinonide ointment (LIDEX) [id number] % Apply 1 application topically 4 (four) times daily.  01/13/15   Historical Provider, MD  HYDROcodone-acetaminophen (NORCO/VICODIN) 5-325 MG per tablet Take 1 tablet by mouth every 6 (six) hours as needed. Patient taking differently: Take 1 tablet by mouth daily as needed for moderate pain.  09/24/13   Merryl Hacker, MD  LIDOCAINE EX 1 application by Mouth Rinse route 4 (four) times daily as needed (for mouth sores).    Historical Provider, MD  Melatonin 5 MG TABS Take 5 mg by mouth at bedtime.    Historical Provider, MD  meloxicam (MOBIC) 15 MG tablet Take 15 mg by mouth daily.    Historical Provider, MD  pregabalin (LYRICA) 150 MG capsule Take 300 mg by mouth at bedtime.    Historical Provider, MD  sertraline (ZOLOFT) 100 MG tablet Take 1.5 tablets (150 mg total) by mouth daily. Patient taking differently:  Take 200 mg by mouth daily.  10/23/14   Merian Capron, MD  temazepam (RESTORIL) 15 MG capsule Take one at 11pm and 2am. Patient not taking: Reported on 02/02/2015 09/05/14   Merian Capron, MD  traZODone (DESYREL) 100 MG tablet Take one to two tablets at night for sleep. Patient taking differently: Take 50 mg by mouth at bedtime. Take one to two tablets at night for sleep. 10/23/14   Merian Capron, MD  vitamin B-12 (CYANOCOBALAMIN) 500 MCG tablet Take 500 mcg by mouth daily.    Historical Provider, MD     Positive ROS: per HPI  All other systems have been reviewed and were otherwise negative with the exception of those mentioned in the HPI and as above.  Physical Exam: There were no vitals filed for this visit.  General: Alert, no acute distress Oral: Normal oral mucosa and tonsils 2+, long uvula Nasal: moderate septal deviation and large turbinates. No polyps. Neck: No palpable adenopathy or thyroid nodules Ear: Ear canal is clear with normal appearing TMs Cardiovascular: Regular rate and rhythm, no murmur.  Respiratory: Clear to auscultation Neurologic: Alert and oriented x 3   Assessment/Plan: SLEEP APNEA DEVATED SEPTUM Plan for Procedure(s): SEPTOPLASTY BILATERAL TURBINATE REDUCTION UVULOPALATOPHARYNGOPLASTY (UPPP)   Melony Overly, MD 02/08/2015 9:20 PM

## 2015-02-09 ENCOUNTER — Observation Stay (HOSPITAL_COMMUNITY)
Admission: RE | Admit: 2015-02-09 | Discharge: 2015-02-10 | Disposition: A | Discharge status: Home / Self Care | Payer: BLUE CROSS/BLUE SHIELD | Source: Ambulatory Visit | Attending: Otolaryngology | Admitting: Otolaryngology

## 2015-02-09 ENCOUNTER — Ambulatory Visit (HOSPITAL_COMMUNITY): Payer: BLUE CROSS/BLUE SHIELD | Admitting: Anesthesiology

## 2015-02-09 ENCOUNTER — Encounter (HOSPITAL_COMMUNITY): Payer: Self-pay | Admitting: *Deleted

## 2015-02-09 ENCOUNTER — Encounter (HOSPITAL_COMMUNITY)
Admission: RE | Disposition: A | Discharge status: Home / Self Care | Payer: Self-pay | Source: Ambulatory Visit | Attending: Otolaryngology

## 2015-02-09 DIAGNOSIS — R400 Somnolence: Secondary | ICD-10-CM | POA: Diagnosis not present

## 2015-02-09 DIAGNOSIS — Z79899 Other long term (current) drug therapy: Secondary | ICD-10-CM | POA: Diagnosis not present

## 2015-02-09 DIAGNOSIS — Z791 Long term (current) use of non-steroidal anti-inflammatories (NSAID): Secondary | ICD-10-CM | POA: Insufficient documentation

## 2015-02-09 DIAGNOSIS — Z7952 Long term (current) use of systemic steroids: Secondary | ICD-10-CM | POA: Insufficient documentation

## 2015-02-09 DIAGNOSIS — M797 Fibromyalgia: Secondary | ICD-10-CM | POA: Insufficient documentation

## 2015-02-09 DIAGNOSIS — G4733 Obstructive sleep apnea (adult) (pediatric): Secondary | ICD-10-CM | POA: Diagnosis not present

## 2015-02-09 DIAGNOSIS — Z87891 Personal history of nicotine dependence: Secondary | ICD-10-CM | POA: Insufficient documentation

## 2015-02-09 DIAGNOSIS — F419 Anxiety disorder, unspecified: Secondary | ICD-10-CM | POA: Insufficient documentation

## 2015-02-09 DIAGNOSIS — I10 Essential (primary) hypertension: Secondary | ICD-10-CM | POA: Insufficient documentation

## 2015-02-09 DIAGNOSIS — M1990 Unspecified osteoarthritis, unspecified site: Secondary | ICD-10-CM | POA: Diagnosis not present

## 2015-02-09 DIAGNOSIS — G4730 Sleep apnea, unspecified: Secondary | ICD-10-CM | POA: Diagnosis present

## 2015-02-09 DIAGNOSIS — J342 Deviated nasal septum: Secondary | ICD-10-CM | POA: Diagnosis not present

## 2015-02-09 DIAGNOSIS — K219 Gastro-esophageal reflux disease without esophagitis: Secondary | ICD-10-CM | POA: Diagnosis not present

## 2015-02-09 HISTORY — PX: UVULOPALATOPHARYNGOPLASTY: SHX827

## 2015-02-09 HISTORY — PX: SEPTOPLASTY: SHX2393

## 2015-02-09 HISTORY — PX: TURBINATE REDUCTION: SHX6157

## 2015-02-09 SURGERY — SEPTOPLASTY, NOSE
Anesthesia: General

## 2015-02-09 MED ORDER — CEFAZOLIN SODIUM 1-5 GM-% IV SOLN
1.0000 g | Freq: Three times a day (TID) | INTRAVENOUS | Status: DC
  Administered 2015-02-09 – 2015-02-10 (×3): 1 g via INTRAVENOUS
  Filled 2015-02-09 (×5): qty 50

## 2015-02-09 MED ORDER — PHENOL 1.4 % MT LIQD
1.0000 | OROMUCOSAL | Status: DC | PRN
  Administered 2015-02-09: 1 via OROMUCOSAL
  Filled 2015-02-09: qty 177

## 2015-02-09 MED ORDER — HYDROCODONE-ACETAMINOPHEN 7.5-325 MG/15ML PO SOLN
10.0000 mL | ORAL | Status: DC | PRN
  Administered 2015-02-09 – 2015-02-10 (×4): 15 mL via ORAL
  Filled 2015-02-09 (×4): qty 15

## 2015-02-09 MED ORDER — MORPHINE SULFATE (PF) 2 MG/ML IV SOLN
2.0000 mg | INTRAVENOUS | Status: DC | PRN
  Administered 2015-02-09 – 2015-02-10 (×4): 2 mg via INTRAVENOUS
  Filled 2015-02-09 (×5): qty 1

## 2015-02-09 MED ORDER — LACTATED RINGERS IV SOLN
INTRAVENOUS | Status: DC | PRN
  Administered 2015-02-09 (×2): via INTRAVENOUS

## 2015-02-09 MED ORDER — PROPOFOL 10 MG/ML IV BOLUS
INTRAVENOUS | Status: DC | PRN
  Administered 2015-02-09: 180 mg via INTRAVENOUS

## 2015-02-09 MED ORDER — HYDROMORPHONE HCL 1 MG/ML IJ SOLN
INTRAMUSCULAR | Status: AC
  Filled 2015-02-09: qty 1

## 2015-02-09 MED ORDER — DEXAMETHASONE SODIUM PHOSPHATE 10 MG/ML IJ SOLN
INTRAMUSCULAR | Status: AC
  Filled 2015-02-09: qty 1

## 2015-02-09 MED ORDER — ONDANSETRON HCL 4 MG/2ML IJ SOLN
INTRAMUSCULAR | Status: AC
  Filled 2015-02-09: qty 2

## 2015-02-09 MED ORDER — FENTANYL CITRATE (PF) 100 MCG/2ML IJ SOLN
INTRAMUSCULAR | Status: DC | PRN
  Administered 2015-02-09: 50 ug via INTRAVENOUS
  Administered 2015-02-09: 150 ug via INTRAVENOUS
  Administered 2015-02-09: 50 ug via INTRAVENOUS

## 2015-02-09 MED ORDER — LIDOCAINE-EPINEPHRINE 1 %-1:100000 IJ SOLN
INTRAMUSCULAR | Status: DC | PRN
  Administered 2015-02-09: 20 mL

## 2015-02-09 MED ORDER — ROCURONIUM BROMIDE 100 MG/10ML IV SOLN
INTRAVENOUS | Status: DC | PRN
  Administered 2015-02-09 (×2): 10 mg via INTRAVENOUS
  Administered 2015-02-09: 20 mg via INTRAVENOUS
  Administered 2015-02-09 (×2): 10 mg via INTRAVENOUS
  Administered 2015-02-09: 40 mg via INTRAVENOUS

## 2015-02-09 MED ORDER — ONDANSETRON HCL 4 MG/2ML IJ SOLN
INTRAMUSCULAR | Status: DC | PRN
  Administered 2015-02-09 (×2): 4 mg via INTRAVENOUS

## 2015-02-09 MED ORDER — MIDAZOLAM HCL 2 MG/2ML IJ SOLN
INTRAMUSCULAR | Status: AC
  Filled 2015-02-09: qty 2

## 2015-02-09 MED ORDER — DEXAMETHASONE SODIUM PHOSPHATE 4 MG/ML IJ SOLN
INTRAMUSCULAR | Status: AC
  Filled 2015-02-09: qty 2

## 2015-02-09 MED ORDER — PHENYLEPHRINE HCL 10 MG/ML IJ SOLN
10.0000 mg | INTRAVENOUS | Status: DC | PRN
  Administered 2015-02-09: 15 ug/min via INTRAVENOUS

## 2015-02-09 MED ORDER — LIDOCAINE HCL (CARDIAC) 20 MG/ML IV SOLN
INTRAVENOUS | Status: DC | PRN
  Administered 2015-02-09: 60 mg via INTRAVENOUS

## 2015-02-09 MED ORDER — ROCURONIUM BROMIDE 50 MG/5ML IV SOLN
INTRAVENOUS | Status: AC
  Filled 2015-02-09: qty 1

## 2015-02-09 MED ORDER — PROMETHAZINE HCL 25 MG/ML IJ SOLN: 6.2500 mg | INTRAMUSCULAR | Status: DC | PRN

## 2015-02-09 MED ORDER — KCL IN DEXTROSE-NACL 20-5-0.45 MEQ/L-%-% IV SOLN
INTRAVENOUS | Status: DC
  Administered 2015-02-09 – 2015-02-10 (×2): via INTRAVENOUS
  Filled 2015-02-09 (×5): qty 1000

## 2015-02-09 MED ORDER — LIDOCAINE-EPINEPHRINE 1 %-1:100000 IJ SOLN
INTRAMUSCULAR | Status: AC
  Filled 2015-02-09: qty 1

## 2015-02-09 MED ORDER — BACITRACIN ZINC 500 UNIT/GM EX OINT
TOPICAL_OINTMENT | CUTANEOUS | Status: AC
  Filled 2015-02-09: qty 28.35

## 2015-02-09 MED ORDER — SODIUM CHLORIDE 0.9 % IJ SOLN
INTRAMUSCULAR | Status: AC
  Filled 2015-02-09: qty 10

## 2015-02-09 MED ORDER — OXYMETAZOLINE HCL 0.05 % NA SOLN
NASAL | Status: DC | PRN
  Administered 2015-02-09: 1

## 2015-02-09 MED ORDER — NEOSTIGMINE METHYLSULFATE 10 MG/10ML IV SOLN
INTRAVENOUS | Status: DC | PRN
  Administered 2015-02-09: 5 mg via INTRAVENOUS

## 2015-02-09 MED ORDER — SERTRALINE HCL 100 MG PO TABS
150.0000 mg | ORAL_TABLET | Freq: Every day | ORAL | Status: DC
  Administered 2015-02-09 – 2015-02-10 (×2): 150 mg via ORAL
  Filled 2015-02-09 (×2): qty 1

## 2015-02-09 MED ORDER — DEXAMETHASONE SODIUM PHOSPHATE 10 MG/ML IJ SOLN
INTRAMUSCULAR | Status: DC | PRN
  Administered 2015-02-09: 10 mg via INTRAVENOUS

## 2015-02-09 MED ORDER — HYDROMORPHONE HCL 1 MG/ML IJ SOLN
0.2500 mg | INTRAMUSCULAR | Status: DC | PRN
  Administered 2015-02-09 (×4): 0.5 mg via INTRAVENOUS

## 2015-02-09 MED ORDER — ONDANSETRON HCL 4 MG/2ML IJ SOLN
4.0000 mg | INTRAMUSCULAR | Status: DC | PRN
  Administered 2015-02-09: 4 mg via INTRAVENOUS
  Filled 2015-02-09: qty 2

## 2015-02-09 MED ORDER — ONDANSETRON HCL 4 MG PO TABS: 4.0000 mg | ORAL_TABLET | ORAL | Status: DC | PRN

## 2015-02-09 MED ORDER — PREGABALIN 100 MG PO CAPS
300.0000 mg | ORAL_CAPSULE | Freq: Every day | ORAL | Status: DC
  Administered 2015-02-09: 300 mg via ORAL
  Filled 2015-02-09: qty 3

## 2015-02-09 MED ORDER — TRAZODONE HCL 50 MG PO TABS
50.0000 mg | ORAL_TABLET | Freq: Every day | ORAL | Status: DC
  Administered 2015-02-09: 50 mg via ORAL
  Filled 2015-02-09 (×2): qty 1

## 2015-02-09 MED ORDER — 0.9 % SODIUM CHLORIDE (POUR BTL) OPTIME
TOPICAL | Status: DC | PRN
  Administered 2015-02-09: 1000 mL

## 2015-02-09 MED ORDER — EPHEDRINE SULFATE 50 MG/ML IJ SOLN
INTRAMUSCULAR | Status: AC
  Filled 2015-02-09: qty 1

## 2015-02-09 MED ORDER — MEPERIDINE HCL 25 MG/ML IJ SOLN: 6.2500 mg | INTRAMUSCULAR | Status: DC | PRN

## 2015-02-09 MED ORDER — MIDAZOLAM HCL 5 MG/5ML IJ SOLN
INTRAMUSCULAR | Status: DC | PRN
  Administered 2015-02-09: 2 mg via INTRAVENOUS

## 2015-02-09 MED ORDER — LIDOCAINE HCL (CARDIAC) 20 MG/ML IV SOLN
INTRAVENOUS | Status: AC
  Filled 2015-02-09: qty 5

## 2015-02-09 MED ORDER — PHENYLEPHRINE 40 MCG/ML (10ML) SYRINGE FOR IV PUSH (FOR BLOOD PRESSURE SUPPORT)
PREFILLED_SYRINGE | INTRAVENOUS | Status: AC
  Filled 2015-02-09: qty 10

## 2015-02-09 MED ORDER — PROPOFOL 10 MG/ML IV BOLUS
INTRAVENOUS | Status: AC
  Filled 2015-02-09: qty 20

## 2015-02-09 MED ORDER — GLYCOPYRROLATE 0.2 MG/ML IJ SOLN
INTRAMUSCULAR | Status: DC | PRN
  Administered 2015-02-09: 0.2 mg via INTRAVENOUS
  Administered 2015-02-09: .8 mg via INTRAVENOUS

## 2015-02-09 MED ORDER — OXYMETAZOLINE HCL 0.05 % NA SOLN
NASAL | Status: AC
  Filled 2015-02-09: qty 15

## 2015-02-09 MED ORDER — IBUPROFEN 100 MG/5ML PO SUSP
400.0000 mg | Freq: Four times a day (QID) | ORAL | Status: DC | PRN
  Filled 2015-02-09: qty 20

## 2015-02-09 MED ORDER — FENTANYL CITRATE (PF) 250 MCG/5ML IJ SOLN
INTRAMUSCULAR | Status: AC
  Filled 2015-02-09: qty 5

## 2015-02-09 MED ORDER — HYDROMORPHONE HCL 1 MG/ML IJ SOLN
INTRAMUSCULAR | Status: AC
  Administered 2015-02-09: 0.5 mg via INTRAVENOUS
  Filled 2015-02-09: qty 1

## 2015-02-09 MED ORDER — LACTATED RINGERS IV SOLN: INTRAVENOUS | Status: DC

## 2015-02-09 MED ORDER — CLONAZEPAM 0.5 MG PO TABS
0.5000 mg | ORAL_TABLET | Freq: Two times a day (BID) | ORAL | Status: DC
  Administered 2015-02-09 – 2015-02-10 (×3): 0.5 mg via ORAL
  Filled 2015-02-09 (×3): qty 1

## 2015-02-09 MED ORDER — TEMAZEPAM 7.5 MG PO CAPS
15.0000 mg | ORAL_CAPSULE | Freq: Every evening | ORAL | Status: DC | PRN
Start: 2015-02-09 — End: 2015-02-10

## 2015-02-09 SURGICAL SUPPLY — 62 items
BLADE 10 SAFETY STRL DISP (BLADE) ×4 IMPLANT
BLADE INF TURB ROT M4 2 5PK (BLADE) ×3 IMPLANT
BLADE INF TURB ROT M4 2MM 5PK (BLADE) ×1
BLADE SURG 15 STRL LF DISP TIS (BLADE) IMPLANT
BLADE SURG 15 STRL SS (BLADE) ×4
CANISTER SUCTION 2500CC (MISCELLANEOUS) ×4 IMPLANT
CLEANER TIP ELECTROSURG 2X2 (MISCELLANEOUS) ×4 IMPLANT
CLOSURE WOUND 1/2 X4 (GAUZE/BANDAGES/DRESSINGS) ×1
COAGULATOR SUCT 8FR VV (MISCELLANEOUS) ×2 IMPLANT
COVER MAYO STAND STRL (DRAPES) ×2 IMPLANT
DRAPE PROXIMA HALF (DRAPES) ×2 IMPLANT
DRESSING NASAL KENNEDY 3.5X.9 (MISCELLANEOUS) ×2 IMPLANT
DRESSING TELFA 8X3 (GAUZE/BANDAGES/DRESSINGS) ×4 IMPLANT
DRSG NASAL KENNEDY 3.5X.9 (MISCELLANEOUS) ×4
DRSG TELFA 3X8 NADH (GAUZE/BANDAGES/DRESSINGS) ×4 IMPLANT
ELECT COATED BLADE 2.86 ST (ELECTRODE) ×4 IMPLANT
ELECT REM PT RETURN 9FT ADLT (ELECTROSURGICAL) ×4
ELECTRODE REM PT RTRN 9FT ADLT (ELECTROSURGICAL) ×2 IMPLANT
GAUZE SPONGE 2X2 8PLY STRL LF (GAUZE/BANDAGES/DRESSINGS) IMPLANT
GLOVE BIOGEL PI IND STRL 7.0 (GLOVE) IMPLANT
GLOVE BIOGEL PI INDICATOR 7.0 (GLOVE) ×2
GLOVE SS BIOGEL STRL SZ 7.5 (GLOVE) ×2 IMPLANT
GLOVE SUPERSENSE BIOGEL SZ 7.5 (GLOVE) ×2
GLOVE SURG SS PI 7.0 STRL IVOR (GLOVE) ×2 IMPLANT
GOWN STRL REUS W/ TWL LRG LVL3 (GOWN DISPOSABLE) ×2 IMPLANT
GOWN STRL REUS W/ TWL XL LVL3 (GOWN DISPOSABLE) ×2 IMPLANT
GOWN STRL REUS W/TWL LRG LVL3 (GOWN DISPOSABLE) ×4
GOWN STRL REUS W/TWL XL LVL3 (GOWN DISPOSABLE) ×4
KIT BASIN OR (CUSTOM PROCEDURE TRAY) ×4 IMPLANT
KIT ROOM TURNOVER OR (KITS) ×4 IMPLANT
NDL HYPO 25GX1X1/2 BEV (NEEDLE) IMPLANT
NDL HYPO 25X1 1.5 SAFETY (NEEDLE) ×2 IMPLANT
NEEDLE 27GAX1X1/2 (NEEDLE) ×4 IMPLANT
NEEDLE HYPO 25GX1X1/2 BEV (NEEDLE) ×4 IMPLANT
NEEDLE HYPO 25X1 1.5 SAFETY (NEEDLE) ×4 IMPLANT
NS IRRIG 1000ML POUR BTL (IV SOLUTION) ×4 IMPLANT
PAD ARMBOARD 7.5X6 YLW CONV (MISCELLANEOUS) ×8 IMPLANT
PAD DRESSING TELFA 3X8 NADH (GAUZE/BANDAGES/DRESSINGS) IMPLANT
PATTIES SURGICAL .5 X3 (DISPOSABLE) ×4 IMPLANT
PENCIL FOOT CONTROL (ELECTRODE) ×4 IMPLANT
SPECIMEN JAR SMALL (MISCELLANEOUS) ×4 IMPLANT
SPLINT NASAL DOYLE BI-VL (GAUZE/BANDAGES/DRESSINGS) IMPLANT
SPONGE GAUZE 2X2 STER 10/PKG (GAUZE/BANDAGES/DRESSINGS) ×2
SPONGE INTESTINAL PEANUT (DISPOSABLE) ×4 IMPLANT
SPONGE TONSIL 1 RF SGL (DISPOSABLE) ×2 IMPLANT
STRIP CLOSURE SKIN 1/2X4 (GAUZE/BANDAGES/DRESSINGS) ×1 IMPLANT
SUT CHROMIC 3 0 PS 2 (SUTURE) ×4 IMPLANT
SUT CHROMIC 4 0 PS 2 18 (SUTURE) ×4 IMPLANT
SUT CHROMIC 4 0 PS 5 (SUTURE) ×4 IMPLANT
SUT CHROMIC 5 0 P 3 (SUTURE) IMPLANT
SUT ETHILON 3 0 PS 1 (SUTURE) IMPLANT
SUT ETHILON 4 0 PS 2 18 (SUTURE) IMPLANT
SUT ETHILON 5 0 P 3 18 (SUTURE)
SUT NYLON ETHILON 5-0 P-3 1X18 (SUTURE) IMPLANT
SUT SILK 2 0 FS (SUTURE) ×4 IMPLANT
SUT VIC AB 3-0 SH 27 (SUTURE) ×12
SUT VIC AB 3-0 SH 27XBRD (SUTURE) ×6 IMPLANT
SUT VIC AB 4-0 PS2 27 (SUTURE) ×2 IMPLANT
SYR 3ML 25GX5/8 SAFETY (SYRINGE) ×2 IMPLANT
TOWEL OR 17X24 6PK STRL BLUE (TOWEL DISPOSABLE) ×4 IMPLANT
TRAY ENT MC OR (CUSTOM PROCEDURE TRAY) ×4 IMPLANT
WATER STERILE IRR 1000ML POUR (IV SOLUTION) ×4 IMPLANT

## 2015-02-09 NOTE — Progress Notes (Signed)
Patient unable to void spontaneously. C/o of mild discomfort in lower abdomen. Bladder scan showed 587 cc. Patient voided 75 cc on his own. Patient was intermittently catherized and 300 cc was removed from bladder. Will cont to monitor.

## 2015-02-09 NOTE — Anesthesia Postprocedure Evaluation (Signed)
Anesthesia Post Note  Patient: [facility]  Procedure(s) Performed: Procedure(s) (LRB): SEPTOPLASTY (N/A) BILATERAL TURBINATE REDUCTION (Bilateral) UVULOPALATOPHARYNGOPLASTY (UPPP) (N/A)  Patient location during evaluation: PACU Anesthesia Type: General Level of consciousness: awake and alert Pain management: pain level controlled Vital Signs Assessment: post-procedure vital signs reviewed and stable Respiratory status: spontaneous breathing, nonlabored ventilation, respiratory function stable and patient connected to nasal cannula oxygen Cardiovascular status: blood pressure returned to baseline and stable Postop Assessment: No signs of nausea or vomiting Anesthetic complications: no    Last Vitals:  Filed Vitals:   02/09/15 1255 02/09/15 1310  BP:  150/83  Pulse:  93  Temp: 36.7 C 36.6 C  Resp:      Last Pain:  Filed Vitals:   02/09/15 1346  PainSc: 4     LLE Motor Response: Purposeful movement, Responds to commands   RLE Motor Response: Purposeful movement, Responds to commands        Effie Berkshire

## 2015-02-09 NOTE — Anesthesia Preprocedure Evaluation (Addendum)
Anesthesia Evaluation  Patient identified by MRN, date of birth, ID band Patient awake    Reviewed: Allergy & Precautions, NPO status , Patient's Chart, lab work & pertinent test results  Airway Mallampati: III  TM Distance: >3 FB Neck ROM: Full    Dental  (+) Teeth Intact   Pulmonary sleep apnea , former smoker,    breath sounds clear to auscultation       Cardiovascular hypertension, Pt. on medications  Rhythm:Regular Rate:Normal     Neuro/Psych  Headaches, PSYCHIATRIC DISORDERS Anxiety Depression  Neuromuscular disease    GI/Hepatic Neg liver ROS, GERD  ,  Endo/Other  negative endocrine ROS  Renal/GU negative Renal ROS  negative genitourinary   Musculoskeletal  (+) Arthritis , Fibromyalgia -  Abdominal   Peds negative pediatric ROS (+)  Hematology   Anesthesia Other Findings   Reproductive/Obstetrics negative OB ROS                            Lab Results  Component Value Date   WBC 5.7 02/03/2015   HGB 15.2 02/03/2015   HCT 45.1 02/03/2015   MCV 93.0 02/03/2015   PLT 152 02/03/2015   Lab Results  Component Value Date   CREATININE 1.07 02/03/2015   BUN 14 02/03/2015   NA 140 02/03/2015   K 4.4 02/03/2015   CL 107 02/03/2015   CO2 25 02/03/2015   No results found for: INR, PROTIME  EKG: normal sinus rhythm.  Anesthesia Physical Anesthesia Plan  ASA: II  Anesthesia Plan: General   Post-op Pain Management:    Induction: Intravenous  Airway Management Planned: Oral ETT  Additional Equipment:   Intra-op Plan:   Post-operative Plan: Extubation in OR  Informed Consent: I have reviewed the patients History and Physical, chart, labs and discussed the procedure including the risks, benefits and alternatives for the proposed anesthesia with the patient or authorized representative who has indicated his/her understanding and acceptance.   Dental advisory given  Plan  Discussed with: CRNA  Anesthesia Plan Comments:         Anesthesia Quick Evaluation

## 2015-02-09 NOTE — Transfer of Care (Signed)
Immediate Anesthesia Transfer of Care Note  Patient: Patrick [name] [facility]  Procedure(s) Performed: Procedure(s): SEPTOPLASTY (N/A) BILATERAL TURBINATE REDUCTION (Bilateral) UVULOPALATOPHARYNGOPLASTY (UPPP) (N/A)  Patient Location: PACU  Anesthesia Type:General  Level of Consciousness: awake, alert , oriented and patient cooperative  Airway & Oxygen Therapy: Patient Spontanous Breathing and Patient connected to face mask oxygen  Post-op Assessment: Report given to RN and Post -op Vital signs reviewed and stable  Post vital signs: Reviewed and stable  Last Vitals:  Filed Vitals:   02/09/15 0702 02/09/15 1024  BP: 117/79 128/72  Pulse: 70 91  Temp: 36.2 C 36.5 C  Resp: 18 17    Complications: No apparent anesthesia complications

## 2015-02-09 NOTE — Op Note (Signed)
Patrick [name], Patrick [name]         ACCOUNT NO.:  [id number]  MEDICAL RECORD NO.:  [id number]  LOCATION:  3S13C                        FACILITY:  [facility]  PHYSICIAN:  Leonides Sake. Lucia Gaskins, M.D.DATE OF BIRTH:  12-Jun-1950  DATE OF PROCEDURE: DATE OF DISCHARGE:                              OPERATIVE REPORT   PREOPERATIVE DIAGNOSIS:  Obstructive sleep apnea with nasal obstruction, septal deviation and turbinate hypertrophy.  POSTOPERATIVE DIAGNOSIS:  Obstructive sleep apnea with nasal obstruction, septal deviation and turbinate hypertrophy.  OPERATION PERFORMED:  Septoplasty with bilateral inferior turbinate reductions.  Uvulopalatopharyngoplasty with tonsillectomy.  SURGEON:  Leonides Sake. Lucia Gaskins, M.D.  ANESTHESIA:  General endotracheal.  COMPLICATIONS:  None.  BRIEF CLINICAL NOTE:  Patrick [name] is a 64 year old gentleman who has had a long history of obstructive sleep apnea.  He complains of excessive daytime somnolence as well as some difficulty sleeping at night.  His wife states that he snores heavily at night.  He has had a couple of sleep test that demonstrated moderate sleep apnea with sleep apnea much worse in the supine position.  His AHI was 16 and his O2 saturation went down to 80%.  He has tried nasal CPAP, really does not tolerate this and really does not use the CPAP.  He has a fair amount of nasal obstruction and was referred for evaluation of his nasal obstruction and treatment of sleep apnea from a surgical point of view. On exam in the office, he has a moderate septal deflection and large turbinates.  There are no polyps or other intranasal masses noted.  Oral exam reveals a long uvula and soft palate, but small tonsils.  He was taken to the operating room at this time for septoplasty, turbinate reductions and UPPP with a tonsillectomy to improve his sleep apnea and nasal breathing.  DESCRIPTION OF PROCEDURE:  The patient was brought to the operating  room and placed in the supine position, underwent general endotracheal anesthesia.  He received 2 g of Ancef IV preoperatively as well as a 10 mg of Decadron.  First, the nose was prepped with Betadine solution and draped with sterile towels.  On examination of nose, the patient had large turbinates, a moderate septal deflection with a spur to the right and more anteriorly, the septum was superiorly bowed more to the left, but along the floor of the nose on the right.  He has some cartilaginous septum bowing off the maxillary crest into the right airway.  A hemitransfixion incision was made along the cartilaginous septum on the right side.  Mucoperichondrial and mucoperiosteal flaps were elevated posteriorly.  The portion of the cartilaginous septum that bowed into the right airway along the floor of the nose and off the maxillary crest was excised and removed.  Just anterior to the cartilaginous bony junction, a vertical incision was made through the cartilaginous septum and a portion of the cartilaginous septum and the bony septum spur that bowed to the right side was also removed after elevating mucoperiosteal flaps on either side.  This completed the septoplasty portion of the procedure.  Next, using the [employer] turbinate blade, submucosal turbinate reductions was performed.  Then, the turbinates were outfractured and cautery was used to cauterize any bleeding site as  well as the posterior portion of the inferior turbinate.  This completed the turbinate reductions.  A hemitransfixion incision was closed with 4-0 chromic suture.  The septum was basted with a 3-0 chromic suture.  Telfa packs soaked in bacitracin ointment were placed along the floor of the nose on either side of septum.  Next, a mouth gag was used to expose the oropharynx.  The patient had small tonsils, fair amount of redundant palatal mucosa.  Tonsils were removed from the tonsillar fossas in addition to little bit  of the anterior tonsil pillar.  After removing both tonsils, the uvula was transected pretty much at its base. Hemostasis was obtained with cautery.  Oropharynx was irrigated with saline.  Then, the mucosal margins of the palate were reapproximated with 4-0 Vicryl sutures at the corner and 3-0 chromic sutures running to reapproximate the palatal mucosa on either side of the soft palate, turbinates and uvula.  This completed the procedure.  The patient was awakened from anesthesia and transferred to the recovery room postoperatively doing well.  DISPOSITION:  Patrick [name] will be observed overnight in the Step-Down Unit for 24 hours.  Plan on removing his nasal packing and discharging him home in the morning.  We will plan on discharging him home on amoxicillin suspension 500 mg b.i.d. for 10 days and Tylenol and hydrocodone elixir p.r.n. pain.  I will have him follow up in my office in 10 days to 2 weeks for recheck.          ______________________________ Leonides Sake. Lucia Gaskins, M.D.     CEN/MEDQ  D:  02/09/2015  T:  02/09/2015  Job:  [id number]  cc:   Star Age, M.D.

## 2015-02-09 NOTE — Brief Op Note (Signed)
02/09/2015  10:13 AM  PATIENT:  Patrick [name]  64 y.o. male  PRE-OPERATIVE DIAGNOSIS:  SLEEP APNEA DEVATED SEPTUM  POST-OPERATIVE DIAGNOSIS:  SLEEP APNEA DEVATED SEPTUM  PROCEDURE:  Procedure(s): SEPTOPLASTY (N/A) BILATERAL TURBINATE REDUCTION (Bilateral) UVULOPALATOPHARYNGOPLASTY (UPPP) (N/A)  SURGEON:  Surgeon(s) and Role:    * Rozetta Nunnery, MD - Primary  PHYSICIAN ASSISTANT:   ASSISTANTS: none   ANESTHESIA:   general  EBL:  Total I/O In: 1000 [I.V.:1000] Out: 30 [Blood:30]  BLOOD ADMINISTERED:none  DRAINS: none   LOCAL MEDICATIONS USED:  LIDOCAINE with EPI  15 cc  SPECIMEN:  No Specimen  DISPOSITION OF SPECIMEN:  N/A  COUNTS:  YES  TOURNIQUET:  * No tourniquets in log *  DICTATION: .Other Dictation: Dictation Number [id number]  PLAN OF CARE: Admit for overnight observation  PATIENT DISPOSITION:  PACU - hemodynamically stable.   Delay start of Pharmacological VTE agent (>24hrs) due to surgical blood loss or risk of bleeding: yes

## 2015-02-09 NOTE — Progress Notes (Signed)
Post Op check AF VSS Required I&O cath Minimal bleeding no airway problems Stable post op  Will plan on removing nasal packing in am and discharge home.

## 2015-02-09 NOTE — Interval H&P Note (Signed)
History and Physical Interval Note:  02/09/2015 7:32 AM  Patrick [name]  has presented today for surgery, with the diagnosis of [place]  The various methods of treatment have been discussed with the patient and family. After consideration of risks, benefits and other options for treatment, the patient has consented to  Procedure(s): SEPTOPLASTY (N/A) BILATERAL TURBINATE REDUCTION (Bilateral) UVULOPALATOPHARYNGOPLASTY (UPPP) (N/A) as a surgical intervention .  The patient's history has been reviewed, patient examined, no change in status, stable for surgery.  I have reviewed the patient's chart and labs.  Questions were answered to the patient's satisfaction.     [name]

## 2015-02-10 ENCOUNTER — Encounter (HOSPITAL_COMMUNITY): Payer: Self-pay | Admitting: Otolaryngology

## 2015-02-10 DIAGNOSIS — G4733 Obstructive sleep apnea (adult) (pediatric): Secondary | ICD-10-CM | POA: Diagnosis not present

## 2015-02-10 MED ORDER — HYDROCODONE-ACETAMINOPHEN 5-325 MG PO TABS: 1.0000 | ORAL_TABLET | Freq: Four times a day (QID) | ORAL | Status: DC | PRN

## 2015-02-10 MED ORDER — HYDROCODONE-ACETAMINOPHEN 7.5-325 MG/15ML PO SOLN: 10.0000 mL | Freq: Four times a day (QID) | ORAL | Status: DC | PRN

## 2015-02-10 MED ORDER — HYDROCODONE-ACETAMINOPHEN 7.5-325 MG/15ML PO SOLN: 10.0000 mL | ORAL | Status: DC | PRN

## 2015-02-10 MED ORDER — AMOXICILLIN 250 MG/5ML PO SUSR: 500.0000 mg | Freq: Two times a day (BID) | ORAL | Status: AC

## 2015-02-10 NOTE — Progress Notes (Signed)
Patient discharged to home at ~ 1215 with spouse via wheelchair. Patient and spouse verbalized understanding of discharge instructions.

## 2015-02-10 NOTE — Progress Notes (Signed)
POD 1 Discharge AF VSS Nasal packing removed with minimal bleeding No tonsil bleeding noted OK pos Stable post op course  Discharge home        [id number]

## 2015-02-10 NOTE — Discharge Instructions (Signed)
Instructions for Home Care After Tonsillectomy  First Day Home: Encourage fluid intake by frequently offering liquids, soup, ice cream jello, etc.  Drink several glasses of water.  Cooler fluids are best.  Avoid hot and highly seasoned foods.  Orange juice, grapefruit juice and tomato juice may cause stinging sensation because of their acidic content.    Second and Third Day Home: Continue liquids and add soft foods, (pudding, macaroni and cheese, mashed potatoes, soft scrambled eggs, etc.).  Make sure you drink plenty of liquids so you do not get dehydrated.  Fifth Thru Seventh Day Home: Gradually resume a normal diet, but avoid hot foods, potato chips, nuts, toast and crackers until 2 weeks after surgery.  General Instructions   No undue physical exertion or exercise for one week.  Children: Tylenol may be used for discomfort and/or fever.  Use as often as necessary within limits of the directions.  Adults: May spray throat with Chloroseptic or other topical anesthetic for discomfort and use pain medication obtained by prescription as directed.    A slight fever (up to 101) is expected for the first the first couple of days.  Take Tylenol (or aspirin substitute) as directed.  Pain in ears is common after tonsillectomy.  It represents pain referred from the throat where the tonsils were removed.  There is usually nothing wrong with the ears in most cases.  Administer Tylenol as needed to control this pain.  White patches will form where the tonsils were removed.  This is perfectly normal.  They will disappear in one to two weeks.  Mouth odor may be notice during the healing stages.  Do not use aspirin for two weeks; it increases the possibility of bleeding.  In a very small percentage of people, there is some bleeding after five to six days.  If this happens, do not become excited, for the bleeding is usually light.  Be quiet, lie down, and spit the blood out gently.  Gargle the throat  with ice water.  If the bleeding does not stop promptly, call the office [phone]), which answers 24 hours a day.  A follow up appointment should be made with Dr. Lucia Gaskins 10-14 days following surgery. Please call [phone] for the appointment time.  Take your regular medications Take amoxicillin 500 mg twice per day for the next week Tylenol or hydrocodone liquid or tablet prn pain  Rinse nose daily with saline spray

## 2015-02-11 NOTE — Discharge Summary (Signed)
[name], Patrick         ACCOUNT NO.:  [id number]  MEDICAL RECORD NO.:  [id number]  LOCATION:  3S13C                        FACILITY:  [place]  PHYSICIAN:  Leonides Sake. Lucia Gaskins, M.D.DATE OF BIRTH:  January 16, 1951  DATE OF ADMISSION:  02/09/2015 DATE OF DISCHARGE:  02/10/2015                              DISCHARGE SUMMARY   DIAGNOSES: 1. Obstructive sleep apnea. 2. Nasal obstruction with a deviated septum and turbinate hypertrophy. 3. Benign prostatic hyperplasia with urinary obstruction.  OPERATIONS DURING THIS HOSPITALIZATION:  Septoplasty with bilateral inferior turbinate reductions and uvulopalatopharyngoplasty with tonsillectomy performed on February 09, 2015.  HOSPITAL COURSE:  The patient was admitted via the operating room on February 09, 2015, at which time, he underwent a septoplasty, turbinate reduction, and uvulopalatopharyngoplasty and tonsillectomy to improve obstructive sleep apnea and nasal obstruction.  The patient tolerated the surgery well, was postoperatively admitted to step-down unit.  He received perioperative antibiotic, Ancef 1 g IV q.8 h.  Also received preoperative Decadron 10 mg.  Postop course was unremarkable.  The patient had no airway problems.  Nasal packing was removed on his first postoperative day with minimal bleeding.  He is tolerating p.o. diet adequately.  He has had some urinary retention requiring in and out catheterization.  He remained afebrile.  Vital signs were stable. Following removing nasal packing on first postoperative day, the patient was subsequently discharged home.  Again, he had no bleeding problems, minimal trouble swallowing with pain.  He had no airway problems.  DISPOSITION:  The patient is discharged home on his regular  medications in addition to amoxicillin suspension 500 mg b.i.d. for 1 week, Tylenol, Vicodin tablets, or suspension for pain.  We will have him follow up in office in 10 days for recheck.  FINAL  DIAGNOSES:  Obstructive sleep apnea with nasal obstruction and septal deformity and turbinate hypertrophy.          ______________________________ Leonides Sake. Lucia Gaskins, M.D.     CEN/MEDQ  D:  02/10/2015  T:  02/11/2015  Job:  [id number]

## 2015-04-30 DIAGNOSIS — F3341 Major depressive disorder, recurrent, in partial remission: Secondary | ICD-10-CM | POA: Insufficient documentation

## 2015-05-25 DIAGNOSIS — G8929 Other chronic pain: Secondary | ICD-10-CM | POA: Insufficient documentation

## 2015-07-25 LAB — LIPID PANEL
CHOLESTEROL: 154 mg/dL (ref 0–200)
HDL: 51 mg/dL (ref 35–70)
LDL Cholesterol: 98 mg/dL
TRIGLYCERIDES: 82 mg/dL (ref 40–160)

## 2015-07-25 LAB — HEPATIC FUNCTION PANEL
ALK PHOS: 68 U/L (ref 25–125)
ALT: 19 U/L (ref 10–40)
AST: 19 U/L (ref 14–40)
Bilirubin, Total: 0.6 mg/dL

## 2015-07-25 LAB — BASIC METABOLIC PANEL
BUN: 13 mg/dL (ref 4–21)
CREATININE: 1 mg/dL (ref 0.6–1.3)
Glucose: 88 mg/dL
POTASSIUM: 4.2 mmol/L (ref 3.4–5.3)
Sodium: 143 mmol/L (ref 137–147)

## 2015-07-25 LAB — CBC AND DIFFERENTIAL
HCT: 45 % (ref 41–53)
HEMOGLOBIN: 15.3 g/dL (ref 13.5–17.5)
PLATELETS: 154 10*3/uL (ref 150–399)
WBC: 3.6 10^3/mL

## 2015-07-27 LAB — TSH: TSH: 1.56 u[IU]/mL (ref <=5.90)

## 2015-09-26 ENCOUNTER — Encounter (HOSPITAL_BASED_OUTPATIENT_CLINIC_OR_DEPARTMENT_OTHER): Payer: Self-pay | Admitting: Emergency Medicine

## 2015-09-26 ENCOUNTER — Emergency Department (HOSPITAL_BASED_OUTPATIENT_CLINIC_OR_DEPARTMENT_OTHER)
Admission: EM | Admit: 2015-09-26 | Discharge: 2015-09-26 | Disposition: A | Discharge status: Home / Self Care | Payer: BLUE CROSS/BLUE SHIELD | Attending: Emergency Medicine | Admitting: Emergency Medicine

## 2015-09-26 ENCOUNTER — Emergency Department (HOSPITAL_BASED_OUTPATIENT_CLINIC_OR_DEPARTMENT_OTHER): Payer: BLUE CROSS/BLUE SHIELD

## 2015-09-26 DIAGNOSIS — F329 Major depressive disorder, single episode, unspecified: Secondary | ICD-10-CM | POA: Diagnosis not present

## 2015-09-26 DIAGNOSIS — R0789 Other chest pain: Secondary | ICD-10-CM

## 2015-09-26 DIAGNOSIS — R072 Precordial pain: Secondary | ICD-10-CM | POA: Diagnosis present

## 2015-09-26 DIAGNOSIS — Z87891 Personal history of nicotine dependence: Secondary | ICD-10-CM | POA: Insufficient documentation

## 2015-09-26 DIAGNOSIS — I10 Essential (primary) hypertension: Secondary | ICD-10-CM | POA: Insufficient documentation

## 2015-09-26 DIAGNOSIS — K219 Gastro-esophageal reflux disease without esophagitis: Secondary | ICD-10-CM | POA: Diagnosis not present

## 2015-09-26 DIAGNOSIS — Z79899 Other long term (current) drug therapy: Secondary | ICD-10-CM | POA: Diagnosis not present

## 2015-09-26 LAB — CBC WITH DIFFERENTIAL/PLATELET
BASOS ABS: 0 10*3/uL (ref 0.0–0.1)
Basophils Relative: 1 %
EOS ABS: 0.1 10*3/uL (ref 0.0–0.7)
EOS PCT: 1 %
HCT: 42.1 % (ref 39.0–52.0)
HEMOGLOBIN: 14.6 g/dL (ref 13.0–17.0)
LYMPHS ABS: 1.3 10*3/uL (ref 0.7–4.0)
Lymphocytes Relative: 26 %
MCH: 32.3 pg (ref 26.0–34.0)
MCHC: 34.7 g/dL (ref 30.0–36.0)
MCV: 93.1 fL (ref 78.0–100.0)
MONO ABS: 0.5 10*3/uL (ref 0.1–1.0)
Monocytes Relative: 10 %
Neutro Abs: 3 10*3/uL (ref 1.7–7.7)
Neutrophils Relative %: 62 %
Platelets: 157 10*3/uL (ref 150–400)
RBC: 4.52 MIL/uL (ref 4.22–5.81)
RDW: 13.7 % (ref 11.5–15.5)
WBC: 4.9 10*3/uL (ref 4.0–10.5)

## 2015-09-26 LAB — TROPONIN I: Troponin I: <0.03 ng/mL (ref <0.03)

## 2015-09-26 LAB — HEPATIC FUNCTION PANEL
ALK PHOS: 62 U/L (ref 38–126)
ALT: 31 U/L (ref 17–63)
AST: 27 U/L (ref 15–41)
Albumin: 4.4 g/dL (ref 3.5–5.0)
Bilirubin, Direct: 0.1 mg/dL (ref 0.1–0.5)
Indirect Bilirubin: 0.4 mg/dL (ref 0.3–0.9)
Total Bilirubin: 0.5 mg/dL (ref 0.3–1.2)
Total Protein: 6.8 g/dL (ref 6.5–8.1)

## 2015-09-26 LAB — BASIC METABOLIC PANEL
Anion gap: 7 (ref 5–15)
BUN: 19 mg/dL (ref 6–20)
CHLORIDE: 106 mmol/L (ref 101–111)
CO2: 28 mmol/L (ref 22–32)
CREATININE: 0.84 mg/dL (ref 0.61–1.24)
Calcium: 9.3 mg/dL (ref 8.9–10.3)
GFR calc Af Amer: >60 mL/min (ref >60)
GFR calc non Af Amer: >60 mL/min (ref >60)
GLUCOSE: 94 mg/dL (ref 65–99)
POTASSIUM: 3.4 mmol/L — AB (ref 3.5–5.1)
Sodium: 141 mmol/L (ref 135–145)

## 2015-09-26 LAB — LIPASE, BLOOD: LIPASE: 32 U/L (ref 11–51)

## 2015-09-26 MED ORDER — PANTOPRAZOLE SODIUM 20 MG PO TBEC: 20.0000 mg | DELAYED_RELEASE_TABLET | Freq: Every day | ORAL | Status: DC

## 2015-09-26 MED ORDER — GI COCKTAIL ~~LOC~~
30.0000 mL | Freq: Once | ORAL | Status: AC
  Administered 2015-09-26: 30 mL via ORAL
  Filled 2015-09-26: qty 30

## 2015-09-26 MED ORDER — SUCRALFATE 1 GM/10ML PO SUSP: 1.0000 g | Freq: Three times a day (TID) | ORAL | Status: DC

## 2015-09-26 NOTE — ED Notes (Signed)
Pt on automatic VS and monitor.

## 2015-09-26 NOTE — ED Provider Notes (Signed)
CSN: [id number]     Arrival date & time 09/26/15  1434 History   First MD Initiated Contact with Patient 09/26/15 1445     Chief Complaint  Patient presents with  . Chest Pain   Patient is a 65 y.o. male presenting with chest pain.  Chest Pain Pain location:  Substernal area and epigastric Pain radiates to:  Does not radiate Pain radiates to the back: no   Timing:  Constant Progression:  Unchanged Chronicity:  Chronic Relieved by:  Antacids Ineffective treatments: Zantac. Associated symptoms: heartburn   Associated symptoms: no back pain, no cough, no diaphoresis, no dizziness, no fever, no nausea, no near-syncope, no numbness, no palpitations, no shortness of breath, no syncope, not vomiting and no weakness     Patrick [name] is a 65 year old male with PMHx of HTN, GERD, anxiety, fibromyalgia and OSA presenting with chest pain and belching. Patient reports daily central chest pain with associated indigestion and belching over the past 3-4 months. Patient states the pain is difficult to describe it is located approximately at the lower sternum and into the epigastric region. The pain is constant without specific exacerbating factors. He states he follows a GERD diet and does not believe anything he eats exacerbates the pain. Denies exertional chest pain. He has been taking over-the-counter antacids that provide temporary relief. He follows with a primary care provider for this complaint who has placed him on Zantac. He reports compliance with this medication. Denies taking any other daily heartburn medications. He states that he presents today because his scheduled gastroenterology follow-up is not for 2 more months and he "can't wait any longer". Denies change in the chest pain and associated symptoms today. Denies diaphoresis, dizziness, shortness of breath, nausea or vomiting with his symptoms. Denies personal history of cardiac disease. Denies family history of cardiac disease.  Chart  review: pt belongs to [facility] and is being followed for GERD since March 2017.    Past Medical History  Diagnosis Date  . Insomnia   . Herpes simplex type 1 infection   . Anxiety   . Arthritis   . Lower back pain   . BPH (benign prostatic hyperplasia)   . Dysphagia   . Morton's neuroma   . OSA (obstructive sleep apnea)   . Primary snoring   . Fibromyalgia   . Urinary frequency   . Hypertension     no longer on medication  . Depression   . Headache     migraines  . GERD (gastroesophageal reflux disease)     otc  tums  . Recurrent canker sores    Past Surgical History  Procedure Laterality Date  . Pancreatic surgery r/t trauma    . Tonsillectomy    . Transurethral incision of prostate    . Mouth surgery    . Foot surgery Left   . Eye surgery Bilateral     lasik eye surgery  . Septoplasty N/A 02/09/2015    Procedure: SEPTOPLASTY;  Surgeon: Rozetta Nunnery, MD;  Location: [place];  Service: ENT;  Laterality: N/A;  . Turbinate reduction Bilateral 02/09/2015    Procedure: BILATERAL TURBINATE REDUCTION;  Surgeon: Rozetta Nunnery, MD;  Location: [place];  Service: ENT;  Laterality: Bilateral;  . Uvulopalatopharyngoplasty N/A 02/09/2015    Procedure: UVULOPALATOPHARYNGOPLASTY (UPPP);  Surgeon: Rozetta Nunnery, MD;  Location[facility];  Service: ENT;  Laterality: N/A;   Family History  Problem Relation Age of Onset  . Coronary artery  disease Mother   . Stroke Mother   . Hypertension Father   . Anxiety disorder Brother    Social History  Substance Use Topics  . Smoking status: Former Smoker -- 2.00 packs/day for 30 years    Types: Cigarettes    Quit date: 09/01/2014  . Smokeless tobacco: None  . Alcohol Use: 0.0 oz/week    0 Standard drinks or equivalent per week     Comment: Rare    Review of Systems  Constitutional: Negative for fever and diaphoresis.  Respiratory: Negative for cough and shortness of breath.   Cardiovascular: Positive for  chest pain. Negative for palpitations, syncope and near-syncope.  Gastrointestinal: Positive for heartburn. Negative for nausea and vomiting.  Musculoskeletal: Negative for back pain.  Neurological: Negative for dizziness, weakness and numbness.  All other systems reviewed and are negative.     Allergies  Review of patient's allergies indicates no known allergies.  Home Medications   Prior to Admission medications   Medication Sig Start Date End Date Taking? Authorizing Provider  butalbital-acetaminophen-caffeine (FIORICET WITH CODEINE) 50-325-40-30 MG per capsule Take 1 capsule by mouth every 4 (four) hours as needed for headache.    Historical Provider, MD  chlorhexidine (PERIDEX) 0.12 % solution 15 mLs 2 (two) times daily. 01/15/15   Historical Provider, MD  Cholecalciferol (VITAMIN D3) 2000 UNITS TABS Take 2,000 Units by mouth daily.    Historical Provider, MD  clindamycin (CLEOCIN) 150 MG capsule Take 150 mg by mouth 2 (two) times daily.    Historical Provider, MD  clonazePAM (KLONOPIN) 0.5 MG tablet Take 0.5 mg by mouth 2 (two) times daily. 12/29/14   Historical Provider, MD  fesoterodine (TOVIAZ) 8 MG TB24 tablet Take 8 mg by mouth daily.    Historical Provider, MD  fluocinonide ointment (LIDEX) [id number] % Apply 1 application topically 4 (four) times daily.  01/13/15   Historical Provider, MD  HYDROcodone-acetaminophen (HYCET) 7.5-325 mg/15 ml solution Take 10-15 mLs by mouth every 4 (four) hours as needed for moderate pain. 02/10/15   Rozetta Nunnery, MD  HYDROcodone-acetaminophen (HYCET) 7.5-325 mg/15 ml solution Take 10-15 mLs by mouth every 6 (six) hours as needed for moderate pain. 02/10/15 02/10/16  Rozetta Nunnery, MD  HYDROcodone-acetaminophen (NORCO/VICODIN) 5-325 MG per tablet Take 1 tablet by mouth every 6 (six) hours as needed. Patient taking differently: Take 1 tablet by mouth daily as needed for moderate pain.  09/24/13   Merryl Hacker, MD   HYDROcodone-acetaminophen (NORCO/VICODIN) 5-325 MG tablet Take 1 tablet by mouth every 6 (six) hours as needed for moderate pain. 02/10/15   Rozetta Nunnery, MD  LIDOCAINE EX 1 application by Mouth Rinse route 4 (four) times daily as needed (for mouth sores).    Historical Provider, MD  Melatonin 5 MG TABS Take 5 mg by mouth at bedtime.    Historical Provider, MD  meloxicam (MOBIC) 15 MG tablet Take 15 mg by mouth daily.    Historical Provider, MD  pantoprazole (PROTONIX) 20 MG tablet Take 1 tablet (20 mg total) by mouth daily. 09/26/15   Margery Szostak, PA-C  pregabalin (LYRICA) 150 MG capsule Take 300 mg by mouth at bedtime.    Historical Provider, MD  sertraline (ZOLOFT) 100 MG tablet Take 1.5 tablets (150 mg total) by mouth daily. Patient taking differently: Take 200 mg by mouth daily.  10/23/14   Merian Capron, MD  sucralfate (CARAFATE) 1 GM/10ML suspension Take 10 mLs (1 g total) by mouth 4 (four) times daily -  with meals and at bedtime. 09/26/15   Odilon Cass, PA-C  temazepam (RESTORIL) 15 MG capsule Take one at 11pm and 2am. 09/05/14   Merian Capron, MD  traZODone (DESYREL) 100 MG tablet Take one to two tablets at night for sleep. Patient taking differently: Take 50 mg by mouth at bedtime. Take one to two tablets at night for sleep. 10/23/14   Merian Capron, MD  vitamin B-12 (CYANOCOBALAMIN) 500 MCG tablet Take 500 mcg by mouth daily.    Historical Provider, MD   BP 162/92 mmHg  Pulse 78  Temp(Src) 98.5 F (36.9 C) (Oral)  Resp 18  Ht 6' (1.829 m)  Wt 83.915 kg  BMI 25.08 kg/m2  SpO2 100% Physical Exam  Constitutional: He appears well-developed and well-nourished. No distress.  HENT:  Head: Normocephalic and atraumatic.  Eyes: Conjunctivae are normal. Right eye exhibits no discharge. Left eye exhibits no discharge. No scleral icterus.  Neck: Normal range of motion.  Cardiovascular: Normal rate, regular rhythm and normal heart sounds.   Pulmonary/Chest: Effort normal and  breath sounds normal. No respiratory distress.  Abdominal: Soft. Bowel sounds are normal. There is tenderness in the epigastric area. There is no rigidity, no rebound and no guarding.  Mild epigastric TTP without guarding or rigidity  Musculoskeletal: Normal range of motion.  Neurological: He is alert. Coordination normal.  Skin: Skin is warm and dry.  Psychiatric: He has a normal mood and affect. His behavior is normal.  Nursing note and vitals reviewed.   ED Course  Procedures (including critical care time) Labs Review Labs Reviewed  BASIC METABOLIC PANEL - Abnormal; Notable for the following:    Potassium 3.4 (*)    All other components within normal limits  CBC WITH DIFFERENTIAL/PLATELET  TROPONIN I  HEPATIC FUNCTION PANEL  LIPASE, BLOOD    Imaging Review Dg Chest 2 View  09/26/2015  CLINICAL DATA:  Chest pain for several months. EXAM: CHEST  2 VIEW COMPARISON:  Radiographs of October 13, 2014. FINDINGS: The heart size and mediastinal contours are within normal limits. Both lungs are clear. No pneumothorax or pleural effusion is noted. The visualized skeletal structures are unremarkable. IMPRESSION: No active cardiopulmonary disease. Electronically Signed   By: Marijo Conception, M.D.   On: 09/26/2015 16:02   I have personally reviewed and evaluated these images and lab results as part of my medical decision-making.   EKG Interpretation None      MDM   Final diagnoses:  Gastroesophageal reflux disease, esophagitis presence not specified  Atypical chest pain   Patient presenting with chest pain, epigastric pain, indigestion and increased belching x multiple months. Pt followed by PCP for GERD and is taking zantac. Pt has GI follow up in two months. Denies change in symptoms today. VSS. Heart RRR without murmur. Lungs CTAB. Troponin negative with non-iscehmic ECG. CXR without abnormality. Low heart score. Presentation and workup is not consistent with cardiac or pulmonary pain  origin. Symptoms treated with GI cocktail while in ED. Will add on protonix and carafate to pt's GERD regimen. Encouraged PCP follow up and keeping scheduled GI appointment. Pt requesting new PCP because he no longer likes [facility]; gave referral information for [facility]. Pt has been advised to return to the ED if CP becomes exertional, associated with diaphoresis or nausea, radiates to left jaw/arm, worsens or becomes concerning in any way. Pt appears reliable for follow up and is agreeable to discharge.   Case has been discussed with Dr. Rogene Houston who  agrees with the above plan to discharge.      Lahoma Crocker Lalaine Overstreet, PA-C 09/26/15 [place], MD 09/28/15 [id number]

## 2015-09-26 NOTE — Discharge Instructions (Signed)
Gastroesophageal Reflux Disease, Adult Normally, food travels down the esophagus and stays in the stomach to be digested. However, when a person has gastroesophageal reflux disease (GERD), food and stomach acid move back up into the esophagus. When this happens, the esophagus becomes sore and inflamed. Over time, GERD can create small holes (ulcers) in the lining of the esophagus.  CAUSES This condition is caused by a problem with the muscle between the esophagus and the stomach (lower esophageal sphincter, or LES). Normally, the LES muscle closes after food passes through the esophagus to the stomach. When the LES is weakened or abnormal, it does not close properly, and that allows food and stomach acid to go back up into the esophagus. The LES can be weakened by certain dietary substances, medicines, and medical conditions, including:  Tobacco use.  Pregnancy.  Having a hiatal hernia.  Heavy alcohol use.  Certain foods and beverages, such as coffee, chocolate, onions, and peppermint. RISK FACTORS This condition is more likely to develop in:  People who have an increased body weight.  People who have connective tissue disorders.  People who use NSAID medicines. SYMPTOMS Symptoms of this condition include:  Heartburn.  Difficult or painful swallowing.  The feeling of having a lump in the throat.  Abitter taste in the mouth.  Bad breath.  Having a large amount of saliva.  Having an upset or bloated stomach.  Belching.  Chest pain.  Shortness of breath or wheezing.  Ongoing (chronic) cough or a night-time cough.  Wearing away of tooth enamel.  Weight loss. Different conditions can cause chest pain. Make sure to see your health care provider if you experience chest pain. DIAGNOSIS Your health care provider will take a medical history and perform a physical exam. To determine if you have mild or severe GERD, your health care provider may also monitor how you respond  to treatment. You may also have other tests, including:  An endoscopy toexamine your stomach and esophagus with a small camera.  A test thatmeasures the acidity level in your esophagus.  A test thatmeasures how much pressure is on your esophagus.  A barium swallow or modified barium swallow to show the shape, size, and functioning of your esophagus. TREATMENT The goal of treatment is to help relieve your symptoms and to prevent complications. Treatment for this condition may vary depending on how severe your symptoms are. Your health care provider may recommend:  Changes to your diet.  Medicine.  Surgery. HOME CARE INSTRUCTIONS Diet  Follow a diet as recommended by your health care provider. This may involve avoiding foods and drinks such as:  Coffee and tea (with or without caffeine).  Drinks that containalcohol.  Energy drinks and sports drinks.  Carbonated drinks or sodas.  Chocolate and cocoa.  Peppermint and mint flavorings.  Garlic and onions.  Horseradish.  Spicy and acidic foods, including peppers, chili powder, curry powder, vinegar, hot sauces, and barbecue sauce.  Citrus fruit juices and citrus fruits, such as oranges, lemons, and limes.  Tomato-based foods, such as red sauce, chili, salsa, and pizza with red sauce.  Fried and fatty foods, such as donuts, french fries, potato chips, and high-fat dressings.  High-fat meats, such as hot dogs and fatty cuts of red and white meats, such as rib eye steak, sausage, ham, and bacon.  High-fat dairy items, such as whole milk, butter, and cream cheese.  Eat small, frequent meals instead of large meals.  Avoid drinking large amounts of liquid with your  meals.  Avoid eating meals during the 2-3 hours before bedtime.  Avoid lying down right after you eat.  Do not exercise right after you eat. General Instructions  Pay attention to any changes in your symptoms.  Take over-the-counter and prescription  medicines only as told by your health care provider. Do not take aspirin, ibuprofen, or other NSAIDs unless your health care provider told you to do so.  Do not use any tobacco products, including cigarettes, chewing tobacco, and e-cigarettes. If you need help quitting, ask your health care provider.  Wear loose-fitting clothing. Do not wear anything tight around your waist that causes pressure on your abdomen.  Raise (elevate) the head of your bed 6 inches (15cm).  Try to reduce your stress, such as with yoga or meditation. If you need help reducing stress, ask your health care provider.  If you are overweight, reduce your weight to an amount that is healthy for you. Ask your health care provider for guidance about a safe weight loss goal.  Keep all follow-up visits as told by your health care provider. This is important. SEEK MEDICAL CARE IF:  You have new symptoms.  You have unexplained weight loss.  You have difficulty swallowing, or it hurts to swallow.  You have wheezing or a persistent cough.  Your symptoms do not improve with treatment.  You have a hoarse voice. SEEK IMMEDIATE MEDICAL CARE IF:  You have pain in your arms, neck, jaw, teeth, or back.  You feel sweaty, dizzy, or light-headed.  You have chest pain or shortness of breath.  You vomit and your vomit looks like blood or coffee grounds.  You faint.  Your stool is bloody or black.  You cannot swallow, drink, or eat.   This information is not intended to replace advice given to you by your health care provider. Make sure you discuss any questions you have with your health care provider.   Document Released: 12/08/2004 Document Revised: 11/19/2014 Document Reviewed: 06/25/2014 Elsevier Interactive Patient Education 2016 [place] for Gastroesophageal Reflux Disease, Adult When you have gastroesophageal reflux disease (GERD), the foods you eat and your eating habits are very important.  Choosing the right foods can help ease the discomfort of GERD. WHAT GENERAL GUIDELINES DO I NEED TO FOLLOW?  Choose fruits, vegetables, whole grains, low-fat dairy products, and low-fat meat, fish, and poultry.  Limit fats such as oils, salad dressings, butter, nuts, and avocado.  Keep a food diary to identify foods that cause symptoms.  Avoid foods that cause reflux. These may be different for different people.  Eat frequent small meals instead of three large meals each day.  Eat your meals slowly, in a relaxed setting.  Limit fried foods.  Cook foods using methods other than frying.  Avoid drinking alcohol.  Avoid drinking large amounts of liquids with your meals.  Avoid bending over or lying down until 2-3 hours after eating. WHAT FOODS ARE NOT RECOMMENDED? The following are some foods and drinks that may worsen your symptoms: Vegetables Tomatoes. Tomato juice. Tomato and spaghetti sauce. Chili peppers. Onion and garlic. Horseradish. Fruits Oranges, grapefruit, and lemon (fruit and juice). Meats High-fat meats, fish, and poultry. This includes hot dogs, ribs, ham, sausage, salami, and bacon. Dairy Whole milk and chocolate milk. Sour cream. Cream. Butter. Ice cream. Cream cheese.  Beverages Coffee and tea, with or without caffeine. Carbonated beverages or energy drinks. Condiments Hot sauce. Barbecue sauce.  Sweets/Desserts Chocolate and cocoa. Donuts. Peppermint and  spearmint. Fats and Oils High-fat foods, including [place] fries and potato chips. Other Vinegar. Strong spices, such as black pepper, white pepper, red pepper, cayenne, curry powder, cloves, ginger, and chili powder. The items listed above may not be a complete list of foods and beverages to avoid. Contact your dietitian for more information.   This information is not intended to replace advice given to you by your health care provider. Make sure you discuss any questions you have with your health care  provider.   Document Released: 02/28/2005 Document Revised: 03/21/2014 Document Reviewed: 01/02/2013 Elsevier Interactive Patient Education 2016 [place].  Chest Pain Observation It is often hard to give a specific diagnosis for the cause of chest pain. Among other possibilities your symptoms might be caused by inadequate oxygen delivery to your heart (angina). Angina that is not treated or evaluated can lead to a heart attack (myocardial infarction) or death. Blood tests, electrocardiograms, and X-rays may have been done to help determine a possible cause of your chest pain. After evaluation and observation, your health care provider has determined that it is unlikely your pain was caused by an unstable condition that requires hospitalization. However, a full evaluation of your pain may need to be completed, with additional diagnostic testing as directed. It is very important to keep your follow-up appointments. Not keeping your follow-up appointments could result in permanent heart damage, disability, or death. If there is any problem keeping your follow-up appointments, you must call your health care provider. HOME CARE INSTRUCTIONS  Due to the slight chance that your pain could be angina, it is important to follow your health care provider's treatment plan and also maintain a healthy lifestyle:  Maintain or work toward achieving a healthy weight.  Stay physically active and exercise regularly.  Decrease your salt intake.  Eat a balanced, healthy diet. Talk to a dietitian to learn about heart-healthy foods.  Increase your fiber intake by including whole grains, vegetables, fruits, and nuts in your diet.  Avoid situations that cause stress, anger, or depression.  Take medicines as advised by your health care provider. Report any side effects to your health care provider. Do not stop medicines or adjust the dosages on your own.  Quit smoking. Do not use nicotine patches or gum until  you check with your health care provider.  Keep your blood pressure, blood sugar, and cholesterol levels within normal limits.  Limit alcohol intake to no more than 1 drink per day for women who are not pregnant and 2 drinks per day for men.  Do not abuse drugs. SEEK IMMEDIATE MEDICAL CARE IF: You have severe chest pain or pressure which may include symptoms such as:  You feel pain or pressure in your arms, neck, jaw, or back.  You have severe back or abdominal pain, feel sick to your stomach (nauseous), or throw up (vomit).  You are sweating profusely.  You are having a fast or irregular heartbeat.  You feel short of breath while at rest.  You notice increasing shortness of breath during rest, sleep, or with activity.  You have chest pain that does not get better after rest or after taking your usual medicine.  You wake from sleep with chest pain.  You are unable to sleep because you cannot breathe.  You develop a frequent cough or you are coughing up blood.  You feel dizzy, faint, or experience extreme fatigue.  You develop severe weakness, dizziness, fainting, or chills. Any of these symptoms may represent a  serious problem that is an emergency. Do not wait to see if the symptoms will go away. Call your local emergency services (911 in the [place] Do not drive yourself to the hospital. MAKE SURE YOU:  Understand these instructions.  Will watch your condition.  Will get help right away if you are not doing well or get worse.   This information is not intended to replace advice given to you by your health care provider. Make sure you discuss any questions you have with your health care provider.   Document Released: 04/02/2010 Document Revised: 03/05/2013 Document Reviewed: 08/30/2012 Elsevier Interactive Patient Education [employer].

## 2015-09-26 NOTE — ED Notes (Signed)
Patient states that he is having pain to his left chest x 3 - 4 months. Lots of burping, and chest discomfort

## 2015-09-28 ENCOUNTER — Encounter: Payer: Self-pay | Admitting: Internal Medicine

## 2015-10-09 ENCOUNTER — Telehealth: Payer: Self-pay | Admitting: Family Medicine

## 2015-10-09 ENCOUNTER — Encounter: Payer: Self-pay | Admitting: Family Medicine

## 2015-10-09 DIAGNOSIS — R413 Other amnesia: Secondary | ICD-10-CM | POA: Insufficient documentation

## 2015-10-09 DIAGNOSIS — R5382 Chronic fatigue, unspecified: Secondary | ICD-10-CM | POA: Insufficient documentation

## 2015-10-09 DIAGNOSIS — F411 Generalized anxiety disorder: Secondary | ICD-10-CM | POA: Insufficient documentation

## 2015-10-09 NOTE — Telephone Encounter (Signed)
Received records for [facility]- abstracted and scanned all pertinent information

## 2015-10-13 ENCOUNTER — Telehealth: Payer: Self-pay | Admitting: *Deleted

## 2015-10-13 ENCOUNTER — Encounter: Payer: Self-pay | Admitting: *Deleted

## 2015-10-13 NOTE — Telephone Encounter (Signed)
Patient not available at time of Pre-Visit Call. He will try to return call later today.

## 2015-10-13 NOTE — Telephone Encounter (Signed)
Pre-Visit Call completed with patient and chart updated.   Pre-Visit Info documented in Specialty Comments under [employer].    

## 2015-10-14 ENCOUNTER — Ambulatory Visit (INDEPENDENT_AMBULATORY_CARE_PROVIDER_SITE_OTHER): Payer: BLUE CROSS/BLUE SHIELD | Admitting: Family Medicine

## 2015-10-14 ENCOUNTER — Encounter: Payer: Self-pay | Admitting: Family Medicine

## 2015-10-14 VITALS — BP 144/90 | HR 54 | Temp 98.1°F | Ht 73.5 in | Wt 183.6 lb

## 2015-10-14 DIAGNOSIS — R5382 Chronic fatigue, unspecified: Secondary | ICD-10-CM | POA: Diagnosis not present

## 2015-10-14 DIAGNOSIS — J3089 Other allergic rhinitis: Secondary | ICD-10-CM

## 2015-10-14 DIAGNOSIS — L739 Follicular disorder, unspecified: Secondary | ICD-10-CM

## 2015-10-14 DIAGNOSIS — G894 Chronic pain syndrome: Secondary | ICD-10-CM

## 2015-10-14 DIAGNOSIS — K219 Gastro-esophageal reflux disease without esophagitis: Secondary | ICD-10-CM | POA: Diagnosis not present

## 2015-10-14 DIAGNOSIS — F411 Generalized anxiety disorder: Secondary | ICD-10-CM

## 2015-10-14 MED ORDER — MELOXICAM 15 MG PO TABS: 15.0000 mg | ORAL_TABLET | Freq: Every day | ORAL | 3 refills | Status: DC

## 2015-10-14 MED ORDER — CEPHALEXIN 500 MG PO CAPS: 500.0000 mg | ORAL_CAPSULE | Freq: Two times a day (BID) | ORAL | 0 refills | Status: DC

## 2015-10-14 MED ORDER — METHYLPHENIDATE HCL 20 MG PO TABS: 20.0000 mg | ORAL_TABLET | Freq: Two times a day (BID) | ORAL | 0 refills | Status: DC

## 2015-10-14 MED ORDER — SUCRALFATE 1 G PO TABS: 1.0000 g | ORAL_TABLET | Freq: Three times a day (TID) | ORAL | 1 refills | Status: DC

## 2015-10-14 MED ORDER — RANITIDINE HCL 300 MG PO TABS: 300.0000 mg | ORAL_TABLET | Freq: Every day | ORAL | 3 refills | Status: DC

## 2015-10-14 MED ORDER — FLUTICASONE PROPIONATE 50 MCG/ACT NA SUSP: 2.0000 | Freq: Every day | NASAL | 3 refills | Status: AC

## 2015-10-14 MED ORDER — OMEPRAZOLE 40 MG PO CPDR: 40.0000 mg | DELAYED_RELEASE_CAPSULE | Freq: Every day | ORAL | 3 refills | Status: DC

## 2015-10-14 MED ORDER — SERTRALINE HCL 100 MG PO TABS: 200.0000 mg | ORAL_TABLET | Freq: Every day | ORAL | 3 refills | Status: DC

## 2015-10-14 MED ORDER — LYRICA 100 MG PO CAPS: 200.0000 mg | ORAL_CAPSULE | Freq: Every day | ORAL | 2 refills | Status: DC

## 2015-10-14 NOTE — Progress Notes (Signed)
Pre visit review using our clinic review tool, if applicable. No additional management support is needed unless otherwise documented below in the visit note. 

## 2015-10-14 NOTE — Patient Instructions (Addendum)
We will check your breath test today; if this is negative you may want to have a biopsy done when you see GI in September  Use the keflex twice a day for one week to clear up the area on your chest. This will also take care of any infection on your ankle.  However I do think that your ankle lesion will continue to heal and is doing ok at this time  If you do not wish to see Dr. Casimiro Needle, I would encourage you to find another psychiatrist to help with your mental health care.  You might try looking in [place] to find a provider as we really do not have enough psychiatrists in [place]  Please let me know when you need further refills.  I would ask you to get your ritalin from only one provider; I can do this for you if you like.

## 2015-10-14 NOTE — Progress Notes (Signed)
[place] at [facility] [address], [place], [place] [address] [phone]  Date:  10/14/2015   Name:  Patrick [name]   DOB:  11-09-1950   MRN:  [id number]  PCP:  Lamar Blinks, MD    Chief Complaint: Establish Care (stomach issues,insect bite on (L) lower leg-x 1 month-sore and red)   History of Present Illness:  Patrick [name] is a 65 y.o.  male patient who presents with the following:  Here today to establish care as a new patient.  He has a history of OSA, anxiety, chronic fatigue, chronic pain Labs done 09/26/2015 when he was in the ER with chest pain - negative ACS work up, thought to be due to GERD.  He states that he had epigastric pain which has been present for 3-4 months. He was given a GI cocktail in the ER which did relieve his pain.  He is to see GI but appt not until September.  He has been seen by GI years ago- never had an ulcer per his knowledge.   He was using caraftate liquid but would like to use the tablet instead.  The carafate does help He did have endoscopy but he is not sure when this was done.   He would like an h pylori test today- he is still on PPI which may cause a false negative, but he would like to go ahead and try the test  He notes a lesion on his left ankle that has been present for about one month. He thinks it is a spider bite. It started out as a red bite, then become painful. It never drained anything. It did have a large scab but is slowly healing He does have a few areas on his chest that have been present for a few weeks as well. He has not been doing any yard work.    No fever or chills.   He does get migraine headaches; he had one a couple of weeks ago. He uses fioicet for this and went to bed- the HA was better in the am.  He does not get aura.    He states that he has anxiety, FBM, chronic back pain. He has arthritis of his back- never needed surgery.  He has had an MRI of  his back.   He is seeing a psychiatrist in [place] right now but he does not like them, wants to change He is on zoloft, klonopin, lyrica per his psychiatrist His pain meds are managed by a sports med doctor who also gives his cortisone shots in his back.  A shot may help him for 3-6 months,and he uses hydrocodone as needed for flare up of pain  Reviewed [facility]- his past PCP Dr. Creig Hines has been writing for his ritalin and lyrica, his psychiatrist Dr. Casimiro Needle is rx klonpin.  No narcotics on registry for the last 6 months  He has one grandaughter who will be 2 this fall  He takes ritalin twice daily for his chronic fatigue.  Reports that this was being written by his general practicioner.   Patrick [name] was quite upset that he was brought back 30 minutes after his appoint time and was curt with staff.  Explained to him that I was helping a patient with an acute need and apologized for keeping him waiting.  Explained that although my goal is to be on time I will give each patient the time that is needed  to address any acute issues.  I was not able to guarantee to never be late again, and explained that he is certainly free to seek another provider if he would like.     Patient Active Problem List   Diagnosis Date Noted  . Chronic fatigue 10/09/2015  . Generalized anxiety disorder 10/09/2015  . Memory loss 10/09/2015  . Obstructive sleep apnea of adult 02/09/2015  . Benign prostatic hyperplasia with urinary obstruction 11/18/2013  . Acid reflux 11/18/2013  . Degenerative arthritis of lumbar spine 11/18/2013  . Headache, migraine 11/18/2013  . Obstructive apnea 11/18/2013  . External hemorrhoids with other complication [id number]  . Restless leg 05/27/2009    Past Medical History:  Diagnosis Date  . Anxiety   . Arthritis   . BPH (benign prostatic hyperplasia)   . Depression   . Dysphagia   . Fibromyalgia   . GERD (gastroesophageal reflux disease)    otc  tums  . Headache     migraines  . Herpes simplex type 1 infection   . Hypertension    no longer on medication  . Insomnia   . Lower back pain   . Morton's neuroma   . OSA (obstructive sleep apnea)   . Primary snoring   . Recurrent canker sores   . Urinary frequency     Past Surgical History:  Procedure Laterality Date  . EPIDIDYMECTOMY     for spermatocele  . EYE SURGERY Bilateral    lasik eye surgery  . FOOT SURGERY Left   . INGUINAL HERNIA REPAIR Left    01/25/1999  . MOUTH SURGERY    . pancreatic surgery r/t trauma    . SEPTOPLASTY N/A 02/09/2015   Procedure: SEPTOPLASTY;  Surgeon: Rozetta Nunnery, MD;  Location: [place];  Service: ENT;  Laterality: N/A;  . TONSILLECTOMY    . TRANSURETHRAL INCISION OF PROSTATE    . TURBINATE REDUCTION Bilateral 02/09/2015   Procedure: BILATERAL TURBINATE REDUCTION;  Surgeon: Rozetta Nunnery, MD;  Location: [facility];  Service: ENT;  Laterality: Bilateral;  . UVULOPALATOPHARYNGOPLASTY N/A 02/09/2015   Procedure: UVULOPALATOPHARYNGOPLASTY (UPPP);  Surgeon: Rozetta Nunnery, MD;  [facility];  Service: ENT;  Laterality: N/A;    Social History  Substance Use Topics  . Smoking status: Former Smoker    Packs/day: 2.00    Years: 30.00    Types: Cigarettes    Quit date: 09/01/2014  . Smokeless tobacco: Not on file  . Alcohol use 0.0 oz/week     Comment: Rare    Family History  Problem Relation Age of Onset  . Coronary artery disease Mother   . Stroke Mother   . Hypertension Father   . Anxiety disorder Brother   . Skin cancer Brother     No Known Allergies  Medication list has been reviewed and updated.  Current Outpatient Prescriptions on File Prior to Visit  Medication Sig Dispense Refill  . butalbital-acetaminophen-caffeine (FIORICET WITH CODEINE) 50-325-40-30 MG per capsule Take 1 capsule by mouth every 4 (four) hours as needed for headache.    . chlorhexidine (PERIDEX) 0.12 % solution 15 mLs 2 (two) times daily.  0  .  Cholecalciferol (VITAMIN D3) 2000 UNITS TABS Take 2,000 Units by mouth daily.    . clonazePAM (KLONOPIN) 0.5 MG tablet Take 0.5 mg by mouth 2 (two) times daily.  2  . fesoterodine (TOVIAZ) 8 MG TB24 tablet Take 8 mg by mouth daily.    . fluocinonide ointment (LIDEX) 0.05 % Apply  1 application topically 4 (four) times daily.   0  . fluticasone (FLONASE) 50 MCG/ACT nasal spray Place into the nose.    Marland Kitchen HYDROcodone-acetaminophen (HYCET) 7.5-325 mg/15 ml solution Take 10-15 mLs by mouth every 4 (four) hours as needed for moderate pain. 120 mL 0  . HYDROcodone-acetaminophen (HYCET) 7.5-325 mg/15 ml solution Take 10-15 mLs by mouth every 6 (six) hours as needed for moderate pain. 420 mL 0  . LIDOCAINE EX 1 application by Mouth Rinse route 4 (four) times daily as needed (for mouth sores).    . Melatonin 5 MG TABS Take 5 mg by mouth at bedtime.    . meloxicam (MOBIC) 15 MG tablet Take 15 mg by mouth daily.    . ranitidine (ZANTAC) 300 MG tablet Take 300 mg by mouth.    . traZODone (DESYREL) 100 MG tablet Take one to two tablets at night for sleep. (Patient taking differently: Take 50 mg by mouth at bedtime. Take one to two tablets at night for sleep.) 30 tablet 1  . vitamin B-12 (CYANOCOBALAMIN) 500 MCG tablet Take 500 mcg by mouth daily.    . clindamycin (CLEOCIN) 150 MG capsule Take 150 mg by mouth 2 (two) times daily.    Marland Kitchen HYDROcodone-acetaminophen (NORCO/VICODIN) 5-325 MG per tablet Take 1 tablet by mouth every 6 (six) hours as needed. (Patient not taking: Reported on 10/14/2015) 10 tablet 0  . HYDROcodone-acetaminophen (NORCO/VICODIN) 5-325 MG tablet Take 1 tablet by mouth every 6 (six) hours as needed for moderate pain. (Patient not taking: Reported on 10/14/2015) 30 tablet 0  . sertraline (ZOLOFT) 100 MG tablet Take 1.5 tablets (150 mg total) by mouth daily. (Patient taking differently: Take 200 mg by mouth daily. ) 45 tablet 1  . sucralfate (CARAFATE) 1 GM/10ML suspension Take 10 mLs (1 g total) by  mouth 4 (four) times daily -  with meals and at bedtime. (Patient not taking: Reported on 10/14/2015) 420 mL 0  . temazepam (RESTORIL) 15 MG capsule Take one at 11pm and 2am. (Patient not taking: Reported on 10/13/2015) 60 capsule 1   No current facility-administered medications on file prior to visit.     Review of Systems:  As per HPI- otherwise negative.   Physical Examination:  BP Readings from Last 3 Encounters:  10/14/15 (!) 144/90  09/26/15 124/89  02/10/15 114/82   Pulse Readings from Last 3 Encounters:  10/14/15 (!) 54  09/26/15 (!) 48  02/10/15 87    Vitals:   10/14/15 1001  Pulse: (!) 54  Temp: 98.1 F (36.7 C)   Vitals:   10/14/15 1001  Weight: 183 lb 9.6 oz (83.3 kg)  Height: 6' 1.5" (1.867 m)   Body mass index is 23.89 kg/m. Ideal Body Weight: Weight in (lb) to have BMI = 25: 191.7  GEN: WDWN, NAD, Non-toxic, A & O x 3, normal weight, looks well HEENT: Atraumatic, Normocephalic. Neck supple. No masses, No LAD. Bilateral TM wnl, oropharynx normal.  PEERL,EOMI.   Ears and Nose: No external deformity. CV: RRR, No M/G/R. No JVD. No thrill. No extra heart sounds. PULM: CTA B, no wheezes, crackles, rhonchi. No retractions. No resp. distress. No accessory muscle use. ABD: S, NT, ND, +BS. No rebound. No HSM. Mild folliculitis across his chest.  No pustules EXTR: No c/c/e NEURO Normal gait.  PSYCH: normal thought content, no hallucinations.  Seems to have a lot of anger and expresses a generally negative outlook Mild folliculitis across chest.  Left anterior ankle:  What appears  to be a healing bug bite or other small lesion, no evidence of infection  Assessment and Plan: Gastroesophageal reflux disease, esophagitis presence not specified - Plan: H. pylori breath test, sucralfate (CARAFATE) 1 g tablet, omeprazole (PRILOSEC) 40 MG capsule, ranitidine (ZANTAC) [id number] MG tablet  Folliculitis - Plan: cephALEXin (KEFLEX) 500 MG capsule  Chronic pain syndrome - Plan:  LYRICA 100 MG capsule, meloxicam (MOBIC) 15 MG tablet  Chronic fatigue - Plan: methylphenidate (RITALIN) 20 MG tablet, methylphenidate (RITALIN) 20 MG tablet, DISCONTINUED: methylphenidate (RITALIN) 20 MG tablet  GAD (generalized anxiety disorder) - Plan: sertraline (ZOLOFT) 100 MG tablet  Other allergic rhinitis - Plan: fluticasone (FLONASE) 50 MCG/ACT nasal spray  Here today as a new patient to discuss several issues His main concern is gerd that has been giving him persistent epigastric pain for months.  He is on an H2 blocker and a PPI.  carafate has been helpful for him- will refill.  He would like to do H pylori test- will do today Folliculitis on chest and lesion on ankle- keflex for one week Refilled his lyrica and mobic Also refilled his ritalin.  Did not do a UDS/ contract today as I am not sure he will be coming back.  Asked him to continue to get his other controlled meds from his current providers Did refill his sertraline  Signed Lamar Blinks, MD

## 2015-10-16 ENCOUNTER — Other Ambulatory Visit: Payer: BLUE CROSS/BLUE SHIELD

## 2015-10-19 ENCOUNTER — Telehealth: Payer: Self-pay | Admitting: Family Medicine

## 2015-10-19 LAB — H. PYLORI BREATH TEST: H. PYLORI BREATH TEST: NOT DETECTED

## 2015-10-19 MED ORDER — SUCRALFATE 1 GM/10ML PO SUSP: 1.0000 g | Freq: Three times a day (TID) | ORAL | 0 refills | Status: DC

## 2015-10-19 NOTE — Telephone Encounter (Signed)
Caller name: Wells Guiles  Relation to pt: [facility] Call back number: [phone]  Pharmacy: reference # [id number]  Reason for call:  Mail order in need of clarification regarding ALPRAZolam (XANAX) 0.25 MG tablet. Please advise

## 2015-10-19 NOTE — Telephone Encounter (Signed)
[facility]ed [facility] to see exactly what they need clarification. Per [facility] they need clarification on if the patient's current therapy should be Pantoprazole, Omeprazole or Ranitidine. Called back number is: [phone] and reference # is [id number].

## 2015-10-20 ENCOUNTER — Telehealth: Payer: Self-pay | Admitting: Emergency Medicine

## 2015-10-20 NOTE — Telephone Encounter (Signed)
[facility]ed [facility]- he is not on pantoprazole but he is on omeprazole and also ranitidine

## 2015-10-20 NOTE — Telephone Encounter (Signed)
Calling back regarding medication below best # [phone] reference # [id number] SD

## 2015-10-22 ENCOUNTER — Other Ambulatory Visit: Payer: Self-pay | Admitting: Family Medicine

## 2015-10-26 MED ORDER — SUCRALFATE 1 GM/10ML PO SUSP: 1.0000 g | Freq: Three times a day (TID) | ORAL | 0 refills | Status: DC

## 2015-10-26 NOTE — Addendum Note (Signed)
Addended by: Lamar Blinks C on: 10/26/2015 09:30 AM   Modules accepted: Orders

## 2015-10-26 NOTE — Telephone Encounter (Signed)
Meds ordered this encounter  Medications  . DISCONTD: sucralfate (CARAFATE) 1 GM/10ML suspension    Sig: Take 10 mLs (1 g total) by mouth 4 (four) times daily -  with meals and at bedtime.    Dispense:  420 mL    Refill:  0  . sucralfate (CARAFATE) 1 GM/10ML suspension    Sig: Take 10 mLs (1 g total) by mouth 4 (four) times daily -  with meals and at bedtime.    Dispense:  420 mL    Refill:  0

## 2015-10-29 NOTE — Telephone Encounter (Signed)
error 

## 2015-10-30 ENCOUNTER — Ambulatory Visit (INDEPENDENT_AMBULATORY_CARE_PROVIDER_SITE_OTHER): Payer: BLUE CROSS/BLUE SHIELD | Admitting: Gastroenterology

## 2015-10-30 ENCOUNTER — Encounter: Payer: Self-pay | Admitting: Gastroenterology

## 2015-10-30 VITALS — BP 114/72 | HR 72 | Ht 72.0 in | Wt 186.2 lb

## 2015-10-30 DIAGNOSIS — R1314 Dysphagia, pharyngoesophageal phase: Secondary | ICD-10-CM | POA: Diagnosis not present

## 2015-10-30 NOTE — Progress Notes (Signed)
HPI: This is a  pleasant 65 year old man  who was referred to me by Copland, Gay Filler, MD  to evaluate  left upper quadrant, low left chest pain .    Chief complaint is left upper quadrant, low left chest pain  Blood work July 2017: CBC, complete metabolic profile, H. Pylori breath test, lipase were all normal.  Went to ER 7/17 for chest pain, negative ACS workup. Was given GI cocktail in ER and this helped his pain.    He had EGD in the past, not sure who, when, where it was done or what was found (10-15 years ago)  Has had LUQ, low left rib pains.  This has been going on for 4 months.  Like a sharp, stabbing pain.  Intermittent pains.  No associated nasuea. Not assoicated with eating except for red sauces.    He also had a solid food dysphagia, for 2 years.  Liquids are fine.  A few minutes after eating he feels he will vomit, or regurgitation  Overall his weight has been up 4 pounds in past 4 weeks.  Taking zantac, he thinks in AM  Takes omeprazole, stopped because he heard it could cause liver failure.  Takes hydrocodone  Takes mobic once dailyt  Review of systems: Pertinent positive and negative review of systems were noted in the above HPI section. Complete review of systems was performed and was otherwise normal.   Past Medical History:  Diagnosis Date  . Anxiety   . Arthritis   . Basal cell carcinoma   . BPH (benign prostatic hyperplasia)   . Depression   . Dysphagia   . Fibromyalgia   . GERD (gastroesophageal reflux disease)    otc  tums  . Headache    migraines  . Herpes simplex type 1 infection   . History of migraine   . Hypertension    no longer on medication  . Insomnia   . Lower back pain   . Morton's neuroma   . OSA (obstructive sleep apnea)   . Primary snoring   . Recurrent canker sores   . Urinary frequency     Past Surgical History:  Procedure Laterality Date  . EPIDIDYMECTOMY     for spermatocele  . EYE SURGERY Bilateral    lasik  eye surgery  . FOOT SURGERY Left   . INGUINAL HERNIA REPAIR Left    01/25/1999  . MOUTH SURGERY    . pancreatic surgery r/t trauma    . SEPTOPLASTY N/A 02/09/2015   Procedure: SEPTOPLASTY;  Surgeon: Rozetta Nunnery, MD;  Location: [place];  Service: ENT;  Laterality: N/A;  . SKIN CANCER EXCISION    . TONSILLECTOMY    . TRANSURETHRAL INCISION OF PROSTATE    . TURBINATE REDUCTION Bilateral 02/09/2015   Procedure: BILATERAL TURBINATE REDUCTION;  Surgeon: Rozetta Nunnery, MD;  Location: [place];  Service: ENT;  Laterality: Bilateral;  . UVULOPALATOPHARYNGOPLASTY N/A 02/09/2015   Procedure: UVULOPALATOPHARYNGOPLASTY (UPPP);  Surgeon: Rozetta Nunnery, MD;  Location: [place];  Service: ENT;  Laterality: N/A;    Current Outpatient Prescriptions  Medication Sig Dispense Refill  . butalbital-acetaminophen-caffeine (FIORICET WITH CODEINE) 50-325-40-30 MG per capsule Take 1 capsule by mouth every 4 (four) hours as needed for headache.    . chlorhexidine (PERIDEX) 0.12 % solution 15 mLs 2 (two) times daily.  0  . Cholecalciferol (VITAMIN D3) 2000 UNITS TABS Take 2,000 Units by mouth daily.    . clonazePAM (KLONOPIN) 0.5 MG tablet  Take 0.5 mg by mouth 2 (two) times daily.  2  . fesoterodine (TOVIAZ) 8 MG TB24 tablet Take 8 mg by mouth daily.    . fluocinonide ointment (LIDEX) [id number] % Apply 1 application topically 4 (four) times daily.   0  . fluticasone (FLONASE) 50 MCG/ACT nasal spray Place 2 sprays into both nostrils daily. 43 g 3  . HYDROcodone-acetaminophen (HYCET) 7.5-325 mg/15 ml solution Take 10-15 mLs by mouth every 6 (six) hours as needed for moderate pain. 420 mL 0  . HYDROcodone-acetaminophen (NORCO/VICODIN) 5-325 MG tablet Take 1 tablet by mouth every 6 (six) hours as needed for moderate pain. 30 tablet 0  . LIDOCAINE EX 1 application by Mouth Rinse route 4 (four) times daily as needed (for mouth sores).    Marland Kitchen LYRICA 100 MG capsule Take 2 capsules (200 mg total) by mouth at bedtime.  180 capsule 2  . Melatonin 5 MG TABS Take 5 mg by mouth at bedtime.    . meloxicam (MOBIC) 15 MG tablet Take 1 tablet (15 mg total) by mouth daily. 90 tablet 3  . methylphenidate (RITALIN) 20 MG tablet Take 1 tablet (20 mg total) by mouth 2 (two) times daily. Ok to fill in 30 days 60 tablet 0  . methylphenidate (RITALIN) 20 MG tablet Take 1 tablet (20 mg total) by mouth 2 (two) times daily. 60 tablet 0  . omeprazole (PRILOSEC) 40 MG capsule Take 1 capsule (40 mg total) by mouth daily. 90 capsule 3  . ranitidine (ZANTAC) 300 MG tablet Take 1 tablet (300 mg total) by mouth daily. 90 tablet 3  . sertraline (ZOLOFT) 100 MG tablet Take 2 tablets (200 mg total) by mouth daily. 180 tablet 3  . sucralfate (CARAFATE) 1 g tablet Take 1 tablet (1 g total) by mouth 4 (four) times daily -  with meals and at bedtime. 60 tablet 1  . sucralfate (CARAFATE) 1 GM/10ML suspension Take 10 mLs (1 g total) by mouth 4 (four) times daily -  with meals and at bedtime. 420 mL 0  . sucralfate (CARAFATE) 1 GM/10ML suspension Take 10 mLs (1 g total) by mouth 4 (four) times daily -  with meals and at bedtime. 420 mL 0  . traZODone (DESYREL) 100 MG tablet Take one to two tablets at night for sleep. (Patient taking differently: Take 50 mg by mouth at bedtime. Take one to two tablets at night for sleep.) 30 tablet 1  . traZODone (DESYREL) 50 MG tablet Take 1 tablet by mouth at bedtime.    . vitamin B-12 (CYANOCOBALAMIN) 500 MCG tablet Take 500 mcg by mouth daily.     No current facility-administered medications for this visit.     Allergies as of 10/30/2015  . (No Known Allergies)    Family History  Problem Relation Age of Onset  . Coronary artery disease Mother   . Stroke Mother   . Hypertension Father   . Anxiety disorder Brother   . Skin cancer Brother     Social History   Social History  . Marital status: Married    Spouse name: N/A  . Number of children: 2  . Years of education: BS    Occupational History   . engineer    Social History Main Topics  . Smoking status: Former Smoker    Packs/day: 2.00    Years: 30.00    Types: Cigarettes    Quit date: 10/29/2005  . Smokeless tobacco: Never Used  . Alcohol use 0.0 oz/week  Comment: Rare  . Drug use: No  . Sexual activity: Not on file   Other Topics Concern  . Not on file   Social History Narrative   Denies caffeine use      Physical Exam: Ht 6' (1.829 m)   Wt 186 lb 4 oz (84.5 kg)   BMI 25.26 kg/m  Constitutional: generally well-appearing Psychiatric: alert and oriented x3 Eyes: extraocular movements intact Mouth: oral pharynx moist, no lesions Neck: supple no lymphadenopathy Cardiovascular: heart regular rate and rhythm Lungs: clear to auscultation bilaterally Abdomen: soft, nontender, nondistended, no obvious ascites, no peritoneal signs, normal bowel sounds Extremities: no lower extremity edema bilaterally Skin: no lesions on visible extremities   Assessment and plan: 65 y.o. male with  Left upper quadrant, low left rib pain  His maximal point of tenderness is low left sided rib. It doesn't really seem like he has tenderness deep to this. That suggests that this is not really a gastrointestinal related pain, tenderness. He does however have some improvement of this symptom when he takes GI cocktail, Carafate. He also has some chronic nonprogressive mild solid food dysphagia without weight loss. I suggested we proceed with EGD at his soonest convenience. He is not taking his antiacid medicines at the correct time and so I instructed him on better way to take the omeprazole and ranitidine. I see no reason for any further blood tests or imaging studies prior to upper endoscopy described above.   Owens Loffler, MD [facility] 10/30/2015, 8:46 AM  Cc: Darreld Mclean, MD

## 2015-10-30 NOTE — Patient Instructions (Addendum)
You will be set up for an upper endoscopy for dysphagia. Omeprazole 40mg  should be take 20-30 min before breakfast meal. Zantac should be taken at bedtime. Continue carafate as you are already taking.

## 2015-11-03 ENCOUNTER — Ambulatory Visit (AMBULATORY_SURGERY_CENTER): Payer: BLUE CROSS/BLUE SHIELD | Admitting: Gastroenterology

## 2015-11-03 ENCOUNTER — Encounter: Payer: Self-pay | Admitting: Gastroenterology

## 2015-11-03 VITALS — BP 98/58 | HR 57 | Temp 97.5°F | Resp 13 | Ht 72.0 in | Wt 186.0 lb

## 2015-11-03 DIAGNOSIS — K2990 Gastroduodenitis, unspecified, without bleeding: Secondary | ICD-10-CM

## 2015-11-03 DIAGNOSIS — K2950 Unspecified chronic gastritis without bleeding: Secondary | ICD-10-CM | POA: Diagnosis not present

## 2015-11-03 DIAGNOSIS — R1310 Dysphagia, unspecified: Secondary | ICD-10-CM

## 2015-11-03 DIAGNOSIS — K2970 Gastritis, unspecified, without bleeding: Secondary | ICD-10-CM

## 2015-11-03 DIAGNOSIS — R1314 Dysphagia, pharyngoesophageal phase: Secondary | ICD-10-CM

## 2015-11-03 DIAGNOSIS — R1319 Other dysphagia: Secondary | ICD-10-CM

## 2015-11-03 MED ORDER — SODIUM CHLORIDE 0.9 % IV SOLN: 500.0000 mL | INTRAVENOUS | Status: DC

## 2015-11-03 NOTE — Progress Notes (Signed)
Called to room to assist during endoscopic procedure.  Patient ID and intended procedure confirmed with present staff. Received instructions for my participation in the procedure from the performing physician.  

## 2015-11-03 NOTE — Patient Instructions (Signed)
YOU HAD AN ENDOSCOPIC PROCEDURE TODAY AT THE [facility]:   Refer to the procedure report that was given to you for any specific questions about what was found during the examination.  If the procedure report does not answer your questions, please call your gastroenterologist to clarify.  If you requested that your care partner not be given the details of your procedure findings, then the procedure report has been included in a sealed envelope for you to review at your convenience later.  YOU SHOULD EXPECT: Some feelings of bloating in the abdomen. Passage of more gas than usual.  Walking can help get rid of the air that was put into your GI tract during the procedure and reduce the bloating.   Please Note:  You might notice some irritation and congestion in your nose or some drainage.  This is from the oxygen used during your procedure.  There is no need for concern and it should clear up in a day or so.  SYMPTOMS TO REPORT IMMEDIATELY:    Following upper endoscopy (EGD)  Vomiting of blood or coffee ground material  New chest pain or pain under the shoulder blades  Painful or persistently difficult swallowing  New shortness of breath  Fever of 100F or higher  Black, tarry-looking stools  For urgent or emergent issues, a gastroenterologist can be reached at any hour by calling [phone].   DIET:  We do recommend a small meal at first, but then you may proceed to your regular diet.  Drink plenty of fluids but you should avoid alcoholic beverages for 24 hours.  ACTIVITY:  You should plan to take it easy for the rest of today and you should NOT DRIVE or use heavy machinery until tomorrow (because of the sedation medicines used during the test).    FOLLOW UP: Our staff will call the number listed on your records the next business day following your procedure to check on you and address any questions or concerns that you may have regarding the information given to you  following your procedure. If we do not reach you, we will leave a message.  However, if you are feeling well and you are not experiencing any problems, there is no need to return our call.  We will assume that you have returned to your regular daily activities without incident.  If any biopsies were taken you will be contacted by phone or by letter within the next 1-3 weeks.  Please call us at [phone] if you have not heard about the biopsies in 3 weeks.    SIGNATURES/CONFIDENTIALITY: You and/or your care partner have signed paperwork which will be entered into your electronic medical record.  These signatures attest to the fact that that the information above on your After Visit Summary has been reviewed and is understood.  Full responsibility of the confidentiality of this discharge information lies with you and/or your care-partner.  Read all handouts given to you by your recovery room nurse.  Thank-you for choosing us for your healthcare needs today. 

## 2015-11-03 NOTE — Progress Notes (Signed)
Report to PACU, RN, vss, BBS= Clear.  

## 2015-11-03 NOTE — Op Note (Signed)
[facility] Patient Name: Patrick [name] Procedure Date: 11/03/2015 10:13 AM MRN: [id number] Endoscopist: Milus Banister , MD Age: 65 Referring MD:  Date of Birth: 1951/01/11 Gender: Male Account #: [id number] Procedure:                Upper GI endoscopy Indications:              Abdominal pain in the left upper quadrant, Dysphagia Medicines:                Monitored Anesthesia Care Procedure:                Pre-Anesthesia Assessment:                           - Prior to the procedure, a History and Physical                            was performed, and patient medications and                            allergies were reviewed. The patient's tolerance of                            previous anesthesia was also reviewed. The risks                            and benefits of the procedure and the sedation                            options and risks were discussed with the patient.                            All questions were answered, and informed consent                            was obtained. Prior Anticoagulants: The patient has                            taken no previous anticoagulant or antiplatelet                            agents. ASA Grade Assessment: II - A patient with                            mild systemic disease. After reviewing the risks                            and benefits, the patient was deemed in                            satisfactory condition to undergo the procedure.                           After obtaining informed consent, the endoscope was  passed under direct vision. Throughout the                            procedure, the patient's blood pressure, pulse, and                            oxygen saturations were monitored continuously. The                            Model GIF-[id number]) scope was introduced                            through the mouth, and advanced to the second part      of duodenum. The upper GI endoscopy was                            accomplished without difficulty. The patient                            tolerated the procedure well. Scope In: Scope Out: Findings:                 The esophagus was normal.                           Diffuse mild inflammation characterized by erythema                            and granularity was found in the entire examined                            stomach. Biopsies were taken with a cold forceps                            for histology.                           The examined duodenum was normal. Complications:            No immediate complications. Estimated blood loss:                            None. Estimated Blood Loss:     Estimated blood loss: none. Impression:               - Normal esophagus.                           - Gastritis. Biopsied.                           - Normal examined duodenum. Recommendation:           - Patient has a contact number available for                            emergencies. The signs and symptoms of potential  delayed complications were discussed with the                            patient. Return to normal activities tomorrow.                            Written discharge instructions were provided to the                            patient.                           - Resume previous diet.                           - Continue present medications.                           - Await pathology results. If biopsies show H.                            pylori you will be started on appropriate                            antibiotics. Milus Banister, MD 11/03/2015 10:27:15 AM This report has been signed electronically.

## 2015-11-04 ENCOUNTER — Telehealth: Payer: Self-pay | Admitting: *Deleted

## 2015-11-04 ENCOUNTER — Encounter: Payer: Self-pay | Admitting: Family Medicine

## 2015-11-04 NOTE — Telephone Encounter (Signed)
  Follow up Call-  Call back number 11/03/2015  Post procedure Call Back phone  # [phone]  Permission to leave phone message Yes  Some recent data might be hidden     Patient questions:  Do you have a fever, pain , or abdominal swelling? No. Pain Score  0 *  Have you tolerated food without any problems? Yes.    Have you been able to return to your normal activities? Yes.    Do you have any questions about your discharge instructions: Diet   No. Medications  No. Follow up visit  Yes.    Do you have questions or concerns about your Care? No.  Actions: * If pain score is 4 or above: No action needed, pain <4.  Pt. Stated that he still have pain yesterday,the pain has subsided this morning.pt. Then had questions about what the doctor plans was if biopsy came back o.k. Explained that he would need to follow up with doctor once results come back if he continues to have problems,pt. Verbalize understanding.

## 2015-11-09 MED ORDER — HYDROCODONE-ACETAMINOPHEN 5-325 MG PO TABS: 1.0000 | ORAL_TABLET | Freq: Three times a day (TID) | ORAL | 0 refills | Status: DC | PRN

## 2015-11-10 ENCOUNTER — Encounter: Payer: Self-pay | Admitting: Gastroenterology

## 2015-11-11 ENCOUNTER — Encounter: Payer: Self-pay | Admitting: Family Medicine

## 2015-11-11 ENCOUNTER — Ambulatory Visit (INDEPENDENT_AMBULATORY_CARE_PROVIDER_SITE_OTHER): Payer: BLUE CROSS/BLUE SHIELD | Admitting: Family Medicine

## 2015-11-11 VITALS — BP 106/72 | HR 58 | Temp 97.9°F | Ht 72.0 in | Wt 182.4 lb

## 2015-11-11 DIAGNOSIS — R1013 Epigastric pain: Secondary | ICD-10-CM | POA: Diagnosis not present

## 2015-11-11 DIAGNOSIS — G8929 Other chronic pain: Secondary | ICD-10-CM

## 2015-11-11 NOTE — Progress Notes (Signed)
Pre visit review using our clinic review tool, if applicable. No additional management support is needed unless otherwise documented below in the visit note. 

## 2015-11-11 NOTE — Patient Instructions (Addendum)
I spoke with Dr. Ardis Hughs about your recent upper GI pathology results.  You do have some mild chronic gastritis, but this is not necessarily enough to explain your symptoms.  We are going to go ahead with a CT scan of your abdomen and pelvis to look for any other possible cause. In the meantime please increase your prilosec to twice a day- you can continue ranitidine and also carafate as needed.  If you have any change or worsening in your symptoms please let me know!  We will schedule the CT scan asap, let me know if you do not hear about this in the next 1-2 days.

## 2015-11-11 NOTE — Progress Notes (Signed)
[facility] at [facility] [address], [place], [place] [address] [id number] [phone]  Date:  11/11/2015   Name:  Patrick [name]   DOB:  1950/05/12   MRN:  [id number]  PCP:  Lamar Blinks, MD    Chief Complaint: Abdominal Pain (c/o constant abd pain. pt had endoscopy last week still waiting on results. )   History of Present Illness:  Patrick [name] is a 65 y.o. very pleasant male patient who presents with the following:  He is here today to follow-up on his stomach issues.  He was first seen in the ER for his on 7/15.  He notes that his stomach concerns started this spring. He has had some milder stomach symptmos in the past but never so persistent or severe.   The pain is not at all exertional. He notes a dull, epigastric pain that can wax and wane.   He is sometimes not sleeping due to pain.  He is not feeling like himself, his energy level is not as good as usual He did take a couple of hydrocodone that will help; he takes these if he is really having a lot of pain.  He is not taking any NSAIDs or BC powders.  He stopped taking his mobic as well  No SOB.  He is still taking prilosec, zantac and carafate, but he is not sure if it is helping.  He does not notice any change in his sx before or after eating.  He does not have any nausea.  He does have burping and bloating. He may regurgitate some food and feels like food is coming back up after he eats.  He does not have any difficulty or pain with swallowing.  Weight is generally stable.    Wt Readings from Last 3 Encounters:  11/11/15 182 lb 6.4 oz (82.7 kg)  11/03/15 186 lb (84.4 kg)  10/30/15 186 lb 4 oz (84.5 kg)    Patient Active Problem List   Diagnosis Date Noted  . Chronic fatigue 10/09/2015  . Generalized anxiety disorder 10/09/2015  . Memory loss 10/09/2015  . Obstructive sleep apnea of adult 02/09/2015  . Benign prostatic hyperplasia with urinary obstruction  11/18/2013  . Acid reflux 11/18/2013  . Degenerative arthritis of lumbar spine 11/18/2013  . Headache, migraine 11/18/2013  . Obstructive apnea 11/18/2013  . External hemorrhoids with other complication [id number]  . Restless leg 05/27/2009    Past Medical History:  Diagnosis Date  . Anxiety   . Arthritis   . Basal cell carcinoma   . BPH (benign prostatic hyperplasia)   . Depression   . Dysphagia   . Fibromyalgia   . GERD (gastroesophageal reflux disease)    otc  tums  . Headache    migraines  . Herpes simplex type 1 infection   . History of migraine   . Hypertension    no longer on medication  . Insomnia   . Lower back pain   . Morton's neuroma   . OSA (obstructive sleep apnea)   . Primary snoring   . Recurrent canker sores   . Urinary frequency     Past Surgical History:  Procedure Laterality Date  . EPIDIDYMECTOMY     for spermatocele  . EYE SURGERY Bilateral    lasik eye surgery  . FOOT SURGERY Left   . INGUINAL HERNIA REPAIR Left    01/25/1999  . MOUTH SURGERY    . pancreatic surgery  r/t trauma    . SEPTOPLASTY N/A 02/09/2015   Procedure: SEPTOPLASTY;  Surgeon: Rozetta Nunnery, MD;  Location: [place];  Service: ENT;  Laterality: N/A;  . SKIN CANCER EXCISION    . TONSILLECTOMY    . TRANSURETHRAL INCISION OF PROSTATE    . TURBINATE REDUCTION Bilateral 02/09/2015   Procedure: BILATERAL TURBINATE REDUCTION;  Surgeon: Rozetta Nunnery, MD;  Location: [place];  Service: ENT;  Laterality: Bilateral;  . UVULOPALATOPHARYNGOPLASTY N/A 02/09/2015   Procedure: UVULOPALATOPHARYNGOPLASTY (UPPP);  Surgeon: Rozetta Nunnery, MD;  Location: [facility];  Service: ENT;  Laterality: N/A;    Social History  Substance Use Topics  . Smoking status: Former Smoker    Packs/day: 2.00    Years: 30.00    Types: Cigarettes    Quit date: 10/29/2005  . Smokeless tobacco: Never Used  . Alcohol use 0.0 oz/week     Comment: Rare    Family History  Problem Relation Age of  Onset  . Coronary artery disease Mother   . Stroke Mother   . Hypertension Father   . Anxiety disorder Brother   . Skin cancer Brother     No Known Allergies  Medication list has been reviewed and updated.  Current Outpatient Prescriptions on File Prior to Visit  Medication Sig Dispense Refill  . butalbital-acetaminophen-caffeine (FIORICET WITH CODEINE) 50-325-40-30 MG per capsule Take 1 capsule by mouth every 4 (four) hours as needed for headache.    . chlorhexidine (PERIDEX) 0.12 % solution 15 mLs 2 (two) times daily.  0  . Cholecalciferol (VITAMIN D3) 2000 UNITS TABS Take 2,000 Units by mouth daily.    . clonazePAM (KLONOPIN) 0.5 MG tablet Take 0.5 mg by mouth 2 (two) times daily.  2  . fesoterodine (TOVIAZ) 8 MG TB24 tablet Take 8 mg by mouth daily.    . fluocinonide ointment (LIDEX) AB-123456789 % Apply 1 application topically 4 (four) times daily.   0  . fluticasone (FLONASE) 50 MCG/ACT nasal spray Place 2 sprays into both nostrils daily. 43 g 3  . HYDROcodone-acetaminophen (HYCET) 7.5-325 mg/15 ml solution Take 10-15 mLs by mouth every 6 (six) hours as needed for moderate pain. 420 mL 0  . HYDROcodone-acetaminophen (NORCO/VICODIN) 5-325 MG tablet Take 1 tablet by mouth every 8 (eight) hours as needed for moderate pain. 20 tablet 0  . LIDOCAINE EX 1 application by Mouth Rinse route 4 (four) times daily as needed (for mouth sores).    Marland Kitchen LYRICA 100 MG capsule Take 2 capsules (200 mg total) by mouth at bedtime. 180 capsule 2  . Melatonin 5 MG TABS Take 5 mg by mouth at bedtime.    . meloxicam (MOBIC) 15 MG tablet Take 1 tablet (15 mg total) by mouth daily. 90 tablet 3  . methylphenidate (RITALIN) 20 MG tablet Take 1 tablet (20 mg total) by mouth 2 (two) times daily. Ok to fill in 30 days 60 tablet 0  . omeprazole (PRILOSEC) 40 MG capsule Take 1 capsule (40 mg total) by mouth daily. 90 capsule 3  . ranitidine (ZANTAC) 300 MG tablet Take 1 tablet (300 mg total) by mouth daily. 90 tablet 3  .  sertraline (ZOLOFT) 100 MG tablet Take 2 tablets (200 mg total) by mouth daily. 180 tablet 3  . sucralfate (CARAFATE) 1 GM/10ML suspension Take 10 mLs (1 g total) by mouth 4 (four) times daily -  with meals and at bedtime. 420 mL 0  . traZODone (DESYREL) 100 MG tablet Take one to  two tablets at night for sleep. (Patient taking differently: Take 50 mg by mouth at bedtime. Take one to two tablets at night for sleep.) 30 tablet 1  . vitamin B-12 (CYANOCOBALAMIN) 500 MCG tablet Take 500 mcg by mouth daily.     Current Facility-Administered Medications on File Prior to Visit  Medication Dose Route Frequency Provider Last Rate Last Dose  . 0.9 %  sodium chloride infusion  500 mL Intravenous Continuous Milus Banister, MD        Review of Systems: He will sometimes feel constipated, and other times will have a few BM in the same day. Sometimes the stools are hard/ difficult to pass and other times will be "almost shooting out of a gun"  As per HPI- otherwise negative  Physical Examination: Pulse Readings from Last 3 Encounters:  11/11/15 (!) 58  11/03/15 (!) 57  10/30/15 72   BP Readings from Last 3 Encounters:  11/11/15 106/72  11/03/15 (!) 98/58  10/30/15 114/72    Vitals:   11/11/15 0924  BP: 106/72  Pulse: (!) 58  Temp: 97.9 F (36.6 C)   Vitals:   11/11/15 0924  Weight: 182 lb 6.4 oz (82.7 kg)  Height: 6' (1.829 m)   Body mass index is 24.74 kg/m. Ideal Body Weight: Weight in (lb) to have BMI = 25: 183.9  GEN: WDWN, NAD, Non-toxic, A & O x 3, looks well, normal weight HEENT: Atraumatic, Normocephalic. Neck supple. No masses, No LAD.  Wears hearing aids. Normal thyroid exam Ears and Nose: No external deformity. CV: RRR, No M/G/R. No JVD. No thrill. No extra heart sounds. PULM: CTA B, no wheezes, crackles, rhonchi. No retractions. No resp. distress. No accessory muscle use. ABD: S,  ND, +BS. No rebound. No HSM. Epigastric tenderness only.  Negative murphy's sign.   EXTR:  No c/c/e NEURO Normal gait.  PSYCH: Normally interactive. Conversant. Not depressed or anxious appearing.  Calm demeanor.   Called and discussed with Dr. Ardis Hughs- recent UGI bx were negative for H pylori, he does have mild chronic appearing gastritis but this is probably not enough to explain his sx.  He would recommend that we move to a CT to look for any other cause of his pain Increase prilosec to BID in the meantime  Assessment and Plan: Abdominal pain, chronic, epigastric - Plan: CT ABDOMEN PELVIS W CONTRAST  Discussed with pt- he does not have h pylori so treating for his will not be helpful.  At this time we need to consider other causes of his pain besides gastritis/ other stomach pain and should get a CT.  He is willing to go ahead with this.  Recently had a CMP with normal renal function.  Ordered CT but then received word that it requires a peer to peer.  Will have to call and do peer to peer tomorrow  Signed Lamar Blinks, MD

## 2015-11-12 ENCOUNTER — Telehealth: Payer: Self-pay | Admitting: Family Medicine

## 2015-11-12 ENCOUNTER — Other Ambulatory Visit (INDEPENDENT_AMBULATORY_CARE_PROVIDER_SITE_OTHER): Payer: BLUE CROSS/BLUE SHIELD

## 2015-11-12 ENCOUNTER — Ambulatory Visit (HOSPITAL_BASED_OUTPATIENT_CLINIC_OR_DEPARTMENT_OTHER)
Admission: RE | Admit: 2015-11-12 | Discharge: 2015-11-12 | Disposition: A | Discharge status: Home / Self Care | Payer: BLUE CROSS/BLUE SHIELD | Source: Ambulatory Visit | Attending: Family Medicine | Admitting: Family Medicine

## 2015-11-12 ENCOUNTER — Encounter: Payer: Self-pay | Admitting: Family Medicine

## 2015-11-12 ENCOUNTER — Inpatient Hospital Stay (HOSPITAL_BASED_OUTPATIENT_CLINIC_OR_DEPARTMENT_OTHER): Admission: RE | Admit: 2015-11-12 | Payer: Self-pay | Source: Ambulatory Visit

## 2015-11-12 DIAGNOSIS — Z5181 Encounter for therapeutic drug level monitoring: Secondary | ICD-10-CM

## 2015-11-12 DIAGNOSIS — Z9289 Personal history of other medical treatment: Secondary | ICD-10-CM | POA: Diagnosis not present

## 2015-11-12 DIAGNOSIS — I700 Atherosclerosis of aorta: Secondary | ICD-10-CM | POA: Insufficient documentation

## 2015-11-12 DIAGNOSIS — G8929 Other chronic pain: Secondary | ICD-10-CM

## 2015-11-12 DIAGNOSIS — R1013 Epigastric pain: Secondary | ICD-10-CM | POA: Insufficient documentation

## 2015-11-12 LAB — BASIC METABOLIC PANEL
BUN: 14 mg/dL (ref 6–23)
CHLORIDE: 103 meq/L (ref 96–112)
CO2: 33 mEq/L — ABNORMAL HIGH (ref 19–32)
Calcium: 9.2 mg/dL (ref 8.4–10.5)
Creatinine, Ser: 1.1 mg/dL (ref 0.40–1.50)
GFR: 71.84 mL/min (ref >60.00)
Glucose, Bld: 85 mg/dL (ref 70–99)
POTASSIUM: 4.2 meq/L (ref 3.5–5.1)
SODIUM: 139 meq/L (ref 135–145)

## 2015-11-12 MED ORDER — IOPAMIDOL (ISOVUE-300) INJECTION 61%
100.0000 mL | Freq: Once | INTRAVENOUS | Status: AC | PRN
  Administered 2015-11-12: 100 mL via INTRAVENOUS

## 2015-11-12 NOTE — Telephone Encounter (Signed)
-----   Message from [place] sent at 11/12/2015 11:33 AM EDT ----- Regarding: CT Abdomen Hello Janett Billow,  The above patient would like to get their CT done today but also needs updated lab work. Could you please put in a STAT order for lab work so the patient may come in today to get everything done.   Thank you, Destiny B. [facility]

## 2015-11-12 NOTE — Telephone Encounter (Signed)
Did peer to peer- they will only approve CT abdomen, not pelvis Number [id number] Will re-order CT abdomen only and let referrals know

## 2015-11-13 ENCOUNTER — Encounter: Payer: Self-pay | Admitting: Family Medicine

## 2015-11-20 ENCOUNTER — Ambulatory Visit: Payer: Self-pay | Admitting: Gastroenterology

## 2015-11-20 ENCOUNTER — Telehealth: Payer: Self-pay | Admitting: Family Medicine

## 2015-11-20 ENCOUNTER — Encounter: Payer: Self-pay | Admitting: Family Medicine

## 2015-11-20 ENCOUNTER — Ambulatory Visit (INDEPENDENT_AMBULATORY_CARE_PROVIDER_SITE_OTHER): Payer: BLUE CROSS/BLUE SHIELD | Admitting: Family Medicine

## 2015-11-20 ENCOUNTER — Ambulatory Visit (HOSPITAL_BASED_OUTPATIENT_CLINIC_OR_DEPARTMENT_OTHER)
Admission: RE | Admit: 2015-11-20 | Discharge: 2015-11-20 | Disposition: A | Discharge status: Home / Self Care | Payer: BLUE CROSS/BLUE SHIELD | Source: Ambulatory Visit | Attending: Family Medicine | Admitting: Family Medicine

## 2015-11-20 VITALS — BP 112/76 | HR 64 | Temp 98.1°F | Wt 182.4 lb

## 2015-11-20 DIAGNOSIS — M7731 Calcaneal spur, right foot: Secondary | ICD-10-CM | POA: Insufficient documentation

## 2015-11-20 DIAGNOSIS — S99911A Unspecified injury of right ankle, initial encounter: Secondary | ICD-10-CM | POA: Insufficient documentation

## 2015-11-20 DIAGNOSIS — X58XXXA Exposure to other specified factors, initial encounter: Secondary | ICD-10-CM | POA: Diagnosis not present

## 2015-11-20 NOTE — Progress Notes (Signed)
Pre visit review using our clinic review tool, if applicable. No additional management support is needed unless otherwise documented below in the visit note. 

## 2015-11-20 NOTE — Progress Notes (Signed)
Musculoskeletal Exam  Patient: Patrick [name] DOB: [birth year]  DOS: 11/20/2015  SUBJECTIVE:  Chief Complaint:   Chief Complaint  Patient presents with  . Ankle Injury    Patrick [name] is a 65 y.o.  male for evaluation and treatment of R ankle injury.   Onset:  4 weeks ago.  Related to a twist. It is not improving so he is here to make sure he didn't fracture it. He has been tweaking it  Location: Medial ankle Character:  sharp  Progression of issue:  is unchanged Associated symptoms: Some difficulty walking Treatment: to date has been: none.   Neurovascular symptoms: no  ROS: Musculoskeletal/Extremities: +R ankle pain, no swelling Neurologic: no numbness, tingling no weakness   Past Medical History:  Diagnosis Date  . Anxiety   . Arthritis   . Basal cell carcinoma   . BPH (benign prostatic hyperplasia)   . Depression   . Dysphagia   . Fibromyalgia   . GERD (gastroesophageal reflux disease)    otc  tums  . Headache    migraines  . Herpes simplex type 1 infection   . History of migraine   . Hypertension    no longer on medication  . Insomnia   . Lower back pain   . Morton's neuroma   . OSA (obstructive sleep apnea)   . Primary snoring   . Recurrent canker sores   . Urinary frequency    Past Surgical History:  Procedure Laterality Date  . EPIDIDYMECTOMY     for spermatocele  . EYE SURGERY Bilateral    lasik eye surgery  . FOOT SURGERY Left   . INGUINAL HERNIA REPAIR Left    01/25/1999  . MOUTH SURGERY    . pancreatic surgery r/t trauma    . SEPTOPLASTY N/A 02/09/2015   Procedure: SEPTOPLASTY;  Surgeon: Rozetta Nunnery, MD;  Location: [facility];  Service: ENT;  Laterality: N/A;  . SKIN CANCER EXCISION    . TONSILLECTOMY    . TRANSURETHRAL INCISION OF PROSTATE    . TURBINATE REDUCTION Bilateral 02/09/2015   Procedure: BILATERAL TURBINATE REDUCTION;  Surgeon: Rozetta Nunnery, MD;  Location: [facility];  Service: ENT;  Laterality: Bilateral;   . UVULOPALATOPHARYNGOPLASTY N/A 02/09/2015   Procedure: UVULOPALATOPHARYNGOPLASTY (UPPP);  Surgeon: Rozetta Nunnery, MD;  Location: [facility] OR;  Service: ENT;  Laterality: N/A;   Family History  Problem Relation Age of Onset  . Coronary artery disease Mother   . Stroke Mother   . Hypertension Father   . Anxiety disorder Brother   . Skin cancer Brother    Current Outpatient Prescriptions  Medication Sig Dispense Refill  . butalbital-acetaminophen-caffeine (FIORICET WITH CODEINE) 50-325-40-30 MG per capsule Take 1 capsule by mouth every 4 (four) hours as needed for headache.    . chlorhexidine (PERIDEX) 0.12 % solution 15 mLs 2 (two) times daily.  0  . Cholecalciferol (VITAMIN D3) 2000 UNITS TABS Take 2,000 Units by mouth daily.    . clonazePAM (KLONOPIN) 0.5 MG tablet Take 0.5 mg by mouth 2 (two) times daily.  2  . fesoterodine (TOVIAZ) 8 MG TB24 tablet Take 8 mg by mouth daily.    . fluocinonide ointment (LIDEX) AB-123456789 % Apply 1 application topically 4 (four) times daily.   0  . fluticasone (FLONASE) 50 MCG/ACT nasal spray Place 2 sprays into both nostrils daily. 43 g 3  . HYDROcodone-acetaminophen (HYCET) 7.5-325 mg/15 ml solution Take 10-15 mLs by mouth every 6 (six) hours as needed  for moderate pain. 420 mL 0  . HYDROcodone-acetaminophen (NORCO/VICODIN) 5-325 MG tablet Take 1 tablet by mouth every 8 (eight) hours as needed for moderate pain. 20 tablet 0  . LIDOCAINE EX 1 application by Mouth Rinse route 4 (four) times daily as needed (for mouth sores).    Marland Kitchen LYRICA 100 MG capsule Take 2 capsules (200 mg total) by mouth at bedtime. 180 capsule 2  . Melatonin 5 MG TABS Take 5 mg by mouth at bedtime.    . meloxicam (MOBIC) 15 MG tablet Take 1 tablet (15 mg total) by mouth daily. 90 tablet 3  . methylphenidate (RITALIN) 20 MG tablet Take 1 tablet (20 mg total) by mouth 2 (two) times daily. Ok to fill in 30 days 60 tablet 0  . omeprazole (PRILOSEC) 40 MG capsule Take 1 capsule (40 mg total)  by mouth daily. 90 capsule 3  . ranitidine (ZANTAC) 300 MG tablet Take 1 tablet (300 mg total) by mouth daily. 90 tablet 3  . sertraline (ZOLOFT) 100 MG tablet Take 2 tablets (200 mg total) by mouth daily. 180 tablet 3  . sucralfate (CARAFATE) 1 GM/10ML suspension Take 10 mLs (1 g total) by mouth 4 (four) times daily -  with meals and at bedtime. 420 mL 0  . traZODone (DESYREL) 100 MG tablet Take one to two tablets at night for sleep. (Patient taking differently: Take 50 mg by mouth at bedtime. Take one to two tablets at night for sleep.) 30 tablet 1  . vitamin B-12 (CYANOCOBALAMIN) 500 MCG tablet Take 500 mcg by mouth daily.     Current Facility-Administered Medications  Medication Dose Route Frequency Provider Last Rate Last Dose  . 0.9 %  sodium chloride infusion  500 mL Intravenous Continuous Milus Banister, MD       No Known Allergies Social History   Social History  . Marital status: Married  . Number of children: 2  . Years of education: BS    Occupational History  . engineer    Social History Main Topics  . Smoking status: Former Smoker    Packs/day: 2.00    Years: 30.00    Types: Cigarettes    Quit date: 10/29/2005  . Smokeless tobacco: Never Used  . Alcohol use 0.0 oz/week     Comment: Rare  . Drug use: No   Social History Narrative   Denies caffeine use     Objective:  VITAL SIGNS: BP 112/76 (BP Location: Right Arm, Patient Position: Sitting, Cuff Size: Normal)   Pulse 64   Temp 98.1 F (36.7 C) (Oral)   Wt 182 lb 6.4 oz (82.7 kg)   SpO2 96%   BMI 24.74 kg/m  Constitutional: Well formed, well developed. Appears stated age. Cardiovascular: Brisk cap refill Thorax & Lungs:  Normal effort, no accessory muscle use Extremities: No clubbing. No cyanosis. No edema.  Skin: Warm. Dry. No erythema. No rash.  Musculoskeletal: R ankle.   Normal active range of motion: yes.   Normal passive range of motion: yes Tenderness to palpation: yes Deformity:  no Swelling: no Ecchymosis: no Tests positive: None Tests negative: Anterior drawer, squeeze Neurologic: Sensation intact to light touch on R foot. Psychiatric: Normal mood and affect. Age appropriate judgment and insight.    Assessment:  Right ankle injury, initial encounter - Plan: DG Ankle Complete Right  Plan: Orders as above. XR to rule out fracture. My read is no acute fractures or dislocations. Await official read. Gave stretches and exercises with  guidance on ice and heat.  F/u in 1 week if symptoms fail to improve. Would consider PT and very possibly MRI. The patient voiced understanding and agreement to the plan.   [place], DO 11/20/15  4:55 PM

## 2015-11-20 NOTE — Telephone Encounter (Signed)
Pt has appt with Dr Nani Ravens today at [place].

## 2015-11-20 NOTE — Telephone Encounter (Signed)
Pt called in to request order to be placed for an X-Ray. He says that he twisted his foot about a month ago and is still having pain.    Please advise.    CB: [phone]

## 2015-11-20 NOTE — Patient Instructions (Addendum)
HEAT AND COLD  Cold treatment (icing) is used to relieve pain and reduce inflammation for acute and chronic cases. Cold should be applied for 10 to 15 minutes every 2 to 3 hours for inflammation and pain and immediately after any activity that aggravates your symptoms. Use ice packs or an ice massage.  Heat treatment may be used before performing stretching and strengthening activities prescribed by your caregiver. Use a heat pack or a warm soak. SEEK IMMEDIATE MEDICAL CARE IF:   Pain, swelling, or bruising worsens despite treatment.  You experience pain, numbness, discoloration, or coldness in the foot or toes.  New, unexplained symptoms develop (drugs used in treatment may produce side effects.)   EXERCISES   These exercises may help you when beginning to restore flexibility in your ankle. You will likely work on these exercises for the 1 to 2 weeks after your injury. Once your physician, physical therapist, or athletic trainer sees adequate progress, he or she will advance your exercises. While completing these exercises, remember:   Restoring tissue flexibility helps normal motion to return to the joints. This allows healthier, less painful movement and activity.  An effective stretch should be held for at least 30 seconds.  A stretch should never be painful. You should only feel a gentle lengthening or release in the stretched tissue.  RANGE OF MOTION - Dorsi/Plantar Flexion  While sitting with your right / left knee straight, draw the top of your foot upwards by flexing your ankle. Then reverse the motion, pointing your toes downward.  Hold each position for __________ seconds.  After completing your first set of exercises, repeat this exercise with your knee bent.   RANGE OF MOTION - Ankle Alphabet  Imagine your right / left big toe is a pen.  Keeping your hip and knee still, write out the entire alphabet with your "pen." Make the letters as large as you can without  increasing any discomfort.   RANGE OF MOTION - Ankle Plantar Flexion   Sit with your right / left leg crossed over your opposite knee.  Use your opposite hand to pull the top of your foot and toes toward you.  You should feel a gentle stretch on the top of your foot/ankle. Hold this position for __________. Repeat __________ times. Complete __________ times per day.   RANGE OF MOTION - Ankle Eversion  Sit with your right / left ankle crossed over your opposite knee.  Grip your foot with your opposite hand, placing your thumb on the top of your foot and your fingers across the bottom of your foot.  Gently push your foot downward with a slight rotation so your littlest toes rise slightly  You should feel a gentle stretch on the inside of your ankle. Hold the stretch for __________ seconds. Repeat __________ times. Complete this exercise __________ times per day.   RANGE OF MOTION - Ankle Inversion  Sit with your right / left ankle crossed over your opposite knee.  Grip your foot with your opposite hand, placing your thumb on the bottom of your foot and your fingers across the top of your foot.  Gently pull your foot so the smallest toe comes toward you and your thumb pushes the inside of the ball of your foot away from you.  You should feel a gentle stretch on the outside of your ankle. Hold the stretch for __________ seconds. Repeat __________ times. Complete this exercise __________ times per day.   STRETCH - Gastrocsoleus  Sit  with your right / left leg extended. Holding onto both ends of a belt or towel, loop it around the ball of your foot.  Keeping your right / left ankle and foot relaxed and your knee straight, pull your foot and ankle toward you using the belt/towel.  You should feel a gentle stretch behind your calf or knee. Hold this position for __________ seconds. Repeat __________ times. Complete this stretch __________ times per day.   RANGE OF MOTION - Ankle  Dorsiflexion, Active Assisted  Remove shoes and sit on a chair that is preferably not on a carpeted surface.  Place right / left foot under knee. Extend your opposite leg for support.  Keeping your heel down, slide your right / left foot back toward the chair until you feel a stretch at your ankle or calf. If you do not feel a stretch, slide your bottom forward to the edge of the chair while still keeping your heel down.  Hold this stretch for __________ seconds. Repeat __________ times. Complete this stretch __________ times per day.   STRETCH - Gastroc, Standing   Place hands on wall.  Extend right / left leg and place a folded washcloth under the arch of your foot for support. Keep the front knee somewhat bent.  Slightly point your toes inward on your back foot.  Keeping your right / left heel on the floor and your knee straight, shift your weight toward the wall, not allowing your back to arch.  You should feel a gentle stretch in the calf. Hold this position for __________ seconds.  STRETCH - Soleus, Standing  Place hands on wall.  Extend right / left leg and place a folded washcloth under the arch of your foot for support. Keep the front knee somewhat bent.  Slightly point your toes inward on your back foot.  Keep your right / left heel on the floor, bend your back knee, and slightly shift your weight over the back leg so that you feel a gentle stretch deep in your back calf.  Hold this position for __________ seconds.  STRETCH - Gastrocsoleus, Standing Note: This exercise can place a lot of stress on your foot and ankle. Please complete this exercise only if specifically instructed by your caregiver.   Place the ball of your right / left foot on a step, keeping your other foot firmly on the same step.  Hold on to the wall or a rail for balance.  Slowly lift your other foot, allowing your body weight to press your heel down over the edge of the step.  You should feel  a stretch in your right / left calf.  Hold this position for __________ seconds.  Repeat this exercise with a slight bend in your knee.     Strong muscles with good endurance tolerate stress better.  Do the exercises as initially prescribed by your caregiver. Progress slowly with each exercise, gradually increasing the number of repetitions and weight used under his or her guidance.  You may experience muscle soreness or fatigue, but the pain or discomfort you are trying to eliminate should never worsen during these exercises. If this pain does worsen, stop and make certain you are following the directions exactly. If the pain is still present after adjustments, discontinue the exercise until you can discuss the trouble with your caregiver.  STRENGTH - Plantar-flexors, Standing  Stand with your feet shoulder width apart. Steady yourself with a wall or table using as little support as needed.  Keeping your weight evenly spread over the width of your feet, rise up on your toes.*  Hold this position for __________ seconds. *If this is too easy, shift your weight toward your right / left leg until you feel challenged. Ultimately, you may be asked to do this exercise with your right / left foot only.  STRENGTH - Dorsiflexors and Plantar-flexors, Heel/toe Walking  Dorsiflexion: Walk on your heels only. Keep your toes as high as possible.  Walk for ____________________ seconds/feet.  Repeat __________ times. Complete __________ times per day.  Plantar flexion: Walk on your toes only. Keep your heels as high as possible.  Walk for ____________________ seconds/feet.  BALANCE - Tandem Walking  Place your uninjured foot on a line 2 to 4 inches wide and at least 10 feet long.  Keeping your balance without using anything for extra support, place your right / left heel directly in front of your other foot.  Slowly raise your back foot up, lifting from the heel to the toes, and place it  directly in front of the right / left foot.  Continue to walk along the line slowly. Walk for ____________________ feet.    STRENGTH - Plantar-flexors, Eccentric Note: This exercise can place a lot of stress on your foot and ankle. Please complete this exercise only if specifically instructed by your caregiver.   Place the balls of your feet on a step. With your hands, use only enough support from a wall or rail to keep your balance.  Keep your knees straight and rise up on your toes.  Slowly shift your weight entirely to your toes and pick up your opposite foot. Gently and with controlled movement, lower your weight through your right / left foot so that your heel drops below the level of the step. You will feel a slight stretch in the back of your calf at the ending position.  Use the healthy leg to help rise up onto the balls of both feet, then lower weight only on the right / left leg again. Build up to 15 repetitions. Then progress to 3 consecutive sets of 15 repetitions.*  After completing the above exercise, complete the same exercise with a slight knee bend (about 30 degrees). Again, build up to 15 repetitions. Then progress to 3 consecutive sets of 15 repetitions.*

## 2015-12-01 ENCOUNTER — Encounter: Payer: Self-pay | Admitting: Family Medicine

## 2015-12-02 ENCOUNTER — Ambulatory Visit: Payer: Self-pay | Admitting: Internal Medicine

## 2015-12-03 MED ORDER — TEMAZEPAM 7.5 MG PO CAPS: 7.5000 mg | ORAL_CAPSULE | Freq: Every evening | ORAL | 1 refills | Status: DC | PRN

## 2015-12-03 NOTE — Addendum Note (Signed)
Addended by: Lamar Blinks C on: 12/03/2015 10:24 PM   Modules accepted: Orders

## 2015-12-17 ENCOUNTER — Encounter: Payer: Self-pay | Admitting: Family Medicine

## 2015-12-21 ENCOUNTER — Ambulatory Visit: Payer: Self-pay | Admitting: Gastroenterology

## 2016-01-04 ENCOUNTER — Other Ambulatory Visit: Payer: Self-pay | Admitting: Family Medicine

## 2016-01-11 ENCOUNTER — Ambulatory Visit: Payer: Self-pay | Admitting: Family Medicine

## 2016-01-15 ENCOUNTER — Encounter: Payer: Self-pay | Admitting: Family Medicine

## 2016-01-15 DIAGNOSIS — F411 Generalized anxiety disorder: Secondary | ICD-10-CM

## 2016-01-15 MED ORDER — SERTRALINE HCL 100 MG PO TABS: 200.0000 mg | ORAL_TABLET | Freq: Every day | ORAL | 3 refills | Status: DC

## 2016-01-15 MED ORDER — TRAZODONE HCL 100 MG PO TABS: ORAL_TABLET | ORAL | 3 refills | Status: DC

## 2016-01-17 MED ORDER — CLONAZEPAM 0.5 MG PO TABS: ORAL_TABLET | ORAL | 2 refills | Status: DC

## 2016-01-17 NOTE — Addendum Note (Signed)
Addended by: Lamar Blinks C on: 01/17/2016 05:40 PM   Modules accepted: Orders

## 2016-01-18 ENCOUNTER — Encounter: Payer: Self-pay | Admitting: Family Medicine

## 2016-01-18 ENCOUNTER — Ambulatory Visit (INDEPENDENT_AMBULATORY_CARE_PROVIDER_SITE_OTHER): Payer: BLUE CROSS/BLUE SHIELD | Admitting: Family Medicine

## 2016-01-18 VITALS — BP 114/84 | HR 59 | Temp 98.2°F | Wt 183.0 lb

## 2016-01-18 DIAGNOSIS — M545 Low back pain: Secondary | ICD-10-CM

## 2016-01-18 DIAGNOSIS — G8929 Other chronic pain: Secondary | ICD-10-CM

## 2016-01-18 DIAGNOSIS — R251 Tremor, unspecified: Secondary | ICD-10-CM | POA: Diagnosis not present

## 2016-01-18 LAB — IBC PANEL
Iron: 179 ug/dL — ABNORMAL HIGH (ref 42–165)
Saturation Ratios: 47.7 % (ref 20.0–50.0)
Transferrin: 268 mg/dL (ref 212.0–360.0)

## 2016-01-18 LAB — TSH: TSH: 0.78 u[IU]/mL (ref 0.35–4.50)

## 2016-01-18 LAB — FOLATE: Folate: 12.4 ng/mL (ref >5.9)

## 2016-01-18 LAB — VITAMIN B12: VITAMIN B 12: 416 pg/mL (ref 211–911)

## 2016-01-18 MED ORDER — HYDROCODONE-ACETAMINOPHEN 5-325 MG PO TABS: 1.0000 | ORAL_TABLET | Freq: Three times a day (TID) | ORAL | 0 refills | Status: DC | PRN

## 2016-01-18 MED ORDER — PROPRANOLOL HCL 10 MG PO TABS: ORAL_TABLET | ORAL | 1 refills | Status: DC

## 2016-01-18 NOTE — Progress Notes (Signed)
Pre visit review using our clinic review tool, if applicable. No additional management support is needed unless otherwise documented below in the visit note. 

## 2016-01-18 NOTE — Patient Instructions (Signed)
It was good to see you today- we are going to look for any cause of your tremor.  Assuming your labs look ok, your tremor is likely a "benign essential tremor," but I do want you to alert me if any changes or worsening We will have you try propranolol as needed for tremor- start with 10 mg.   This will probably help to control shaking of your hands and voice prior to public speaking.  We can increase this dose if need be

## 2016-01-18 NOTE — Progress Notes (Signed)
[place] at [facility] [address], [place], [place] [address] [phone]  Date:  01/18/2016   Name:  Patrick [name]   DOB:  [birth year]   MRN:  [id number]  PCP:  Lamar Blinks, MD    Chief Complaint: Tremors (c/o tremors, pt states that he noticed it for a year or more. tremors present in both hands noticed more after lifting or if nervous. )   History of Present Illness:  Patrick [name] is a 65 y.o. very pleasant male patient who presents with the following:  He has noted some tremors- trembling of his hands.  This has been present for a year or a little more.   He is not aware of any family history of tremors.   He notes that this is much worse if he is nervous. If he has to speak in public- like at work-  this will be more pronounced. "if I have to hold a microphone the whole thing will shake No difficulty in his speaking, chewing, no gait changes His does feel like his hands are weaker than they used to be in the past  He does have some back stiffness as well but this is long-standing He has been on ritalin for 12- 18 months.   He did decrease his zoloft to 100 mg from 200 mg to see if this would help- it did not seem to mae a difference   He uses the ritalin as needed for fatigue after lunch.  He does not take it every day; however on days that he does not take it he does not have any improvement.  He has decreased his lyrica from 300 to 200 also  He is staying away from mobic due to his stomach issues. He will use hydrocodone when needed for more severe back pain He has not noted any easy fatigability to his muscles or proximal muscle weakness    Patient Active Problem List   Diagnosis Date Noted  . Chronic fatigue 10/09/2015  . Generalized anxiety disorder 10/09/2015  . Memory loss 10/09/2015  . Obstructive sleep apnea of adult 02/09/2015  . Benign prostatic hyperplasia with urinary obstruction  11/18/2013  . Acid reflux 11/18/2013  . Degenerative arthritis of lumbar spine 11/18/2013  . Headache, migraine 11/18/2013  . Obstructive apnea 11/18/2013  . External hemorrhoids with other complication [id number]  . Restless leg 05/27/2009    Past Medical History:  Diagnosis Date  . Anxiety   . Arthritis   . Basal cell carcinoma   . BPH (benign prostatic hyperplasia)   . Depression   . Dysphagia   . Fibromyalgia   . GERD (gastroesophageal reflux disease)    otc  tums  . Headache    migraines  . Herpes simplex type 1 infection   . History of migraine   . Hypertension    no longer on medication  . Insomnia   . Lower back pain   . Morton's neuroma   . OSA (obstructive sleep apnea)   . Primary snoring   . Recurrent canker sores   . Urinary frequency     Past Surgical History:  Procedure Laterality Date  . EPIDIDYMECTOMY     for spermatocele  . EYE SURGERY Bilateral    lasik eye surgery  . FOOT SURGERY Left   . INGUINAL HERNIA REPAIR Left    01/25/1999  . MOUTH SURGERY    . pancreatic surgery r/t trauma    .  SEPTOPLASTY N/A 02/09/2015   Procedure: SEPTOPLASTY;  Surgeon: Rozetta Nunnery, MD;  Location: [facility];  Service: ENT;  Laterality: N/A;  . SKIN CANCER EXCISION    . TONSILLECTOMY    . TRANSURETHRAL INCISION OF PROSTATE    . TURBINATE REDUCTION Bilateral 02/09/2015   Procedure: BILATERAL TURBINATE REDUCTION;  Surgeon: Rozetta Nunnery, MD;  Location: [facility];  Service: ENT;  Laterality: Bilateral;  . UVULOPALATOPHARYNGOPLASTY N/A 02/09/2015   Procedure: UVULOPALATOPHARYNGOPLASTY (UPPP);  Surgeon: Rozetta Nunnery, MD;  Location: [facility];  Service: ENT;  Laterality: N/A;    Social History  Substance Use Topics  . Smoking status: Former Smoker    Packs/day: 2.00    Years: 30.00    Types: Cigarettes    Quit date: 10/29/2005  . Smokeless tobacco: Never Used  . Alcohol use 0.0 oz/week     Comment: Rare    Family History  Problem Relation Age of  Onset  . Coronary artery disease Mother   . Stroke Mother   . Hypertension Father   . Anxiety disorder Brother   . Skin cancer Brother     No Known Allergies  Medication list has been reviewed and updated.  Current Outpatient Prescriptions on File Prior to Visit  Medication Sig Dispense Refill  . chlorhexidine (PERIDEX) 0.12 % solution 15 mLs 2 (two) times daily.  0  . Cholecalciferol (VITAMIN D3) 2000 UNITS TABS Take 2,000 Units by mouth daily.    . clonazePAM (KLONOPIN) 0.5 MG tablet Take 1 or 2 at bedtime as needed for sleep 60 tablet 2  . fesoterodine (TOVIAZ) 8 MG TB24 tablet Take 8 mg by mouth daily.    . fluocinonide ointment (LIDEX) [id number] % Apply 1 application topically 4 (four) times daily.   0  . fluticasone (FLONASE) 50 MCG/ACT nasal spray Place 2 sprays into both nostrils daily. 43 g 3  . HYDROcodone-acetaminophen (NORCO/VICODIN) 5-325 MG tablet Take 1 tablet by mouth every 8 (eight) hours as needed for moderate pain. 20 tablet 0  . LIDOCAINE EX 1 application by Mouth Rinse route 4 (four) times daily as needed (for mouth sores).    Marland Kitchen LYRICA 100 MG capsule Take 2 capsules (200 mg total) by mouth at bedtime. 180 capsule 2  . Melatonin 5 MG TABS Take 5 mg by mouth at bedtime.    . meloxicam (MOBIC) 15 MG tablet Take 1 tablet (15 mg total) by mouth daily. 90 tablet 3  . methylphenidate (RITALIN) 20 MG tablet Take 1 tablet (20 mg total) by mouth 2 (two) times daily. Ok to fill in 30 days 60 tablet 0  . omeprazole (PRILOSEC) 40 MG capsule Take 1 capsule (40 mg total) by mouth daily. 90 capsule 3  . ranitidine (ZANTAC) 300 MG tablet Take 1 tablet (300 mg total) by mouth daily. 90 tablet 3  . sertraline (ZOLOFT) 100 MG tablet Take 2 tablets (200 mg total) by mouth daily. 180 tablet 3  . sucralfate (CARAFATE) 1 GM/10ML suspension Take 10 mLs (1 g total) by mouth 4 (four) times daily -  with meals and at bedtime. 420 mL 0  . traZODone (DESYREL) 100 MG tablet Take 1/2 or 1 tablet (50 or  100 mg) at bedtime as needed for sleep 90 tablet 3  . vitamin B-12 (CYANOCOBALAMIN) 500 MCG tablet Take 500 mcg by mouth daily.     No current facility-administered medications on file prior to visit.     Review of Systems:  As per HPI- otherwise  negative.  Denies any significant alcohol intake   Physical exam Ideal Body Weight:    GEN: WDWN, NAD, Non-toxic, A & O x 3, looks well HEENT: Atraumatic, Normocephalic. Neck supple. No masses, No LAD.  Bilateral hearing aids. oropharynx normal.  PEERL,EOMI.   Ears and Nose: No external deformity. CV: RRR, No M/G/R. No JVD. No thrill. No extra heart sounds. PULM: CTA B, no wheezes, crackles, rhonchi. No retractions. No resp. distress. No accessory muscle use. ABD: S, NT, ND EXTR: No c/c/e NEURO Normal gait.   Normal strength, sensation and DTR of all extremities.  He has a fine, rapid tremor of both hands when outstretched.  Negative romberg PSYCH: Normally interactive. Conversant. Not depressed or anxious appearing.  Calm demeanor.    Assessment and Plan: Tremor of both hands - Plan: TSH, Ceruloplasmin, Folate, Vitamin B12, IBC panel, propranolol (INDERAL) 10 MG tablet  Chronic low back pain, unspecified back pain laterality, with sciatica presence unspecified - Plan: HYDROcodone-acetaminophen (NORCO/VICODIN) 5-325 MG tablet   Here today to discus a tremor in both hands that is worse with fatigue or stress/ nervousness Labs pending as above to look for any organic cause Will try inderal as needed prior to public speaking, etc Refilled his vicodin today to use prn for back pain  Meds ordered this encounter  Medications  . HYDROcodone-acetaminophen (NORCO/VICODIN) 5-325 MG tablet    Sig: Take 1 tablet by mouth every 8 (eight) hours as needed for moderate pain.    Dispense:  30 tablet    Refill:  0  . propranolol (INDERAL) 10 MG tablet    Sig: Take every 12 hours as needed for tremor    Dispense:  30 tablet    Refill:  1      Signed Lamar Blinks, MD

## 2016-01-20 LAB — CERULOPLASMIN: CERULOPLASMIN: 21 mg/dL (ref 18–36)

## 2016-01-22 ENCOUNTER — Encounter: Payer: Self-pay | Admitting: Family Medicine

## 2016-01-25 ENCOUNTER — Ambulatory Visit: Payer: Self-pay | Admitting: Family Medicine

## 2016-01-26 ENCOUNTER — Encounter: Payer: Self-pay | Admitting: Family Medicine

## 2016-01-27 DIAGNOSIS — H903 Sensorineural hearing loss, bilateral: Secondary | ICD-10-CM | POA: Diagnosis not present

## 2016-02-17 ENCOUNTER — Other Ambulatory Visit: Payer: Self-pay | Admitting: Family Medicine

## 2016-02-17 DIAGNOSIS — R5382 Chronic fatigue, unspecified: Secondary | ICD-10-CM

## 2016-02-17 DIAGNOSIS — G8929 Other chronic pain: Secondary | ICD-10-CM

## 2016-02-17 DIAGNOSIS — M545 Low back pain: Secondary | ICD-10-CM

## 2016-02-18 ENCOUNTER — Encounter: Payer: Self-pay | Admitting: Family Medicine

## 2016-02-18 MED ORDER — HYDROCODONE-ACETAMINOPHEN 5-325 MG PO TABS: 1.0000 | ORAL_TABLET | Freq: Three times a day (TID) | ORAL | 0 refills | Status: DC | PRN

## 2016-02-18 MED ORDER — METHYLPHENIDATE HCL 20 MG PO TABS: 20.0000 mg | ORAL_TABLET | Freq: Two times a day (BID) | ORAL | 0 refills | Status: DC

## 2016-02-18 NOTE — Telephone Encounter (Signed)
Last visit one month ago Reviewed [place]- nothing unexpected.  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

## 2016-02-23 ENCOUNTER — Encounter: Payer: Self-pay | Admitting: Family Medicine

## 2016-02-23 DIAGNOSIS — Z79891 Long term (current) use of opiate analgesic: Secondary | ICD-10-CM | POA: Diagnosis not present

## 2016-02-23 DIAGNOSIS — Z79899 Other long term (current) drug therapy: Secondary | ICD-10-CM | POA: Diagnosis not present

## 2016-02-24 ENCOUNTER — Encounter: Payer: Self-pay | Admitting: Family Medicine

## 2016-02-24 DIAGNOSIS — G894 Chronic pain syndrome: Secondary | ICD-10-CM

## 2016-02-24 DIAGNOSIS — K219 Gastro-esophageal reflux disease without esophagitis: Secondary | ICD-10-CM

## 2016-02-24 DIAGNOSIS — F411 Generalized anxiety disorder: Secondary | ICD-10-CM

## 2016-02-24 DIAGNOSIS — R251 Tremor, unspecified: Secondary | ICD-10-CM

## 2016-02-24 MED ORDER — RANITIDINE HCL 300 MG PO TABS: 300.0000 mg | ORAL_TABLET | Freq: Every day | ORAL | 3 refills | Status: DC

## 2016-02-24 MED ORDER — OMEPRAZOLE 40 MG PO CPDR: 40.0000 mg | DELAYED_RELEASE_CAPSULE | Freq: Every day | ORAL | 3 refills | Status: DC

## 2016-02-24 MED ORDER — PROPRANOLOL HCL 10 MG PO TABS: ORAL_TABLET | ORAL | 3 refills | Status: DC

## 2016-02-24 MED ORDER — SERTRALINE HCL 100 MG PO TABS: 200.0000 mg | ORAL_TABLET | Freq: Every day | ORAL | 3 refills | Status: DC

## 2016-02-24 MED ORDER — LYRICA 100 MG PO CAPS: 200.0000 mg | ORAL_CAPSULE | Freq: Every day | ORAL | 1 refills | Status: DC

## 2016-02-24 MED ORDER — TRAZODONE HCL 100 MG PO TABS: ORAL_TABLET | ORAL | 3 refills | Status: DC

## 2016-03-13 ENCOUNTER — Encounter: Payer: Self-pay | Admitting: Family Medicine

## 2016-03-23 ENCOUNTER — Ambulatory Visit (INDEPENDENT_AMBULATORY_CARE_PROVIDER_SITE_OTHER): Payer: BLUE CROSS/BLUE SHIELD | Admitting: Physician Assistant

## 2016-03-23 ENCOUNTER — Encounter: Payer: Self-pay | Admitting: Physician Assistant

## 2016-03-23 VITALS — BP 132/87 | HR 65 | Temp 98.2°F | Resp 16 | Ht 72.0 in | Wt 184.6 lb

## 2016-03-23 DIAGNOSIS — J0100 Acute maxillary sinusitis, unspecified: Secondary | ICD-10-CM

## 2016-03-23 DIAGNOSIS — J069 Acute upper respiratory infection, unspecified: Secondary | ICD-10-CM

## 2016-03-23 DIAGNOSIS — B001 Herpesviral vesicular dermatitis: Secondary | ICD-10-CM | POA: Diagnosis not present

## 2016-03-23 MED ORDER — BENZONATATE 100 MG PO CAPS: 100.0000 mg | ORAL_CAPSULE | Freq: Two times a day (BID) | ORAL | 0 refills | Status: DC | PRN

## 2016-03-23 MED ORDER — VALACYCLOVIR HCL 1 G PO TABS: 1000.0000 mg | ORAL_TABLET | Freq: Two times a day (BID) | ORAL | 0 refills | Status: DC

## 2016-03-23 MED ORDER — DOXYCYCLINE HYCLATE 100 MG PO TABS: 100.0000 mg | ORAL_TABLET | Freq: Two times a day (BID) | ORAL | 0 refills | Status: DC

## 2016-03-23 NOTE — Patient Instructions (Signed)
It was great meeting you today.  For the Valtrex, take two tablets when you get the medication. Then twelve hours later take the other two tablets.  Begin the doxycycline for your upper respiratory and sinus infection today. Take with food to prevent stomach upset.  Use tessalon as needed for cough.  Let us know if your symptoms do not improve.

## 2016-03-23 NOTE — Progress Notes (Signed)
Pre visit review using our clinic review tool, if applicable. No additional management support is needed unless otherwise documented below in the visit note. 

## 2016-03-23 NOTE — Progress Notes (Signed)
Subjective:    Patient ID: Patrick [name], male    DOB: 1950/06/05, 66 y.o.   MRN: [id number]  HPI   Patrick [name] is a 66 y/o male who presents with chief complaint of "upper respiratory infection." He endorses cough, chest congestion, sinus pressure, headache,  SOB. His symptoms started on Friday of last week. He felt the worst over the weekend, endorses body aches and possible low-grade fevers. He endorses tooth pain that he relates to his sinus pressure. He went for a walk with his son yesterday and he found himself getting a bit SOB when he attempted to walk briskly. He endorses multiple sick contacts at work.   He has tried alka seltzer cold and flu for his symptoms without relief.  He also reports that he developed a cold sore over the weekend. It is on the corner of his upper and lower R-side of his lip. He has been using OTC releev cream for this.  Review of Systems  See HPI  Past Medical History:  Diagnosis Date  . Anxiety   . Arthritis   . Basal cell carcinoma   . BPH (benign prostatic hyperplasia)   . Depression   . Dysphagia   . Fibromyalgia   . GERD (gastroesophageal reflux disease)    otc  tums  . Headache    migraines  . Herpes simplex type 1 infection   . History of migraine   . Hypertension    no longer on medication  . Insomnia   . Lower back pain   . Morton's neuroma   . OSA (obstructive sleep apnea)   . Primary snoring   . Recurrent canker sores   . Urinary frequency      Social History   Social History  . Marital status: Married    Spouse name: N/A  . Number of children: 2  . Years of education: BS    Occupational History  . engineer    Social History Main Topics  . Smoking status: Former Smoker    Packs/day: 2.00    Years: 30.00    Types: Cigarettes    Quit date: 10/29/2005  . Smokeless tobacco: Never Used  . Alcohol use 0.0 oz/week     Comment: Rare  . Drug use: No  . Sexual activity: Not on file   Other Topics Concern    . Not on file   Social History Narrative   Denies caffeine use     Past Surgical History:  Procedure Laterality Date  . EPIDIDYMECTOMY     for spermatocele  . EYE SURGERY Bilateral    lasik eye surgery  . FOOT SURGERY Left   . INGUINAL HERNIA REPAIR Left    01/25/1999  . MOUTH SURGERY    . pancreatic surgery r/t trauma    . SEPTOPLASTY N/A 02/09/2015   Procedure: SEPTOPLASTY;  Surgeon: Rozetta Nunnery, MD;  Location: [facility];  Service: ENT;  Laterality: N/A;  . SKIN CANCER EXCISION    . TONSILLECTOMY    . TRANSURETHRAL INCISION OF PROSTATE    . TURBINATE REDUCTION Bilateral 02/09/2015   Procedure: BILATERAL TURBINATE REDUCTION;  Surgeon: Rozetta Nunnery, MD;  Location: [facility];  Service: ENT;  Laterality: Bilateral;  . UVULOPALATOPHARYNGOPLASTY N/A 02/09/2015   Procedure: UVULOPALATOPHARYNGOPLASTY (UPPP);  Surgeon: Rozetta Nunnery, MD;  Location[facility];  Service: ENT;  Laterality: N/A;    Family History  Problem Relation Age of Onset  . Coronary artery disease Mother   .  Stroke Mother   . Hypertension Father   . Anxiety disorder Brother   . Skin cancer Brother     No Known Allergies  Current Outpatient Prescriptions on File Prior to Visit  Medication Sig Dispense Refill  . Cholecalciferol (VITAMIN D3) 2000 UNITS TABS Take 2,000 Units by mouth daily.    . clonazePAM (KLONOPIN) 0.5 MG tablet Take 1 or 2 at bedtime as needed for sleep 60 tablet 2  . fesoterodine (TOVIAZ) 8 MG TB24 tablet Take 8 mg by mouth daily.    . fluocinonide ointment (LIDEX) [id number] % Apply 1 application topically 4 (four) times daily.   0  . fluticasone (FLONASE) 50 MCG/ACT nasal spray Place 2 sprays into both nostrils daily. 43 g 3  . HYDROcodone-acetaminophen (NORCO/VICODIN) 5-325 MG tablet Take 1 tablet by mouth every 8 (eight) hours as needed for moderate pain. 30 tablet 0  . LYRICA 100 MG capsule Take 2 capsules (200 mg total) by mouth at bedtime. 180 capsule 1  . Melatonin 5 MG TABS  Take 5 mg by mouth at bedtime.    . meloxicam (MOBIC) 15 MG tablet Take 1 tablet (15 mg total) by mouth daily. 90 tablet 3  . methylphenidate (RITALIN) 20 MG tablet Take 1 tablet (20 mg total) by mouth 2 (two) times daily. 60 tablet 0  . methylphenidate (RITALIN) 20 MG tablet Take 1 tablet (20 mg total) by mouth 2 (two) times daily. Ok to fill in 30 days 60 tablet 0  . omeprazole (PRILOSEC) 40 MG capsule Take 1 capsule (40 mg total) by mouth daily. 90 capsule 3  . propranolol (INDERAL) 10 MG tablet Take every 12 hours as needed for tremor 180 tablet 3  . ranitidine (ZANTAC) 300 MG tablet Take 1 tablet (300 mg total) by mouth daily. 90 tablet 3  . sertraline (ZOLOFT) 100 MG tablet Take 2 tablets (200 mg total) by mouth daily. 180 tablet 3  . sucralfate (CARAFATE) 1 GM/10ML suspension Take 10 mLs (1 g total) by mouth 4 (four) times daily -  with meals and at bedtime. 420 mL 0  . traZODone (DESYREL) 100 MG tablet Take 1/2 or 1 tablet (50 or 100 mg) at bedtime as needed for sleep 90 tablet 3  . vitamin B-12 (CYANOCOBALAMIN) 500 MCG tablet Take 500 mcg by mouth daily.     No current facility-administered medications on file prior to visit.     BP 132/87 (BP Location: Right Arm, Cuff Size: Normal)   Pulse 65   Temp 98.2 F (36.8 C) (Oral)   Resp 16   Ht 6' (1.829 m)   Wt 184 lb 9.6 oz (83.7 kg)   SpO2 97%   BMI 25.04 kg/m       Objective:   Physical Exam  Constitutional: Vital signs are normal. He appears well-developed and well-nourished.  HENT:  Head: Normocephalic and atraumatic.  Right Ear: Tympanic membrane, external ear and ear canal normal. Tympanic membrane is not erythematous, not retracted and not bulging.  Left Ear: Tympanic membrane, external ear and ear canal normal. Tympanic membrane is not erythematous, not retracted and not bulging.  Nose: Right sinus exhibits maxillary sinus tenderness. Left sinus exhibits maxillary sinus tenderness.  Mouth/Throat: Uvula is midline.  Posterior oropharyngeal edema and posterior oropharyngeal erythema present. No oropharyngeal exudate.  Lesion noted to upper and lower lip on lateral right corner, no active discharge, some dark red crusting noted  Cardiovascular: Normal rate, regular rhythm and normal heart sounds.   Pulmonary/Chest:  Effort normal and breath sounds normal. No accessory muscle usage. No respiratory distress.  Lungs clear bilaterally, no crackles or wheezes bilaterally  Nursing note and vitals reviewed.         Assessment & Plan:  1. Herpes labialis Valtrex per orders. Follow-up if symptoms do not improve.  2. Acute non-recurrent maxillary sinusitis and upper respiratory tract infection, unspecified type Doxycycline per orders. Tessalon as needed for cough. Rest and hydration. Follow-up if symptoms worsen or do not improve in a few days.  Inda Coke PA-C 03/23/16

## 2016-05-10 DIAGNOSIS — Z08 Encounter for follow-up examination after completed treatment for malignant neoplasm: Secondary | ICD-10-CM | POA: Diagnosis not present

## 2016-05-10 DIAGNOSIS — Z85828 Personal history of other malignant neoplasm of skin: Secondary | ICD-10-CM | POA: Diagnosis not present

## 2016-05-10 DIAGNOSIS — L2084 Intrinsic (allergic) eczema: Secondary | ICD-10-CM | POA: Diagnosis not present

## 2016-05-16 ENCOUNTER — Encounter: Payer: Self-pay | Admitting: Family Medicine

## 2016-05-16 DIAGNOSIS — M25511 Pain in right shoulder: Principal | ICD-10-CM

## 2016-05-16 DIAGNOSIS — G8929 Other chronic pain: Secondary | ICD-10-CM

## 2016-05-25 DIAGNOSIS — M7701 Medial epicondylitis, right elbow: Secondary | ICD-10-CM | POA: Diagnosis not present

## 2016-05-25 DIAGNOSIS — M7541 Impingement syndrome of right shoulder: Secondary | ICD-10-CM | POA: Diagnosis not present

## 2016-07-08 ENCOUNTER — Encounter: Payer: Self-pay | Admitting: Family Medicine

## 2016-07-08 ENCOUNTER — Other Ambulatory Visit: Payer: Self-pay | Admitting: Family Medicine

## 2016-07-08 DIAGNOSIS — R5382 Chronic fatigue, unspecified: Secondary | ICD-10-CM

## 2016-07-08 DIAGNOSIS — G8929 Other chronic pain: Secondary | ICD-10-CM

## 2016-07-08 DIAGNOSIS — M545 Low back pain: Secondary | ICD-10-CM

## 2016-07-08 NOTE — Telephone Encounter (Signed)
Requesting: methylphenidate (RITALIN) 20 MG tablet Contract: 02/19/16 no control substance contract sign,  UDS: sample given, low risk next screen due 08/20/15.  Last OV: 01/18/2016 Last Refill: 02/18/16  Please Advise

## 2016-07-11 MED ORDER — HYDROCODONE-ACETAMINOPHEN 5-325 MG PO TABS: 1.0000 | ORAL_TABLET | Freq: Three times a day (TID) | ORAL | 0 refills | Status: DC | PRN

## 2016-07-11 MED ORDER — METHYLPHENIDATE HCL 20 MG PO TABS: 20.0000 mg | ORAL_TABLET | Freq: Two times a day (BID) | ORAL | 0 refills | Status: DC

## 2016-07-19 NOTE — Progress Notes (Signed)
[facility] at [facility] [address] [place], [place] [address] [phone]  Date:  07/20/2016   Name:  Patrick [name]   DOB:  1951/03/02   MRN:  [id number]  PCP:  Darreld Mclean, MD    Chief Complaint: Follow-up (Pt here for f/u visit. Will need refills? )   History of Present Illness:  Patrick [name] is a 66 y.o. very pleasant male patient who presents with the following:  Here today for a follow-up visit I last saw him in November for a tremor- we tried inderal for this with success This has helped with his tremor as long as he takes it.   He does not think that it makes him feel any more sleepy than he normally is; He does suffer from some chronic fatigu  He feels like his anxiety and mood are ok  He is on ritalin for sleepines and feels like it works well for him  He does take [place] on occasion- does not need a refill of this   He is not sure about his last tetanus shot- would like to have an update today He did have zostavax but would also like to get shingrix assuming it is covered by his insurnace   NCCSR: reviewed over the last year.  Last filled lyrica 90 day supply on 2/8, hydrocodone 30 tabs on 02/25/16, ritalin 60 tabs on 02/25/16, clonazepam 180 tabs on 02/16/16  Indication for chronic opioid: back and neck pain Medication and dose: hydrocodone 5  # pills per month: 30 but generally fills every 2-3 months Last UDS date: 12/17 Pain contract signed (Y/N): yes, today Date narcotic database last reviewed (include red flags): yes, today  He had cereal this am but nothing else   BP Readings from Last 3 Encounters:  07/20/16 120/80  03/23/16 132/87  01/18/16 114/84   Pulse Readings from Last 3 Encounters:  07/20/16 (!) 51  03/23/16 65  01/18/16 (!) 59     Patient Active Problem List   Diagnosis Date Noted  . Chronic fatigue 10/09/2015  . Generalized anxiety disorder 10/09/2015  .  Memory loss 10/09/2015  . Obstructive sleep apnea of adult 02/09/2015  . Benign prostatic hyperplasia with urinary obstruction 11/18/2013  . Acid reflux 11/18/2013  . Degenerative arthritis of lumbar spine 11/18/2013  . Headache, migraine 11/18/2013  . External hemorrhoids with other complication 41/32/4401  . Restless leg 05/27/2009    Past Medical History:  Diagnosis Date  . Anxiety   . Arthritis   . Basal cell carcinoma   . BPH (benign prostatic hyperplasia)   . Depression   . Dysphagia   . Fibromyalgia   . GERD (gastroesophageal reflux disease)    otc  tums  . Headache    migraines  . Herpes simplex type 1 infection   . History of migraine   . Hypertension    no longer on medication  . Insomnia   . Lower back pain   . Morton's neuroma   . OSA (obstructive sleep apnea)   . Primary snoring   . Recurrent canker sores   . Urinary frequency     Past Surgical History:  Procedure Laterality Date  . EPIDIDYMECTOMY     for spermatocele  . EYE SURGERY Bilateral    lasik eye surgery  . FOOT SURGERY Left   . INGUINAL HERNIA REPAIR Left    01/25/1999  . MOUTH SURGERY    .  pancreatic surgery r/t trauma    . SEPTOPLASTY N/A 02/09/2015   Procedure: SEPTOPLASTY;  Surgeon: Rozetta Nunnery, MD;  Location: [facility];  Service: ENT;  Laterality: N/A;  . SKIN CANCER EXCISION    . TONSILLECTOMY    . TRANSURETHRAL INCISION OF PROSTATE    . TURBINATE REDUCTION Bilateral 02/09/2015   Procedure: BILATERAL TURBINATE REDUCTION;  Surgeon: Rozetta Nunnery, MD;  Location: [facility];  Service: ENT;  Laterality: Bilateral;  . UVULOPALATOPHARYNGOPLASTY N/A 02/09/2015   Procedure: UVULOPALATOPHARYNGOPLASTY (UPPP);  Surgeon: Rozetta Nunnery, MD;  Location: [facility] OR;  Service: ENT;  Laterality: N/A;    Social History  Substance Use Topics  . Smoking status: Former Smoker    Packs/day: 2.00    Years: 30.00    Types: Cigarettes    Quit date: 10/29/2005  . Smokeless tobacco: Never Used   . Alcohol use 0.0 oz/week     Comment: Rare    Family History  Problem Relation Age of Onset  . Coronary artery disease Mother   . Stroke Mother   . Hypertension Father   . Anxiety disorder Brother   . Skin cancer Brother     No Known Allergies  Medication list has been reviewed and updated.  Current Outpatient Prescriptions on File Prior to Visit  Medication Sig Dispense Refill  . Cholecalciferol (VITAMIN D3) 2000 UNITS TABS Take 2,000 Units by mouth daily.    . clonazePAM (KLONOPIN) 0.5 MG tablet Take 1 or 2 at bedtime as needed for sleep 60 tablet 2  . fesoterodine (TOVIAZ) 8 MG TB24 tablet Take 8 mg by mouth daily.    . fluticasone (FLONASE) 50 MCG/ACT nasal spray Place 2 sprays into both nostrils daily. 43 g 3  . HYDROcodone-acetaminophen (NORCO/VICODIN) 5-325 MG tablet Take 1 tablet by mouth every 8 (eight) hours as needed for moderate pain. To fill in 30 days 30 tablet 0  . LYRICA 100 MG capsule Take 2 capsules (200 mg total) by mouth at bedtime. 180 capsule 1  . Melatonin 5 MG TABS Take 5 mg by mouth at bedtime.    . meloxicam (MOBIC) 15 MG tablet Take 1 tablet (15 mg total) by mouth daily. 90 tablet 3  . methylphenidate (RITALIN) 20 MG tablet Take 1 tablet (20 mg total) by mouth 2 (two) times daily. 60 tablet 0  . omeprazole (PRILOSEC) 40 MG capsule Take 1 capsule (40 mg total) by mouth daily. 90 capsule 3  . propranolol (INDERAL) 10 MG tablet Take every 12 hours as needed for tremor 180 tablet 3  . ranitidine (ZANTAC) 300 MG tablet Take 1 tablet (300 mg total) by mouth daily. 90 tablet 3  . sertraline (ZOLOFT) 100 MG tablet Take 2 tablets (200 mg total) by mouth daily. 180 tablet 3  . sucralfate (CARAFATE) 1 GM/10ML suspension Take 10 mLs (1 g total) by mouth 4 (four) times daily -  with meals and at bedtime. 420 mL 0  . traZODone (DESYREL) 100 MG tablet Take 1/2 or 1 tablet (50 or 100 mg) at bedtime as needed for sleep 90 tablet 3  . valACYclovir (VALTREX) 1000 MG  tablet Take 1 tablet (1,000 mg total) by mouth 2 (two) times daily. Take two tabs now and then twelve hours later take two more tabs. 4 tablet 0  . vitamin B-12 (CYANOCOBALAMIN) 500 MCG tablet Take 500 mcg by mouth daily.     No current facility-administered medications on file prior to visit.     Review of  Systems:  As per HPI- otherwise negative.   Physical Examination: Vitals:   07/20/16 1055  BP: 120/80  Pulse: (!) 51  Temp: 97.6 F (36.4 C)   Vitals:   07/20/16 1055  Weight: 181 lb 12.8 oz (82.5 kg)  Height: 6' (1.829 m)   Body mass index is 24.66 kg/m. Ideal Body Weight: Weight in (lb) to have BMI = 25: 183.9  GEN: WDWN, NAD, Non-toxic, A & O x 3, normal weight, looks well HEENT: Atraumatic, Normocephalic. Neck supple. No masses, No LAD. Ears and Nose: No external deformity. CV: RRR, No M/G/R. No JVD. No thrill. No extra heart sounds. PULM: CTA B, no wheezes, crackles, rhonchi. No retractions. No resp. distress. No accessory muscle use. ABD: S, NT, ND EXTR: No c/c/e NEURO Normal gait.  PSYCH: Normally interactive. Conversant. Not depressed or anxious appearing.  Calm demeanor.  He notes a chronically itchy and dry spot on his right shoulder blade- suspect this may be due to notalgia paresthetica He also notes 2 rough, raised areas on his left pinky finger.  Appears c/w warts  VC obtained.  Cryotherapy to warts on finger x3 each   Assessment and Plan: Dry skin - Plan: betamethasone dipropionate (DIPROLENE) 0.05 % cream  Chronic low back pain, unspecified back pain laterality, with sciatica presence unspecified - Plan: HYDROcodone-acetaminophen (NORCO/VICODIN) 5-325 MG tablet  Chronic fatigue - Plan: methylphenidate (RITALIN) 20 MG tablet  Screening for prostate cancer - Plan: PSA  Screening for hyperlipidemia - Plan: Lipid panel  Screening for diabetes mellitus - Plan: Comprehensive metabolic panel, Hemoglobin A1c  Screening for deficiency anemia - Plan:  CBC  Leukopenia, unspecified type - Plan: CBC  Chronic pain syndrome - Plan: meloxicam (MOBIC) 15 MG tablet  Here today to follow--up on a few concerns  Inderal is helping with his tremor Refills of his medications today as above Completed CSC today Will plan further follow- up pending labs.  Meds ordered this encounter  Medications  . DISCONTD: betamethasone dipropionate (DIPROLENE) 0.05 % cream    Sig: Apply topically 2 (two) times daily.  Marland Kitchen HYDROcodone-acetaminophen (NORCO/VICODIN) 5-325 MG tablet    Sig: Take 1 tablet by mouth every 8 (eight) hours as needed for moderate pain.    Dispense:  30 tablet    Refill:  0  . methylphenidate (RITALIN) 20 MG tablet    Sig: Take 1 tablet (20 mg total) by mouth 2 (two) times daily.    Dispense:  60 tablet    Refill:  0  . Zoster Vac Recomb Adjuvanted (SHINGRIX) injection    Sig: Inject 0.5 mLs into the muscle once. Give both doses in series as directed    Dispense:  0.5 mL    Refill:  0  . betamethasone dipropionate (DIPROLENE) 0.05 % cream    Sig: Apply topically 2 (two) times daily. Use as needed    Dispense:  30 g    Refill:  3  . meloxicam (MOBIC) 15 MG tablet    Sig: Take 1 tablet (15 mg total) by mouth daily.    Dispense:  90 tablet    Refill:  3     Signed Lamar Blinks, MD  Results for orders placed or performed in visit on 07/20/16  CBC  Result Value Ref Range   WBC 3.5 (L) 4.0 - 10.5 K/uL   RBC 4.76 4.22 - 5.81 Mil/uL   Platelets 120.0 (L) 150.0 - 400.0 K/uL   Hemoglobin 15.1 13.0 - 17.0 g/dL   HCT  44.5 39.0 - 52.0 %   MCV 93.4 78.0 - 100.0 fl   MCHC 33.8 30.0 - 36.0 g/dL   RDW 13.4 11.5 - 15.5 %  Comprehensive metabolic panel  Result Value Ref Range   Sodium 140 135 - 145 mEq/L   Potassium 4.5 3.5 - 5.1 mEq/L   Chloride 104 96 - 112 mEq/L   CO2 29 19 - 32 mEq/L   Glucose, Bld 96 70 - 99 mg/dL   BUN 17 6 - 23 mg/dL   Creatinine, Ser 1.00 0.40 - 1.50 mg/dL   Total Bilirubin 0.7 0.2 - 1.2 mg/dL    Alkaline Phosphatase 63 39 - 117 U/L   AST 17 0 - 37 U/L   ALT 18 0 - 53 U/L   Total Protein 6.5 6.0 - 8.3 g/dL   Albumin 4.5 3.5 - 5.2 g/dL   Calcium 9.3 8.4 - 10.5 mg/dL   GFR 80.02 >60.00 mL/min  Lipid panel  Result Value Ref Range   Cholesterol 142 0 - 200 mg/dL   Triglycerides 95.0 0.0 - 149.0 mg/dL   HDL 46.10 >39.00 mg/dL   VLDL 19.0 0.0 - 40.0 mg/dL   LDL Cholesterol 77 0 - 99 mg/dL   Total CHOL/HDL Ratio 3    NonHDL 96.25   Hemoglobin A1c  Result Value Ref Range   Hgb A1c MFr Bld 5.3 4.6 - 6.5 %  PSA  Result Value Ref Range   PSA 0.33 0.10 - 4.00 ng/mL

## 2016-07-20 ENCOUNTER — Encounter: Payer: Self-pay | Admitting: Family Medicine

## 2016-07-20 ENCOUNTER — Ambulatory Visit (INDEPENDENT_AMBULATORY_CARE_PROVIDER_SITE_OTHER): Payer: BLUE CROSS/BLUE SHIELD | Admitting: Family Medicine

## 2016-07-20 VITALS — BP 120/80 | HR 51 | Temp 97.6°F | Ht 72.0 in | Wt 181.8 lb

## 2016-07-20 DIAGNOSIS — Z125 Encounter for screening for malignant neoplasm of prostate: Secondary | ICD-10-CM

## 2016-07-20 DIAGNOSIS — M545 Low back pain: Secondary | ICD-10-CM

## 2016-07-20 DIAGNOSIS — Z87891 Personal history of nicotine dependence: Secondary | ICD-10-CM

## 2016-07-20 DIAGNOSIS — L853 Xerosis cutis: Secondary | ICD-10-CM | POA: Diagnosis not present

## 2016-07-20 DIAGNOSIS — R5382 Chronic fatigue, unspecified: Secondary | ICD-10-CM

## 2016-07-20 DIAGNOSIS — G8929 Other chronic pain: Secondary | ICD-10-CM | POA: Diagnosis not present

## 2016-07-20 DIAGNOSIS — Z131 Encounter for screening for diabetes mellitus: Secondary | ICD-10-CM | POA: Diagnosis not present

## 2016-07-20 DIAGNOSIS — Z130 Encounter for screening for diseases of the blood and blood-forming organs and certain disorders involving the immune mechanism: Secondary | ICD-10-CM

## 2016-07-20 DIAGNOSIS — Z23 Encounter for immunization: Secondary | ICD-10-CM | POA: Diagnosis not present

## 2016-07-20 DIAGNOSIS — G894 Chronic pain syndrome: Secondary | ICD-10-CM | POA: Diagnosis not present

## 2016-07-20 DIAGNOSIS — D72819 Decreased white blood cell count, unspecified: Secondary | ICD-10-CM | POA: Diagnosis not present

## 2016-07-20 DIAGNOSIS — Z13220 Encounter for screening for lipoid disorders: Secondary | ICD-10-CM

## 2016-07-20 LAB — COMPREHENSIVE METABOLIC PANEL
ALT: 18 U/L (ref 0–53)
AST: 17 U/L (ref 0–37)
Albumin: 4.5 g/dL (ref 3.5–5.2)
Alkaline Phosphatase: 63 U/L (ref 39–117)
BUN: 17 mg/dL (ref 6–23)
CALCIUM: 9.3 mg/dL (ref 8.4–10.5)
CHLORIDE: 104 meq/L (ref 96–112)
CO2: 29 meq/L (ref 19–32)
Creatinine, Ser: 1 mg/dL (ref 0.40–1.50)
GFR: 80.02 mL/min (ref >60.00)
GLUCOSE: 96 mg/dL (ref 70–99)
Potassium: 4.5 mEq/L (ref 3.5–5.1)
SODIUM: 140 meq/L (ref 135–145)
Total Bilirubin: 0.7 mg/dL (ref 0.2–1.2)
Total Protein: 6.5 g/dL (ref 6.0–8.3)

## 2016-07-20 LAB — LIPID PANEL
CHOL/HDL RATIO: 3
Cholesterol: 142 mg/dL (ref 0–200)
HDL: 46.1 mg/dL (ref >39.00)
LDL CALC: 77 mg/dL (ref 0–99)
NONHDL: 96.25
TRIGLYCERIDES: 95 mg/dL (ref 0.0–149.0)
VLDL: 19 mg/dL (ref 0.0–40.0)

## 2016-07-20 LAB — CBC
HCT: 44.5 % (ref 39.0–52.0)
Hemoglobin: 15.1 g/dL (ref 13.0–17.0)
MCHC: 33.8 g/dL (ref 30.0–36.0)
MCV: 93.4 fl (ref 78.0–100.0)
Platelets: 120 10*3/uL — ABNORMAL LOW (ref 150.0–400.0)
RBC: 4.76 Mil/uL (ref 4.22–5.81)
RDW: 13.4 % (ref 11.5–15.5)
WBC: 3.5 10*3/uL — ABNORMAL LOW (ref 4.0–10.5)

## 2016-07-20 LAB — HEMOGLOBIN A1C: Hgb A1c MFr Bld: 5.3 % (ref 4.6–6.5)

## 2016-07-20 LAB — PSA: PSA: 0.33 ng/mL (ref 0.10–4.00)

## 2016-07-20 MED ORDER — BETAMETHASONE DIPROPIONATE 0.05 % EX CREA: TOPICAL_CREAM | Freq: Two times a day (BID) | CUTANEOUS | 3 refills | Status: DC

## 2016-07-20 MED ORDER — ZOSTER VAC RECOMB ADJUVANTED 50 MCG/0.5ML IM SUSR: 0.5000 mL | Freq: Once | INTRAMUSCULAR | 0 refills | Status: AC

## 2016-07-20 MED ORDER — METHYLPHENIDATE HCL 20 MG PO TABS: 20.0000 mg | ORAL_TABLET | Freq: Two times a day (BID) | ORAL | 0 refills | Status: DC

## 2016-07-20 MED ORDER — MELOXICAM 15 MG PO TABS: 15.0000 mg | ORAL_TABLET | Freq: Every day | ORAL | 3 refills | Status: DC

## 2016-07-20 MED ORDER — HYDROCODONE-ACETAMINOPHEN 5-325 MG PO TABS: 1.0000 | ORAL_TABLET | Freq: Three times a day (TID) | ORAL | 0 refills | Status: DC | PRN

## 2016-07-20 NOTE — Patient Instructions (Addendum)
It was very nice to see you today- take care and I will send the rest of your rx to your mail- away pharmacy so they will be ready to refill later on Continue to use your cream as needed We will get labs for you today and I will be in touch with these results  I sent an rx for your steroid cream to your local [facility] Give [facility] a call and see if they cover the shingrix vaccine; if you like this can be done at your convenience

## 2016-07-27 ENCOUNTER — Telehealth: Payer: Self-pay | Admitting: Family Medicine

## 2016-07-27 NOTE — Telephone Encounter (Signed)
Noted  

## 2016-07-27 NOTE — Telephone Encounter (Signed)
FYI-  Relation to SM:[facility] Call back number:[phone]   Reason for call:  Patient states shingles vaccination is covered 100% by insurance, patient scheduled nurse visit for 07/28/16 at 4pm.

## 2016-07-28 ENCOUNTER — Ambulatory Visit (INDEPENDENT_AMBULATORY_CARE_PROVIDER_SITE_OTHER): Payer: BLUE CROSS/BLUE SHIELD

## 2016-07-28 ENCOUNTER — Telehealth: Payer: Self-pay

## 2016-07-28 ENCOUNTER — Other Ambulatory Visit (INDEPENDENT_AMBULATORY_CARE_PROVIDER_SITE_OTHER): Payer: BLUE CROSS/BLUE SHIELD

## 2016-07-28 DIAGNOSIS — Z23 Encounter for immunization: Secondary | ICD-10-CM | POA: Diagnosis not present

## 2016-07-28 DIAGNOSIS — D72819 Decreased white blood cell count, unspecified: Secondary | ICD-10-CM | POA: Diagnosis not present

## 2016-07-28 NOTE — Progress Notes (Signed)
Patient in for Shingrix Immunization per order from [facility]..Dated 07/20/16.  Given 0.34ml IM Right deltoid. Patient tolerated well.   Reschedulede patient for 2nd dose of Shingrix. Given information sheet.

## 2016-07-28 NOTE — Telephone Encounter (Signed)
Patent in office today per order from Dr. Janett Billow Copland for Shingrix 1st dose. Patient denies any allergies to eggs or any moderate illness on this day. Per chart patient has NKDA.  Patient given Shingrix 0.5 ml IM right deltoid and tolerated well. Patient given information sheet for immunization and return appointment for 2nd dose.   Upon check-out patient complained to reception of difficulty swallowing and throat feeling "funny" patient brought back to treatment room. Dr. Lorelei Pont notified. Vital Signs were as follows. BP= 130/70,P=56,)2 SAT= 99%,Temp= 98.2. Patient denies any other symptoms. Upon examination by Dr. Lorelei Pont no redness or swelling  in throat noted vital signs stable. Patient advised if symptoms worsen to take 2 Benadryl tablets and if not better go directly to ED. Patient advised by Dr. Lorelei Pont  it may not be a god idea for him to have 2nd dose if he in fact is having a reaction to dose (Shingrix) given today. Patient states he understood.      Patient left office in what appeared to be stable condition. No complaints voiced.

## 2016-07-29 ENCOUNTER — Ambulatory Visit
Admission: RE | Admit: 2016-07-29 | Discharge: 2016-07-29 | Disposition: A | Discharge status: Home / Self Care | Payer: BLUE CROSS/BLUE SHIELD | Source: Ambulatory Visit | Attending: Family Medicine | Admitting: Family Medicine

## 2016-07-29 DIAGNOSIS — Z87891 Personal history of nicotine dependence: Secondary | ICD-10-CM | POA: Diagnosis not present

## 2016-07-29 LAB — CBC
HEMATOCRIT: 44.9 % (ref 39.0–52.0)
Hemoglobin: 15.2 g/dL (ref 13.0–17.0)
MCHC: 34 g/dL (ref 30.0–36.0)
MCV: 93.3 fl (ref 78.0–100.0)
Platelets: 136 10*3/uL — ABNORMAL LOW (ref 150.0–400.0)
RBC: 4.81 Mil/uL (ref 4.22–5.81)
RDW: 13.3 % (ref 11.5–15.5)
WBC: 4.2 10*3/uL (ref 4.0–10.5)

## 2016-07-29 NOTE — Telephone Encounter (Signed)
Follow up call made to patient this am. Denies and symptoms at this time. Advised to call if any symptoms should arise. Patient agreed states he will be in for next injection as scheduled if ok'd by provider.

## 2016-08-01 ENCOUNTER — Ambulatory Visit: Payer: Self-pay

## 2016-09-12 ENCOUNTER — Encounter: Payer: Self-pay | Admitting: Family Medicine

## 2016-09-12 DIAGNOSIS — G894 Chronic pain syndrome: Secondary | ICD-10-CM

## 2016-09-12 MED ORDER — CLONAZEPAM 0.5 MG PO TABS: ORAL_TABLET | ORAL | 2 refills | Status: DC

## 2016-09-12 MED ORDER — LYRICA 100 MG PO CAPS: 200.0000 mg | ORAL_CAPSULE | Freq: Every day | ORAL | 2 refills | Status: DC

## 2016-09-18 ENCOUNTER — Telehealth: Payer: Self-pay | Admitting: Family Medicine

## 2016-09-18 DIAGNOSIS — R5382 Chronic fatigue, unspecified: Secondary | ICD-10-CM

## 2016-09-19 MED ORDER — METHYLPHENIDATE HCL 20 MG PO TABS: 20.0000 mg | ORAL_TABLET | Freq: Two times a day (BID) | ORAL | 0 refills | Status: DC

## 2016-09-19 NOTE — Telephone Encounter (Signed)
Pt is requesting refill on Ritalin, last filled 07/20/2016.

## 2016-09-19 NOTE — Telephone Encounter (Signed)
NCCSR: last fill of ritalin on 5/10 He did a UDS last June, contract is done [facility]

## 2016-09-22 ENCOUNTER — Other Ambulatory Visit: Payer: Self-pay | Admitting: Family Medicine

## 2016-09-22 DIAGNOSIS — R5382 Chronic fatigue, unspecified: Secondary | ICD-10-CM

## 2016-09-22 DIAGNOSIS — R109 Unspecified abdominal pain: Secondary | ICD-10-CM

## 2016-09-22 MED ORDER — METHYLPHENIDATE HCL 20 MG PO TABS: 20.0000 mg | ORAL_TABLET | Freq: Two times a day (BID) | ORAL | 0 refills | Status: DC

## 2016-09-22 NOTE — Progress Notes (Signed)
Called pt to discuss his ritalin- [facility] to [facility].  Will mail to them.  He also has complaint of intermittent abd pain which seems worse after eating, he has tried zantac and antacids.   Will order a RUQ [place] for him

## 2016-09-25 ENCOUNTER — Ambulatory Visit (HOSPITAL_BASED_OUTPATIENT_CLINIC_OR_DEPARTMENT_OTHER)
Admission: RE | Admit: 2016-09-25 | Discharge: 2016-09-25 | Disposition: A | Discharge status: Home / Self Care | Payer: BLUE CROSS/BLUE SHIELD | Source: Ambulatory Visit | Attending: Family Medicine | Admitting: Family Medicine

## 2016-09-25 DIAGNOSIS — R1013 Epigastric pain: Secondary | ICD-10-CM | POA: Diagnosis not present

## 2016-09-25 DIAGNOSIS — R109 Unspecified abdominal pain: Secondary | ICD-10-CM | POA: Diagnosis present

## 2016-09-26 ENCOUNTER — Encounter: Payer: Self-pay | Admitting: Family Medicine

## 2016-09-29 ENCOUNTER — Ambulatory Visit: Payer: Self-pay | Admitting: Family Medicine

## 2016-09-29 ENCOUNTER — Ambulatory Visit (INDEPENDENT_AMBULATORY_CARE_PROVIDER_SITE_OTHER): Payer: BLUE CROSS/BLUE SHIELD | Admitting: Medical

## 2016-09-29 VITALS — BP 126/85 | HR 63 | Temp 98.4°F | Resp 16 | Ht 72.0 in | Wt 181.0 lb

## 2016-09-29 DIAGNOSIS — H938X3 Other specified disorders of ear, bilateral: Secondary | ICD-10-CM

## 2016-09-29 DIAGNOSIS — J0100 Acute maxillary sinusitis, unspecified: Secondary | ICD-10-CM | POA: Diagnosis not present

## 2016-09-29 DIAGNOSIS — R05 Cough: Secondary | ICD-10-CM

## 2016-09-29 DIAGNOSIS — R059 Cough, unspecified: Secondary | ICD-10-CM

## 2016-09-29 MED ORDER — AMOXICILLIN-POT CLAVULANATE 875-125 MG PO TABS: 1.0000 | ORAL_TABLET | Freq: Two times a day (BID) | ORAL | 0 refills | Status: DC

## 2016-09-29 MED ORDER — BENZONATATE 100 MG PO CAPS: 100.0000 mg | ORAL_CAPSULE | Freq: Three times a day (TID) | ORAL | 0 refills | Status: DC | PRN

## 2016-09-29 NOTE — Patient Instructions (Signed)
Your appear to have a sinus infection following allergic rhinitis. I am prescribing augmentin antibiotic for the infection. Advise to use flonase daily. For your associated cough benzonatate, I prescribed cough medicine.  Ear pressure likely associated with sinus infection.   Rest, hydrate, tylenol for fever.  Follow up in 7 days or as needed.

## 2016-09-29 NOTE — Progress Notes (Signed)
Subjective:    Patient ID: Patrick [name], male    DOB: 1950-12-02, 67 y.o.   MRN: [id number]  HPI   Pt in with some facial pressure for about one week. Nasal congestion, pnd, and some cough. Pt had some mild st. Pt tried some nasal saline solution and states that it made pressure worse.   No colored mucous.   Pt states cough moderate. No wheezing.  Pt states some sneezing past week.   Does not report hx of allergies in summers.  No fever, no chills or sweats.  Pt has flonase but has not been using consistent.    Review of Systems  Constitutional: Negative for chills and fatigue.  HENT: Positive for congestion, rhinorrhea, sinus pain and sinus pressure. Negative for ear pain, nosebleeds, sneezing and sore throat.   Respiratory: Positive for cough. Negative for chest tightness, shortness of breath and wheezing.   Cardiovascular: Negative for chest pain and palpitations.  Gastrointestinal: Negative for abdominal pain, diarrhea, nausea and vomiting.  Musculoskeletal: Negative for back pain and gait problem.  Skin: Negative for rash.  Neurological: Negative for dizziness.  Hematological: Negative for adenopathy. Does not bruise/bleed easily.  Psychiatric/Behavioral: Negative for behavioral problems and confusion.    Past Medical History:  Diagnosis Date  . Anxiety   . Arthritis   . Basal cell carcinoma   . BPH (benign prostatic hyperplasia)   . Depression   . Dysphagia   . Fibromyalgia   . GERD (gastroesophageal reflux disease)    otc  tums  . Headache    migraines  . Herpes simplex type 1 infection   . History of migraine   . Hypertension    no longer on medication  . Insomnia   . Lower back pain   . Morton's neuroma   . OSA (obstructive sleep apnea)   . Primary snoring   . Recurrent canker sores   . Urinary frequency      Social History   Social History  . Marital status: Married    Spouse name: N/A  . Number of children: 2  . Years of  education: BS    Occupational History  . engineer    Social History Main Topics  . Smoking status: Former Smoker    Packs/day: 2.00    Years: 30.00    Types: Cigarettes    Quit date: 10/29/2005  . Smokeless tobacco: Never Used  . Alcohol use 0.0 oz/week     Comment: Rare  . Drug use: No  . Sexual activity: Not on file   Other Topics Concern  . Not on file   Social History Narrative   Denies caffeine use     Past Surgical History:  Procedure Laterality Date  . EPIDIDYMECTOMY     for spermatocele  . EYE SURGERY Bilateral    lasik eye surgery  . FOOT SURGERY Left   . INGUINAL HERNIA REPAIR Left    01/25/1999  . MOUTH SURGERY    . pancreatic surgery r/t trauma    . SEPTOPLASTY N/A 02/09/2015   Procedure: SEPTOPLASTY;  Surgeon: Rozetta Nunnery, MD;  Location: [facility];  Service: ENT;  Laterality: N/A;  . SKIN CANCER EXCISION    . TONSILLECTOMY    . TRANSURETHRAL INCISION OF PROSTATE    . TURBINATE REDUCTION Bilateral 02/09/2015   Procedure: BILATERAL TURBINATE REDUCTION;  Surgeon: Rozetta Nunnery, MD;  Location: [facility];  Service: ENT;  Laterality: Bilateral;  . UVULOPALATOPHARYNGOPLASTY N/A 02/09/2015  Procedure: UVULOPALATOPHARYNGOPLASTY (UPPP);  Surgeon: Rozetta Nunnery, MD;  Location: [facility];  Service: ENT;  Laterality: N/A;    Family History  Problem Relation Age of Onset  . Coronary artery disease Mother   . Stroke Mother   . Hypertension Father   . Anxiety disorder Brother   . Skin cancer Brother     No Known Allergies  Current Outpatient Prescriptions on File Prior to Visit  Medication Sig Dispense Refill  . betamethasone dipropionate (DIPROLENE) 0.05 % cream Apply topically 2 (two) times daily. Use as needed 30 g 3  . Cholecalciferol (VITAMIN D3) 2000 UNITS TABS Take 2,000 Units by mouth daily.    . clonazePAM (KLONOPIN) 0.5 MG tablet Take 1 or 2 at bedtime as needed for sleep 60 tablet 2  . fesoterodine (TOVIAZ) 8 MG TB24 tablet Take 8 mg by  mouth daily.    . fluticasone (FLONASE) 50 MCG/ACT nasal spray Place 2 sprays into both nostrils daily. 43 g 3  . HYDROcodone-acetaminophen (NORCO/VICODIN) 5-325 MG tablet Take 1 tablet by mouth every 8 (eight) hours as needed for moderate pain. 30 tablet 0  . LYRICA 100 MG capsule Take 2 capsules (200 mg total) by mouth at bedtime. 180 capsule 2  . Melatonin 5 MG TABS Take 5 mg by mouth at bedtime.    . meloxicam (MOBIC) 15 MG tablet Take 1 tablet (15 mg total) by mouth daily. 90 tablet 3  . methylphenidate (RITALIN) 20 MG tablet Take 1 tablet (20 mg total) by mouth 2 (two) times daily. 180 tablet 0  . omeprazole (PRILOSEC) 40 MG capsule Take 1 capsule (40 mg total) by mouth daily. 90 capsule 3  . propranolol (INDERAL) 10 MG tablet Take every 12 hours as needed for tremor 180 tablet 3  . ranitidine (ZANTAC) 300 MG tablet Take 1 tablet (300 mg total) by mouth daily. 90 tablet 3  . sertraline (ZOLOFT) 100 MG tablet Take 2 tablets (200 mg total) by mouth daily. 180 tablet 3  . sucralfate (CARAFATE) 1 GM/10ML suspension Take 10 mLs (1 g total) by mouth 4 (four) times daily -  with meals and at bedtime. 420 mL 0  . traZODone (DESYREL) 100 MG tablet Take 1/2 or 1 tablet (50 or 100 mg) at bedtime as needed for sleep 90 tablet 3  . valACYclovir (VALTREX) 1000 MG tablet Take 1 tablet (1,000 mg total) by mouth 2 (two) times daily. Take two tabs now and then twelve hours later take two more tabs. 4 tablet 0  . vitamin B-12 (CYANOCOBALAMIN) 500 MCG tablet Take 500 mcg by mouth daily.     No current facility-administered medications on file prior to visit.     BP 126/85   Pulse 63   Temp 98.4 F (36.9 C) (Oral)   Resp 16   Ht 6' (1.829 m)   Wt 181 lb (82.1 kg)   SpO2 97%   BMI 24.55 kg/m       Objective:   Physical Exam  General  Mental Status - Alert. General Appearance - Well groomed. Not in acute distress.  Skin Rashes- No Rashes.  HEENT Head- Normal. Ear Auditory Canal - Left-  Normal. Right - Normal.Tympanic Membrane- Left- faint minimal central redness otherwise normal.. Right- Normal. Eye Sclera/Conjunctiva- Left- Normal. Right- Normal. Nose & Sinuses Nasal Mucosa- Left-  Boggy and Congested. Right-  Boggy and  Congested.Bilateral maxillary and frontal sinus pressure.(worse maxillary) Mouth & Throat Lips: Upper Lip- Normal: no dryness, cracking, pallor, cyanosis,  or vesicular eruption. Lower Lip-Normal: no dryness, cracking, pallor, cyanosis or vesicular eruption. Buccal Mucosa- Bilateral- No Aphthous ulcers. Oropharynx- No Discharge or Erythema. Tonsils: Characteristics- Bilateral- No Erythema or Congestion. Size/Enlargement- Bilateral- No enlargement(prior surgery uvula absent). Discharge- bilateral-None.  Neck Neck- Supple. No Masses.   Chest and Lung Exam Auscultation: Breath Sounds:-Clear even and unlabored.  Cardiovascular Auscultation:Rythm- Regular, rate and rhythm. Murmurs & Other Heart Sounds:Ausculatation of the heart reveal- No Murmurs.  Lymphatic Head & Neck General Head & Neck Lymphatics: Bilateral: Description- No Localized lymphadenopathy.       Assessment & Plan:  Your appear to have a sinus infection following allergic rhinitis. I am prescribing augmentin antibiotic for the infection. Advise to use flonase daily. For your associated cough benzonatate, I prescribed cough medicine.  Ear pressure likely associated with sinus infection.   Rest, hydrate, tylenol for fever.  Follow up in 7 days or as needed.  Note also pt understand not to use otc saline saline rinse  Josie [place], Percell Miller, [place]

## 2016-10-04 ENCOUNTER — Encounter: Payer: Self-pay | Admitting: Gastroenterology

## 2016-10-28 ENCOUNTER — Ambulatory Visit (INDEPENDENT_AMBULATORY_CARE_PROVIDER_SITE_OTHER): Payer: BLUE CROSS/BLUE SHIELD | Admitting: Behavioral Health

## 2016-10-28 DIAGNOSIS — Z23 Encounter for immunization: Secondary | ICD-10-CM | POA: Diagnosis not present

## 2016-10-28 NOTE — Progress Notes (Signed)
Pre visit review using our clinic review tool, if applicable. No additional management support is needed unless otherwise documented below in the visit note.  Patient came in office for 2nd Shingrix vaccination. IM injection was given in the left deltoid. Patient tolerated it well.

## 2016-11-25 DIAGNOSIS — L2084 Intrinsic (allergic) eczema: Secondary | ICD-10-CM | POA: Diagnosis not present

## 2016-11-25 DIAGNOSIS — Z85828 Personal history of other malignant neoplasm of skin: Secondary | ICD-10-CM | POA: Diagnosis not present

## 2016-11-25 DIAGNOSIS — Z08 Encounter for follow-up examination after completed treatment for malignant neoplasm: Secondary | ICD-10-CM | POA: Diagnosis not present

## 2016-12-12 ENCOUNTER — Other Ambulatory Visit: Payer: Self-pay | Admitting: Emergency Medicine

## 2016-12-12 MED ORDER — CLONAZEPAM 0.5 MG PO TABS: ORAL_TABLET | ORAL | 2 refills | Status: DC

## 2016-12-12 NOTE — Telephone Encounter (Signed)
Requesting: Contract: 07/20/16 controlled substance contract signed.  No uds sample. Last OV: 07/20/16 Last Refill: 09/12/16  Please Advise 07/20/16

## 2016-12-12 NOTE — Telephone Encounter (Signed)
NCCSR: he just filled his klonopin on 9/24, last refill Will fax this in for pharmacy to hold on file

## 2016-12-13 ENCOUNTER — Encounter: Payer: Self-pay | Admitting: Family Medicine

## 2016-12-14 NOTE — Progress Notes (Signed)
[place] [address], [place], [place] [address] [phone]  Date:  12/15/2016   Name:  Patrick [name]   DOB:  Aug 16, 1950   MRN:  [id number]  PCP:  Darreld Mclean, MD    Chief Complaint: Hip Pain (c/o constant right side hip pain. Denies any injury. Flu vaccine today. )   History of Present Illness:  Patrick [name] is a 66 y.o. very pleasant male patient who presents with the following:  Here today for a follow-up visit Would like his flu shot today From our last visit in May of this year:  I last saw him in November for a tremor- we tried inderal for this with success This has helped with his tremor as long as he takes it.   He does not think that it makes him feel any more sleepy than he normally is; He does suffer from some chronic fatigu  He feels like his anxiety and mood are ok He is on ritalin for sleepines and feels like it works well for him He does take [place] on occasion- does not need a refill of this  He is not sure about his last tetanus shot- would like to have an update today He did have zostavax but would also like to get shingrix assuming it is covered by his insurnace   NCCSR: reviewed over the last year.  Last filled lyrica 90 day supply on 2/8, hydrocodone 30 tabs on 02/25/16, ritalin 60 tabs on 02/25/16, clonazepam 180 tabs on 02/16/16  Indication for chronic opioid: back and neck pain Medication and dose: hydrocodone 5             # pills per month: 30 but generally fills every 2-3 months Last UDS date: 12/17 Pain contract signed (Y/N): yes, today Date narcotic database last reviewed (include red flags): yes, today  NCCSR: has not filled hydrocodone since May of this year.  He does fill lyrica and klonopin, ritalin on a more regular basis  He has noted more pain in his RIGHT hip for about one month.  Insidious onset He thought it would go away but it has not so  far He has not really had hip problems in the past  The hip does not feel stiff, it hurts all the time including at night Not really worse with exercise He is still trying to walk for exercise  Wt Readings from Last 3 Encounters:  12/15/16 180 lb 12.8 oz (82 kg)  09/29/16 181 lb (82.1 kg)  07/20/16 181 lb 12.8 oz (82.5 kg)   He is using a hydrocodone every now and then for his pains No fever or chills No change in his routine The left hip is fine  Back and neck are about the same, varies with day and time Can be worse after he plays a round of golf, but other times is better with exercise  His UDS and contact are UTD, have reviewed his [place] Patient Active Problem List   Diagnosis Date Noted  . Hearing loss 12/15/2016  . Chronic fatigue 10/09/2015  . Generalized anxiety disorder 10/09/2015  . Memory loss 10/09/2015  . Obstructive sleep apnea of adult 02/09/2015  . Benign prostatic hyperplasia with urinary obstruction 11/18/2013  . Acid reflux 11/18/2013  . Degenerative arthritis of lumbar spine 11/18/2013  . Headache, migraine 11/18/2013  . External hemorrhoids with other complication 68/34/1962  . Restless leg 05/27/2009  Past Medical History:  Diagnosis Date  . Anxiety   . Arthritis   . Basal cell carcinoma   . BPH (benign prostatic hyperplasia)   . Depression   . Dysphagia   . Fibromyalgia   . GERD (gastroesophageal reflux disease)    otc  tums  . Headache    migraines  . Herpes simplex type 1 infection   . History of migraine   . Hypertension    no longer on medication  . Insomnia   . Lower back pain   . Morton's neuroma   . OSA (obstructive sleep apnea)   . Primary snoring   . Recurrent canker sores   . Urinary frequency     Past Surgical History:  Procedure Laterality Date  . EPIDIDYMECTOMY     for spermatocele  . EYE SURGERY Bilateral    lasik eye surgery  . FOOT SURGERY Left   . INGUINAL HERNIA REPAIR Left    01/25/1999  . MOUTH SURGERY     . pancreatic surgery r/t trauma    . SEPTOPLASTY N/A 02/09/2015   Procedure: SEPTOPLASTY;  Surgeon: Rozetta Nunnery, MD;  Location: [facility];  Service: ENT;  Laterality: N/A;  . SKIN CANCER EXCISION    . TONSILLECTOMY    . TRANSURETHRAL INCISION OF PROSTATE    . TURBINATE REDUCTION Bilateral 02/09/2015   Procedure: BILATERAL TURBINATE REDUCTION;  Surgeon: Rozetta Nunnery, MD;  Location: [facility];  Service: ENT;  Laterality: Bilateral;  . UVULOPALATOPHARYNGOPLASTY N/A 02/09/2015   Procedure: UVULOPALATOPHARYNGOPLASTY (UPPP);  Surgeon: Rozetta Nunnery, MD;  Location: [facility];  Service: ENT;  Laterality: N/A;    Social History  Substance Use Topics  . Smoking status: Former Smoker    Packs/day: 2.00    Years: 30.00    Types: Cigarettes    Quit date: 10/29/2005  . Smokeless tobacco: Never Used  . Alcohol use 0.0 oz/week     Comment: Rare    Family History  Problem Relation Age of Onset  . Coronary artery disease Mother   . Stroke Mother   . Hypertension Father   . Anxiety disorder Brother   . Skin cancer Brother     No Known Allergies  Medication list has been reviewed and updated.  Current Outpatient Prescriptions on File Prior to Visit  Medication Sig Dispense Refill  . benzonatate (TESSALON) 100 MG capsule Take 1 capsule (100 mg total) by mouth 3 (three) times daily as needed for cough. 21 capsule 0  . betamethasone dipropionate (DIPROLENE) 0.05 % cream Apply topically 2 (two) times daily. Use as needed 30 g 3  . Cholecalciferol (VITAMIN D3) 2000 UNITS TABS Take 2,000 Units by mouth daily.    . clonazePAM (KLONOPIN) 0.5 MG tablet Take 1 or 2 at bedtime as needed for sleep 60 tablet 2  . fluticasone (FLONASE) 50 MCG/ACT nasal spray Place 2 sprays into both nostrils daily. 43 g 3  . Melatonin 5 MG TABS Take 5 mg by mouth at bedtime.    . meloxicam (MOBIC) 15 MG tablet Take 1 tablet (15 mg total) by mouth daily. 90 tablet 3  . ranitidine (ZANTAC) 300 MG tablet Take  1 tablet (300 mg total) by mouth daily. 90 tablet 3  . sertraline (ZOLOFT) 100 MG tablet Take 2 tablets (200 mg total) by mouth daily. 180 tablet 3  . sucralfate (CARAFATE) 1 GM/10ML suspension Take 10 mLs (1 g total) by mouth 4 (four) times daily -  with meals and at  bedtime. 420 mL 0  . valACYclovir (VALTREX) 1000 MG tablet Take 1 tablet (1,000 mg total) by mouth 2 (two) times daily. Take two tabs now and then twelve hours later take two more tabs. 4 tablet 0  . vitamin B-12 (CYANOCOBALAMIN) 500 MCG tablet Take 500 mcg by mouth daily.     No current facility-administered medications on file prior to visit.     Review of Systems:  As per HPI- otherwise negative. No fever or chills No CP or SOB No urinary or bowel symptoms   Physical Examination: Vitals:   12/15/16 1655  BP: 112/82  Pulse: 76  Temp: 98.2 F (36.8 C)  SpO2: 98%   Vitals:   12/15/16 1655  Weight: 180 lb 12.8 oz (82 kg)  Height: 6' (1.829 m)   Body mass index is 24.52 kg/m. Ideal Body Weight: Weight in (lb) to have BMI = 25: 183.9  GEN: WDWN, NAD, Non-toxic, A & O x 3, looks well, normal weight, hearing aids, somewhat HOH HEENT: Atraumatic, Normocephalic. Neck supple. No masses, No LAD. Ears and Nose: No external deformity. CV: RRR, No M/G/R. No JVD. No thrill. No extra heart sounds. PULM: CTA B, no wheezes, crackles, rhonchi. No retractions. No resp. distress. No accessory muscle use. ABD: S, NT, ND EXTR: No c/c/e NEURO Normal gait.  PSYCH: Normally interactive. Conversant. Not depressed or anxious appearing.  Calm demeanor.  Right hip:  Normal ROM, no pain with manipulation and movement of joint.  His tenderness seems to be over the greater trochanter.  No pain with internal or external rotation of hip Normal BLE strength, DTR, SLR  Assessment and Plan: Right hip pain - Plan: DG HIP UNILAT W OR W/O PELVIS 2-3 VIEWS RIGHT  Immunization due - Plan: Flu Vaccine QUAD 36+ mos IM (Fluarix & Fluzone Quad  PF  Chronic low back pain, unspecified back pain laterality, with sciatica presence unspecified - Plan: HYDROcodone-acetaminophen (NORCO/VICODIN) 5-325 MG tablet  Chronic pain syndrome - Plan: LYRICA 100 MG capsule  Chronic fatigue - Plan: methylphenidate (RITALIN) 20 MG tablet  Tremor of both hands - Plan: propranolol (INDERAL) 10 MG tablet  Bilateral hearing loss, unspecified hearing loss type  Primary insomnia - Plan: traZODone (DESYREL) 100 MG tablet  Drug-induced constipation  Here today to follow-up on a few things Flu shot today Refilled medications for his chronic fatigue (ritalin), tremor (inderal), chronic pain (lyrica and hydrocodone) He has noted right hip pain.  His history sounds like arthritis but exam is more suggested of greater trochanteric bursitis.  I am leery of doing a steroid injection as there is very little soft tissue overlying his hip.  Will obtain plain films for now and discuss the next step Also talked about minor but persistent constipation - ?related to pain med use- and how to resolve this  Meds ordered this encounter  Medications  . HYDROcodone-acetaminophen (NORCO/VICODIN) 5-325 MG tablet    Sig: Take 1 tablet by mouth every 8 (eight) hours as needed for moderate pain.    Dispense:  30 tablet    Refill:  0  . LYRICA 100 MG capsule    Sig: Take 2 capsules (200 mg total) by mouth at bedtime.    Dispense:  180 capsule    Refill:  2  . methylphenidate (RITALIN) 20 MG tablet    Sig: Take 1 tablet (20 mg total) by mouth 2 (two) times daily.    Dispense:  180 tablet    Refill:  0  .  propranolol (INDERAL) 10 MG tablet    Sig: Take every 12 hours as needed for tremor    Dispense:  180 tablet    Refill:  3  . traZODone (DESYREL) 100 MG tablet    Sig: Take 1/2 or 1 tablet (50 or 100 mg) at bedtime as needed for sleep    Dispense:  90 tablet    Refill:  3    Signed Lamar Blinks, MD

## 2016-12-15 ENCOUNTER — Ambulatory Visit (INDEPENDENT_AMBULATORY_CARE_PROVIDER_SITE_OTHER): Payer: BLUE CROSS/BLUE SHIELD | Admitting: Family Medicine

## 2016-12-15 ENCOUNTER — Encounter: Payer: Self-pay | Admitting: Family Medicine

## 2016-12-15 ENCOUNTER — Ambulatory Visit (HOSPITAL_BASED_OUTPATIENT_CLINIC_OR_DEPARTMENT_OTHER)
Admission: RE | Admit: 2016-12-15 | Discharge: 2016-12-15 | Disposition: A | Discharge status: Home / Self Care | Payer: BLUE CROSS/BLUE SHIELD | Source: Ambulatory Visit | Attending: Family Medicine | Admitting: Family Medicine

## 2016-12-15 VITALS — BP 112/82 | HR 76 | Temp 98.2°F | Ht 72.0 in | Wt 180.8 lb

## 2016-12-15 DIAGNOSIS — R251 Tremor, unspecified: Secondary | ICD-10-CM | POA: Diagnosis not present

## 2016-12-15 DIAGNOSIS — K5903 Drug induced constipation: Secondary | ICD-10-CM

## 2016-12-15 DIAGNOSIS — H9193 Unspecified hearing loss, bilateral: Secondary | ICD-10-CM

## 2016-12-15 DIAGNOSIS — R5382 Chronic fatigue, unspecified: Secondary | ICD-10-CM | POA: Diagnosis not present

## 2016-12-15 DIAGNOSIS — Z23 Encounter for immunization: Secondary | ICD-10-CM

## 2016-12-15 DIAGNOSIS — M25551 Pain in right hip: Secondary | ICD-10-CM | POA: Diagnosis not present

## 2016-12-15 DIAGNOSIS — F5101 Primary insomnia: Secondary | ICD-10-CM

## 2016-12-15 DIAGNOSIS — G8929 Other chronic pain: Secondary | ICD-10-CM

## 2016-12-15 DIAGNOSIS — M545 Low back pain: Secondary | ICD-10-CM

## 2016-12-15 DIAGNOSIS — G894 Chronic pain syndrome: Secondary | ICD-10-CM

## 2016-12-15 DIAGNOSIS — H9190 Unspecified hearing loss, unspecified ear: Secondary | ICD-10-CM | POA: Insufficient documentation

## 2016-12-15 MED ORDER — LYRICA 100 MG PO CAPS: 200.0000 mg | ORAL_CAPSULE | Freq: Every day | ORAL | 2 refills | Status: DC

## 2016-12-15 MED ORDER — METHYLPHENIDATE HCL 20 MG PO TABS: 20.0000 mg | ORAL_TABLET | Freq: Two times a day (BID) | ORAL | 0 refills | Status: DC

## 2016-12-15 MED ORDER — PROPRANOLOL HCL 10 MG PO TABS: ORAL_TABLET | ORAL | 3 refills | Status: DC

## 2016-12-15 MED ORDER — HYDROCODONE-ACETAMINOPHEN 5-325 MG PO TABS: 1.0000 | ORAL_TABLET | Freq: Three times a day (TID) | ORAL | 0 refills | Status: DC | PRN

## 2016-12-15 MED ORDER — TRAZODONE HCL 100 MG PO TABS: ORAL_TABLET | ORAL | 3 refills | Status: DC

## 2016-12-15 NOTE — Patient Instructions (Addendum)
We will get some plain films of your hip today- I will let you know how these look and we will plan the next step You might use some docusate sodium (colace) once a day to keep your stool more regular.  Once you are working better, we can have you add in some more fiber to your diet  Please go to the imaging suite on the ground floor for your x-rays and I will contact you with your report asap

## 2017-01-07 NOTE — Progress Notes (Addendum)
[place] [facility] [address], [place], [place] [address] [phone]  Date:  01/09/2017   Name:  Patrick [name]   DOB:  December 31, 1950   MRN:  [id number]  PCP:  Darreld Mclean, MD    Chief Complaint: GI Problem (Recurrent) and Hip Pain   History of Present Illness:  Patrick [name] is a 66 y.o. very pleasant male patient who presents with the following:  I saw him on 10/4 for right hip pain Here today with concern of abdominal pain- I saw him for this in August of 17-  He had a CT, and we did refer him to GI.  He had an upper GI per Dr. Ardis Hughs-   He notes that the pain returned sometime in August of this year.  It would sometimes be bad enough that he might need to take hydrocodone He has not vomited, but felt nauseated a few times His bowels are better  When he has the pain it feels like bad indigestion- going outside and burping seems to help some He has tried avoiding his meloxicam, and tried taking his medications on different time schedules but nothing seems to help   The pain is not exertional at all. He cannot pinpoint any exacerbating foods.  Has tried drinking more milk but it did not help  He is taking zanctac still, and has tried increasing his dose but it did not help  He stopped taking the omeprazole as it did not seem to be adding anything We had tried carafate last year but he does not remember if it helped   LFY:[facility] already  [place]: he last filled his hydrocodone in October, 10/18, 21 pills only UDS done in December, contract is UTD   Patient Active Problem List   Diagnosis Date Noted  . Hearing loss 12/15/2016  . Chronic fatigue 10/09/2015  . Generalized anxiety disorder 10/09/2015  . Memory loss 10/09/2015  . Obstructive sleep apnea of adult 02/09/2015  . Benign prostatic hyperplasia with urinary obstruction 11/18/2013  . Acid reflux 11/18/2013  . Degenerative arthritis of lumbar  spine 11/18/2013  . Headache, migraine 11/18/2013  . External hemorrhoids with other complication 51/04/5850  . Restless leg 05/27/2009    Past Medical History:  Diagnosis Date  . Anxiety   . Arthritis   . Basal cell carcinoma   . BPH (benign prostatic hyperplasia)   . Depression   . Dysphagia   . Fibromyalgia   . GERD (gastroesophageal reflux disease)    otc  tums  . Headache    migraines  . Herpes simplex type 1 infection   . History of migraine   . Hypertension    no longer on medication  . Insomnia   . Lower back pain   . Morton's neuroma   . OSA (obstructive sleep apnea)   . Primary snoring   . Recurrent canker sores   . Urinary frequency     Past Surgical History:  Procedure Laterality Date  . EPIDIDYMECTOMY     for spermatocele  . EYE SURGERY Bilateral    lasik eye surgery  . FOOT SURGERY Left   . INGUINAL HERNIA REPAIR Left    01/25/1999  . MOUTH SURGERY    . pancreatic surgery r/t trauma    . SEPTOPLASTY N/A 02/09/2015   Procedure: SEPTOPLASTY;  Surgeon: Rozetta Nunnery, MD;  Location: [facility];  Service: ENT;  Laterality: N/A;  . SKIN  CANCER EXCISION    . TONSILLECTOMY    . TRANSURETHRAL INCISION OF PROSTATE    . TURBINATE REDUCTION Bilateral 02/09/2015   Procedure: BILATERAL TURBINATE REDUCTION;  Surgeon: Rozetta Nunnery, MD;  Location: [facility];  Service: ENT;  Laterality: Bilateral;  . UVULOPALATOPHARYNGOPLASTY N/A 02/09/2015   Procedure: UVULOPALATOPHARYNGOPLASTY (UPPP);  Surgeon: Rozetta Nunnery, MD;  Location: [facility] OR;  Service: ENT;  Laterality: N/A;    Social History  Substance Use Topics  . Smoking status: Former Smoker    Packs/day: 2.00    Years: 30.00    Types: Cigarettes    Quit date: 10/29/2005  . Smokeless tobacco: Never Used  . Alcohol use 0.0 oz/week     Comment: Rare    Family History  Problem Relation Age of Onset  . Coronary artery disease Mother   . Stroke Mother   . Hypertension Father   . Anxiety disorder  Brother   . Skin cancer Brother     No Known Allergies  Medication list has been reviewed and updated.  Current Outpatient Prescriptions on File Prior to Visit  Medication Sig Dispense Refill  . benzonatate (TESSALON) 100 MG capsule Take 1 capsule (100 mg total) by mouth 3 (three) times daily as needed for cough. 21 capsule 0  . betamethasone dipropionate (DIPROLENE) 0.05 % cream Apply topically 2 (two) times daily. Use as needed 30 g 3  . Cholecalciferol (VITAMIN D3) 2000 UNITS TABS Take 2,000 Units by mouth daily.    . clonazePAM (KLONOPIN) 0.5 MG tablet Take 1 or 2 at bedtime as needed for sleep 60 tablet 2  . fluticasone (FLONASE) 50 MCG/ACT nasal spray Place 2 sprays into both nostrils daily. 43 g 3  . LYRICA 100 MG capsule Take 2 capsules (200 mg total) by mouth at bedtime. 180 capsule 2  . Melatonin 5 MG TABS Take 5 mg by mouth at bedtime.    . meloxicam (MOBIC) 15 MG tablet Take 1 tablet (15 mg total) by mouth daily. 90 tablet 3  . methylphenidate (RITALIN) 20 MG tablet Take 1 tablet (20 mg total) by mouth 2 (two) times daily. 180 tablet 0  . propranolol (INDERAL) 10 MG tablet Take every 12 hours as needed for tremor 180 tablet 3  . ranitidine (ZANTAC) 300 MG tablet Take 1 tablet (300 mg total) by mouth daily. 90 tablet 3  . sertraline (ZOLOFT) 100 MG tablet Take 2 tablets (200 mg total) by mouth daily. 180 tablet 3  . traZODone (DESYREL) 100 MG tablet Take 1/2 or 1 tablet (50 or 100 mg) at bedtime as needed for sleep 90 tablet 3  . valACYclovir (VALTREX) 1000 MG tablet Take 1 tablet (1,000 mg total) by mouth 2 (two) times daily. Take two tabs now and then twelve hours later take two more tabs. 4 tablet 0  . vitamin B-12 (CYANOCOBALAMIN) 500 MCG tablet Take 500 mcg by mouth daily.     No current facility-administered medications on file prior to visit.     Review of Systems:  As per HPI- otherwise negative.   Physical Examination: Vitals:   01/09/17 1113  BP: 122/83   Pulse: 64  Temp: 98 F (36.7 C)  SpO2: 97%   Vitals:   01/09/17 1113  Weight: 183 lb (83 kg)  Height: 5\' 9"  (1.753 m)   Body mass index is 27.02 kg/m. Ideal Body Weight: Weight in (lb) to have BMI = 25: 168.9  GEN: WDWN, NAD, Non-toxic, A & O x 3, normal  weight, looks well HEENT: Atraumatic, Normocephalic. Neck supple. No masses, No LAD.  Bilateral TM wnl, oropharynx normal.  PEERL,EOMI.   Ears and Nose: No external deformity. CV: RRR, No M/G/R. No JVD. No thrill. No extra heart sounds. PULM: CTA B, no wheezes, crackles, rhonchi. No retractions. No resp. distress. No accessory muscle use. ABD: S, ND, +BS. No rebound. No HSM. He notes minimal TTP under the left ribs EXTR: No c/c/e NEURO Normal gait.  PSYCH: Normally interactive. Conversant. Not depressed or anxious appearing.  Calm demeanor.    Assessment and Plan: Epigastric pain - Plan: Dexlansoprazole (DEXILANT) 30 MG capsule, Comprehensive metabolic panel, Lipase, sucralfate (CARAFATE) 1 GM/10ML suspension  Chronic low back pain, unspecified back pain laterality, with sciatica presence unspecified - Plan: HYDROcodone-acetaminophen (NORCO/VICODIN) 5-325 MG tablet  Here today with persistent/ recurrent epigastric pain He has had a CT of his abd and chest, and saw GI/ had endoscopy. Negative H pylori last year Will try adding dexilant, and carafate as well. Labs pending He has a sports doctor with [facility]- he plans to see them about his chronic back pain and also newer right hip pain. I will refill his hydrocodone today  Will plan further follow- up pending labs.   Signed Lamar Blinks, MD  Message to pt with labs  Results for orders placed or performed in visit on 01/09/17  Comprehensive metabolic panel  Result Value Ref Range   Sodium 139 135 - 145 mEq/L   Potassium 4.1 3.5 - 5.1 mEq/L   Chloride 103 96 - 112 mEq/L   CO2 31 19 - 32 mEq/L   Glucose, Bld 101 (H) 70 - 99 mg/dL   BUN 17 6 - 23 mg/dL   Creatinine, Ser  0.93 0.40 - 1.50 mg/dL   Total Bilirubin 0.7 0.2 - 1.2 mg/dL   Alkaline Phosphatase 59 39 - 117 U/L   AST 20 0 - 37 U/L   ALT 15 0 - 53 U/L   Total Protein 6.9 6.0 - 8.3 g/dL   Albumin 4.4 3.5 - 5.2 g/dL   Calcium 9.6 8.4 - 10.5 mg/dL   GFR 86.88 >60.00 mL/min  Lipase  Result Value Ref Range   Lipase 32.0 11.0 - 59.0 U/L   Message to pt

## 2017-01-09 ENCOUNTER — Encounter: Payer: Self-pay | Admitting: Family Medicine

## 2017-01-09 ENCOUNTER — Ambulatory Visit (INDEPENDENT_AMBULATORY_CARE_PROVIDER_SITE_OTHER): Payer: BLUE CROSS/BLUE SHIELD | Admitting: Family Medicine

## 2017-01-09 VITALS — BP 122/83 | HR 64 | Temp 98.0°F | Ht 69.0 in | Wt 183.0 lb

## 2017-01-09 DIAGNOSIS — M545 Low back pain: Secondary | ICD-10-CM | POA: Diagnosis not present

## 2017-01-09 DIAGNOSIS — G8929 Other chronic pain: Secondary | ICD-10-CM | POA: Diagnosis not present

## 2017-01-09 DIAGNOSIS — R1013 Epigastric pain: Secondary | ICD-10-CM | POA: Diagnosis not present

## 2017-01-09 LAB — COMPREHENSIVE METABOLIC PANEL
ALBUMIN: 4.4 g/dL (ref 3.5–5.2)
ALK PHOS: 59 U/L (ref 39–117)
ALT: 15 U/L (ref 0–53)
AST: 20 U/L (ref 0–37)
BILIRUBIN TOTAL: 0.7 mg/dL (ref 0.2–1.2)
BUN: 17 mg/dL (ref 6–23)
CHLORIDE: 103 meq/L (ref 96–112)
CO2: 31 mEq/L (ref 19–32)
CREATININE: 0.93 mg/dL (ref 0.40–1.50)
Calcium: 9.6 mg/dL (ref 8.4–10.5)
GFR: 86.88 mL/min (ref >60.00)
Glucose, Bld: 101 mg/dL — ABNORMAL HIGH (ref 70–99)
Potassium: 4.1 mEq/L (ref 3.5–5.1)
SODIUM: 139 meq/L (ref 135–145)
Total Protein: 6.9 g/dL (ref 6.0–8.3)

## 2017-01-09 LAB — LIPASE: LIPASE: 32 U/L (ref 11.0–59.0)

## 2017-01-09 MED ORDER — HYDROCODONE-ACETAMINOPHEN 5-325 MG PO TABS
1.0000 | ORAL_TABLET | Freq: Three times a day (TID) | ORAL | 0 refills | Status: DC | PRN
Start: 2017-01-09 — End: 2017-02-16

## 2017-01-09 MED ORDER — SUCRALFATE 1 GM/10ML PO SUSP: 1.0000 g | Freq: Three times a day (TID) | ORAL | 0 refills | Status: DC

## 2017-01-09 MED ORDER — DEXLANSOPRAZOLE 30 MG PO CPDR: 30.0000 mg | DELAYED_RELEASE_CAPSULE | Freq: Every day | ORAL | 5 refills | Status: DC

## 2017-01-09 NOTE — Patient Instructions (Signed)
Please do schedule with your sports doc for a possible hip injection  For your stomach pain- we are going to check labs I refilled the carafate- you can use this as needed prior to meals and bedtime Continue zantac- we are going to try adding Dexilant (if the coupon works)- it is does not please let me know   Will plan further follow- up pending labs.

## 2017-01-10 DIAGNOSIS — M47816 Spondylosis without myelopathy or radiculopathy, lumbar region: Secondary | ICD-10-CM | POA: Diagnosis not present

## 2017-01-10 DIAGNOSIS — M7581 Other shoulder lesions, right shoulder: Secondary | ICD-10-CM | POA: Insufficient documentation

## 2017-01-10 DIAGNOSIS — M7521 Bicipital tendinitis, right shoulder: Secondary | ICD-10-CM | POA: Diagnosis not present

## 2017-01-10 DIAGNOSIS — M7061 Trochanteric bursitis, right hip: Secondary | ICD-10-CM | POA: Diagnosis not present

## 2017-01-10 DIAGNOSIS — M5136 Other intervertebral disc degeneration, lumbar region: Secondary | ICD-10-CM | POA: Diagnosis not present

## 2017-01-23 ENCOUNTER — Encounter: Payer: Self-pay | Admitting: Family Medicine

## 2017-01-23 ENCOUNTER — Other Ambulatory Visit: Payer: Self-pay | Admitting: Family Medicine

## 2017-02-15 NOTE — Progress Notes (Signed)
[facility] at [facility] [address], [place], [place] [address] [phone]  Date:  02/16/2017   Name:  ZOHAIB [name]   DOB:  1950/12/07   MRN:  [id number]  PCP:  Darreld Mclean, MD    Chief Complaint: Cough (c/o prod cough with green mucus x 1 month. )   History of Present Illness:  Patrick [name] is a 66 y.o. 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 There was an issue with mice in his office  He has not checked his temp,but has not slept well, feeling tired. He feels a bit achy  His nose is runny. He is coughing up green material The cough makes him feel like he is going to vomit [facility] [address]  He is really not taking any medication for this right now- would like some cough syrup as what he is taking is not really working that well  [place]: hydrocodone filled on 10/29 He uses this for his back pain.  However can also use for cough- would prefer he do this as opposed to giving him a second rx for [facility] signed UDS about a year ago- defer today as pt is ill Patient Active Problem List   Diagnosis Date Noted  . Hearing loss 12/15/2016  . Chronic fatigue 10/09/2015  . Generalized anxiety disorder 10/09/2015  . Memory loss 10/09/2015  . Obstructive sleep apnea of adult 02/09/2015  . Benign prostatic hyperplasia with urinary obstruction 11/18/2013  . Acid reflux 11/18/2013  . Degenerative arthritis of lumbar spine 11/18/2013  . Headache, migraine 11/18/2013  . External hemorrhoids with other  complication 26/83/4196  . Restless leg 05/27/2009    Past Medical History:  Diagnosis Date  . Anxiety   . Arthritis   . Basal cell carcinoma   . BPH (benign prostatic hyperplasia)   . Depression   . Dysphagia   . Fibromyalgia   . GERD (gastroesophageal reflux disease)    otc  tums  . Headache    migraines  . Herpes simplex type 1 infection   . History of migraine   . Hypertension    no longer on medication  . Insomnia   . Lower back pain   . Morton's neuroma   . OSA (obstructive sleep apnea)   . Primary snoring   . Recurrent canker sores   . Urinary frequency     Past Surgical History:  Procedure Laterality Date  . EPIDIDYMECTOMY     for spermatocele  . EYE SURGERY Bilateral    lasik eye surgery  . FOOT SURGERY Left   . INGUINAL HERNIA REPAIR Left    01/25/1999  . MOUTH SURGERY    . pancreatic surgery r/t trauma    . SEPTOPLASTY N/A 02/09/2015   Procedure: SEPTOPLASTY;  Surgeon: Rozetta Nunnery, MD;  Location: [facility];  Service: ENT;  Laterality: N/A;  . SKIN CANCER EXCISION    . TONSILLECTOMY    . TRANSURETHRAL INCISION OF PROSTATE    .  TURBINATE REDUCTION Bilateral 02/09/2015   Procedure: BILATERAL TURBINATE REDUCTION;  Surgeon: Rozetta Nunnery, MD;  Location: [facility];  Service: ENT;  Laterality: Bilateral;  . UVULOPALATOPHARYNGOPLASTY N/A 02/09/2015   Procedure: UVULOPALATOPHARYNGOPLASTY (UPPP);  Surgeon: Rozetta Nunnery, MD;  Location: [facility];  Service: ENT;  Laterality: N/A;    Social History   Tobacco Use  . Smoking status: Former Smoker    Packs/day: 2.00    Years: 30.00    Pack years: 60.00    Types: Cigarettes    Last attempt to quit: 10/29/2005    Years since quitting: 11.3  . Smokeless tobacco: Never Used  Substance Use Topics  . Alcohol use: Yes    Alcohol/week: 0.0 oz    Comment: Rare  . Drug use: No    Family History  Problem Relation Age of Onset  . Coronary artery disease Mother   . Stroke Mother   . Hypertension  Father   . Anxiety disorder Brother   . Skin cancer Brother     No Known Allergies  Medication list has been reviewed and updated.  Current Outpatient Medications on File Prior to Visit  Medication Sig Dispense Refill  . benzonatate (TESSALON) 100 MG capsule Take 1 capsule (100 mg total) by mouth 3 (three) times daily as needed for cough. 21 capsule 0  . betamethasone dipropionate (DIPROLENE) 0.05 % cream Apply topically 2 (two) times daily. Use as needed 30 g 3  . Cholecalciferol (VITAMIN D3) 2000 UNITS TABS Take 2,000 Units by mouth daily.    . clonazePAM (KLONOPIN) 0.5 MG tablet Take 1 or 2 at bedtime as needed for sleep 60 tablet 2  . Dexlansoprazole (DEXILANT) 30 MG capsule Take 1 capsule (30 mg total) by mouth daily. 30 capsule 5  . fluticasone (FLONASE) 50 MCG/ACT nasal spray Place 2 sprays into both nostrils daily. 43 g 3  . HYDROcodone-acetaminophen (NORCO/VICODIN) 5-325 MG tablet Take 1 tablet by mouth every 8 (eight) hours as needed for moderate pain. 30 tablet 0  . LYRICA 100 MG capsule Take 2 capsules (200 mg total) by mouth at bedtime. 180 capsule 2  . Melatonin 5 MG TABS Take 5 mg by mouth at bedtime.    . meloxicam (MOBIC) 15 MG tablet Take 1 tablet (15 mg total) by mouth daily. 90 tablet 3  . methylphenidate (RITALIN) 20 MG tablet Take 1 tablet (20 mg total) by mouth 2 (two) times daily. 180 tablet 0  . propranolol (INDERAL) 10 MG tablet Take every 12 hours as needed for tremor 180 tablet 3  . ranitidine (ZANTAC) 300 MG tablet Take 1 tablet (300 mg total) by mouth daily. 90 tablet 3  . sertraline (ZOLOFT) 100 MG tablet Take 2 tablets (200 mg total) by mouth daily. 180 tablet 3  . sucralfate (CARAFATE) 1 GM/10ML suspension Take 10 mLs (1 g total) by mouth 4 (four) times daily -  with meals and at bedtime. 420 mL 0  . traZODone (DESYREL) 100 MG tablet Take 1/2 or 1 tablet (50 or 100 mg) at bedtime as needed for sleep 90 tablet 3  . valACYclovir (VALTREX) 1000 MG tablet Take  1 tablet (1,000 mg total) by mouth 2 (two) times daily. Take two tabs now and then twelve hours later take two more tabs. 4 tablet 0  . vitamin B-12 (CYANOCOBALAMIN) 500 MCG tablet Take 500 mcg by mouth daily.     No current facility-administered medications on file prior to visit.     Review of  Systems:  As per HPI- otherwise negative.   Physical Examination: There were no vitals filed for this visit. Vitals:   02/16/17 1029  Weight: 183 lb 6.4 oz (83.2 kg)   Body mass index is 27.08 kg/m. Ideal Body Weight:    GEN: WDWN, NAD, Non-toxic, A & O x 3, looks well but not quite his normal self HEENT: Atraumatic, Normocephalic. Neck supple. No masses, No LAD.  Bilateral TM wnl, oropharynx normal.  PEERL,EOMI.   Ears and Nose: No external deformity. CV: RRR, No M/G/R. No JVD. No thrill. No extra heart sounds. PULM: CTA B, no wheezes, crackles, rhonchi. No retractions. No resp. distress. No accessory muscle use. ABD: S, NT, ND EXTR: No c/c/e NEURO Normal gait.  PSYCH: Normally interactive. Conversant. Not depressed or anxious appearing.  Calm demeanor.   Dg Chest 2 View  Result Date: 02/16/2017 CLINICAL DATA:  Cough for the past month associated with fever. EXAM: CHEST  2 VIEW COMPARISON:  CT scan of the chest of Jul 29, 2016 and PA and lateral chest x-ray of September 26, 2015 FINDINGS: The lungs are well-expanded and clear. The heart and pulmonary vascularity are normal. The mediastinum is normal in width. The trachea midline. There is no pleural effusion. The bony thorax exhibits no acute abnormality. IMPRESSION: There is no pneumonia nor other acute cardiopulmonary abnormality. Electronically Signed   By: David  Martinique M.D.   On: 02/16/2017 10:56   Results for orders placed or performed in visit on 02/16/17  POCT Influenza A/B  Result Value Ref Range   Influenza A, POC Negative Negative   Influenza B, POC Negative Negative     Assessment and Plan: Cough - Plan: POCT Influenza A/B,  DG Chest 2 View, benzonatate (TESSALON) 100 MG capsule  Low grade fever - Plan: POCT Influenza A/B, DG Chest 2 View  Malaise - Plan: POCT Influenza A/B, DG Chest 2 View  Chronic low back pain, unspecified back pain laterality, with sciatica presence unspecified - Plan: HYDROcodone-acetaminophen (NORCO/VICODIN) 5-325 MG tablet  Bronchitis - Plan: cefdinir (OMNICEF) 300 MG capsule  Here today with illness- treat for bronchitis/ sinusitis as he has fever and negative flu Tessalon perles omnicef rx He wonders about cough syrup for night, but also is having more trouble with back pain.  Will refill his hydrocodone to use as needed for both sx.  Advised that if he takes hydrocodone he needs to reduce his dose of klonopin that he takes each night He will let me know if not feeling better in the next few days- Sooner if worse.     Signed Lamar Blinks, MD

## 2017-02-16 ENCOUNTER — Ambulatory Visit: Payer: BLUE CROSS/BLUE SHIELD | Admitting: Family Medicine

## 2017-02-16 ENCOUNTER — Ambulatory Visit (HOSPITAL_BASED_OUTPATIENT_CLINIC_OR_DEPARTMENT_OTHER)
Admission: RE | Admit: 2017-02-16 | Discharge: 2017-02-16 | Disposition: A | Discharge status: Home / Self Care | Payer: BLUE CROSS/BLUE SHIELD | Source: Ambulatory Visit | Attending: Family Medicine | Admitting: Family Medicine

## 2017-02-16 ENCOUNTER — Encounter: Payer: Self-pay | Admitting: Family Medicine

## 2017-02-16 VITALS — BP 140/90 | HR 99 | Temp 100.1°F | Ht 69.0 in | Wt 183.4 lb

## 2017-02-16 DIAGNOSIS — R059 Cough, unspecified: Secondary | ICD-10-CM

## 2017-02-16 DIAGNOSIS — R509 Fever, unspecified: Secondary | ICD-10-CM | POA: Diagnosis not present

## 2017-02-16 DIAGNOSIS — J40 Bronchitis, not specified as acute or chronic: Secondary | ICD-10-CM

## 2017-02-16 DIAGNOSIS — R05 Cough: Secondary | ICD-10-CM | POA: Diagnosis not present

## 2017-02-16 DIAGNOSIS — G8929 Other chronic pain: Secondary | ICD-10-CM | POA: Diagnosis not present

## 2017-02-16 DIAGNOSIS — M545 Low back pain: Secondary | ICD-10-CM | POA: Diagnosis not present

## 2017-02-16 DIAGNOSIS — R5381 Other malaise: Secondary | ICD-10-CM | POA: Diagnosis not present

## 2017-02-16 LAB — POCT INFLUENZA A/B
INFLUENZA A, POC: NEGATIVE
Influenza B, POC: NEGATIVE

## 2017-02-16 MED ORDER — CEFDINIR 300 MG PO CAPS: 300.0000 mg | ORAL_CAPSULE | Freq: Two times a day (BID) | ORAL | 0 refills | Status: DC

## 2017-02-16 MED ORDER — HYDROCODONE-ACETAMINOPHEN 5-325 MG PO TABS: 1.0000 | ORAL_TABLET | Freq: Three times a day (TID) | ORAL | 0 refills | Status: DC | PRN

## 2017-02-16 MED ORDER — BENZONATATE 100 MG PO CAPS: 100.0000 mg | ORAL_CAPSULE | Freq: Three times a day (TID) | ORAL | 0 refills | Status: DC | PRN

## 2017-02-16 NOTE — Patient Instructions (Addendum)
I am sorry that you are not feeling well!  Please use the omnicef antibiotic as directed for bronchitis and possible sinus infection You can use the tessalon perles as needed for cough I refilled your hydrocodone today- this is also the active ingredient in the most common rx cough syrup.  You can use the hydrocodone for cough and pain at bedtime as well Please let me know if you are not feeling better in the next 2-3 days- Sooner if worse.    Please be cautious if using both hydrocodone and klonopin- if you use these in combination please decrease your klonopin dose

## 2017-03-03 ENCOUNTER — Telehealth: Payer: Self-pay | Admitting: Family Medicine

## 2017-03-03 NOTE — Telephone Encounter (Signed)
Rx for RITALIN and NORCO/VICODEN dated 07/11/2016 were not picked up. Document was shredded.

## 2017-03-20 ENCOUNTER — Other Ambulatory Visit: Payer: Self-pay | Admitting: Family Medicine

## 2017-03-20 ENCOUNTER — Encounter: Payer: Self-pay | Admitting: Family Medicine

## 2017-03-20 DIAGNOSIS — K219 Gastro-esophageal reflux disease without esophagitis: Secondary | ICD-10-CM

## 2017-03-24 MED ORDER — BUTALBITAL-APAP-CAFF-COD 50-300-40-30 MG PO CAPS: ORAL_CAPSULE | ORAL | 0 refills | Status: DC

## 2017-03-24 NOTE — Addendum Note (Signed)
Addended by: Lamar Blinks C on: 03/24/2017 12:48 PM   Modules accepted: Orders

## 2017-03-28 ENCOUNTER — Encounter: Payer: Self-pay | Admitting: Family Medicine

## 2017-03-31 ENCOUNTER — Encounter: Payer: Self-pay | Admitting: Family Medicine

## 2017-04-13 ENCOUNTER — Other Ambulatory Visit: Payer: Self-pay | Admitting: Emergency Medicine

## 2017-04-13 MED ORDER — CLONAZEPAM 0.5 MG PO TABS: ORAL_TABLET | ORAL | 3 refills | Status: DC

## 2017-04-13 NOTE — Telephone Encounter (Signed)
Received refill request for clonazepam tab. Last office visit 12//6/18 and last refill 12/12/16. Is it ok to refill? Please advise.

## 2017-05-03 ENCOUNTER — Ambulatory Visit: Payer: Self-pay | Admitting: Medical

## 2017-05-03 ENCOUNTER — Ambulatory Visit: Payer: BLUE CROSS/BLUE SHIELD | Admitting: Family Medicine

## 2017-05-03 ENCOUNTER — Encounter: Payer: Self-pay | Admitting: Family Medicine

## 2017-05-03 VITALS — BP 120/80 | HR 90 | Temp 98.3°F | Ht 69.0 in | Wt 188.2 lb

## 2017-05-03 DIAGNOSIS — R0981 Nasal congestion: Secondary | ICD-10-CM

## 2017-05-03 DIAGNOSIS — R52 Pain, unspecified: Secondary | ICD-10-CM

## 2017-05-03 LAB — POCT INFLUENZA A/B
INFLUENZA A, POC: NEGATIVE
Influenza B, POC: NEGATIVE

## 2017-05-03 MED ORDER — AMOXICILLIN 500 MG PO CAPS: 1000.0000 mg | ORAL_CAPSULE | Freq: Two times a day (BID) | ORAL | 0 refills | Status: DC

## 2017-05-03 NOTE — Progress Notes (Signed)
[facility] at [facility] [address] [place], [place] [address] [phone]  Date:  05/03/2017   Name:  Patrick [name]   DOB:  1950/11/14   MRN:  [id number]  PCP:  Patrick Mclean, MD    Chief Complaint: Nasal Congestion (c/o nasal congestion, postnasal drip.)   History of Present Illness:  Patrick [name] is a 67 y.o. very pleasant male patient who presents with the following:  History of anxiety, chronic fatigue, OSA, FBM He has messaged me this am due to concern of illness, he is traveling to [place] for his work soon and was concerned about being sick while out of the country.  It is not easy for him to see a doctor while in [place]  He noted onset of symptoms yesterday- chills, no fever however No cough He has sinus pressure and congestion He blew some dark yellow and green from his nose this am His sx are mostly in his nose and sinuses at this time   No stomach symptoms He has noted a little pain on his right hip bone for a couple of days- he is not aware of any injury,but he is walking more for exercise and wonders if he strained a muscle He has been exposed to colds at home    Patient Active Problem List   Diagnosis Date Noted  . Hearing loss 12/15/2016  . Chronic fatigue 10/09/2015  . Generalized anxiety disorder 10/09/2015  . Memory loss 10/09/2015  . Obstructive sleep apnea of adult 02/09/2015  . Benign prostatic hyperplasia with urinary obstruction 11/18/2013  . Acid reflux 11/18/2013  . Degenerative arthritis of lumbar spine 11/18/2013  . Headache, migraine 11/18/2013  . External hemorrhoids with other complication 73/22/0254  . Restless leg 05/27/2009    Past Medical History:  Diagnosis Date  . Anxiety   . Arthritis   . Basal cell carcinoma   . BPH (benign prostatic hyperplasia)   . Depression   . Dysphagia   . Fibromyalgia   . GERD (gastroesophageal reflux disease)    otc  tums  .  Headache    migraines  . Herpes simplex type 1 infection   . History of migraine   . Hypertension    no longer on medication  . Insomnia   . Lower back pain   . Morton's neuroma   . OSA (obstructive sleep apnea)   . Primary snoring   . Recurrent canker sores   . Urinary frequency     Past Surgical History:  Procedure Laterality Date  . EPIDIDYMECTOMY     for spermatocele  . EYE SURGERY Bilateral    lasik eye surgery  . FOOT SURGERY Left   . INGUINAL HERNIA REPAIR Left    01/25/1999  . MOUTH SURGERY    . pancreatic surgery r/t trauma    . SEPTOPLASTY N/A 02/09/2015   Procedure: SEPTOPLASTY;  Surgeon: Rozetta Nunnery, MD;  Location: [place];  Service: ENT;  Laterality: N/A;  . SKIN CANCER EXCISION    . TONSILLECTOMY    . TRANSURETHRAL INCISION OF PROSTATE    . TURBINATE REDUCTION Bilateral 02/09/2015   Procedure: BILATERAL TURBINATE REDUCTION;  Surgeon: Rozetta Nunnery, MD;  Location: [place];  Service: ENT;  Laterality: Bilateral;  . UVULOPALATOPHARYNGOPLASTY N/A 02/09/2015   Procedure: UVULOPALATOPHARYNGOPLASTY (UPPP);  Surgeon: Rozetta Nunnery, MD;  Location: [place];  Service: ENT;  Laterality: N/A;    Social History  Tobacco Use  . Smoking status: Former Smoker    Packs/day: 2.00    Years: 30.00    Pack years: 60.00    Types: Cigarettes    Last attempt to quit: 10/29/2005    Years since quitting: 11.5  . Smokeless tobacco: Never Used  Substance Use Topics  . Alcohol use: Yes    Alcohol/week: 0.0 oz    Comment: Rare  . Drug use: No    Family History  Problem Relation Age of Onset  . Coronary artery disease Mother   . Stroke Mother   . Hypertension Father   . Anxiety disorder Brother   . Skin cancer Brother     No Known Allergies  Medication list has been reviewed and updated.  Current Outpatient Medications on File Prior to Visit  Medication Sig Dispense Refill  . benzonatate (TESSALON) 100 MG capsule Take 1 capsule (100 mg total) by  mouth 3 (three) times daily as needed for cough. 60 capsule 0  . betamethasone dipropionate (DIPROLENE) 0.05 % cream Apply topically 2 (two) times daily. Use as needed 30 g 3  . Butalbital-APAP-Caff-Cod 50-300-40-30 MG CAPS Take 1-2 caps every 6 hours as needed for headache.  Max 6/24 hours 30 capsule 0  . cefdinir (OMNICEF) 300 MG capsule Take 1 capsule (300 mg total) by mouth 2 (two) times daily. 20 capsule 0  . Cholecalciferol (VITAMIN D3) 2000 UNITS TABS Take 2,000 Units by mouth daily.    . clonazePAM (KLONOPIN) 0.5 MG tablet Take 1 or 2 at bedtime as needed for sleep 60 tablet 3  . Dexlansoprazole (DEXILANT) 30 MG capsule Take 1 capsule (30 mg total) by mouth daily. 30 capsule 5  . fluticasone (FLONASE) 50 MCG/ACT nasal spray Place 2 sprays into both nostrils daily. 43 g 3  . HYDROcodone-acetaminophen (NORCO/VICODIN) 5-325 MG tablet Take 1 tablet by mouth every 8 (eight) hours as needed for moderate pain. 40 tablet 0  . LYRICA 100 MG capsule Take 2 capsules (200 mg total) by mouth at bedtime. 180 capsule 2  . Melatonin 5 MG TABS Take 5 mg by mouth at bedtime.    . meloxicam (MOBIC) 15 MG tablet Take 1 tablet (15 mg total) by mouth daily. 90 tablet 3  . methylphenidate (RITALIN) 20 MG tablet Take 1 tablet (20 mg total) by mouth 2 (two) times daily. 180 tablet 0  . propranolol (INDERAL) 10 MG tablet Take every 12 hours as needed for tremor 180 tablet 3  . ranitidine (ZANTAC) 300 MG tablet TAKE 1 TABLET DAILY 90 tablet 3  . sertraline (ZOLOFT) 100 MG tablet Take 2 tablets (200 mg total) by mouth daily. 180 tablet 3  . sucralfate (CARAFATE) 1 GM/10ML suspension Take 10 mLs (1 g total) by mouth 4 (four) times daily -  with meals and at bedtime. 420 mL 0  . traZODone (DESYREL) 100 MG tablet Take 1/2 or 1 tablet (50 or 100 mg) at bedtime as needed for sleep 90 tablet 3  . valACYclovir (VALTREX) 1000 MG tablet Take 1 tablet (1,000 mg total) by mouth 2 (two) times daily. Take two tabs now and then  twelve hours later take two more tabs. 4 tablet 0  . vitamin B-12 (CYANOCOBALAMIN) 500 MCG tablet Take 500 mcg by mouth daily.     No current facility-administered medications on file prior to visit.     Review of Systems:  As per HPI- otherwise negative.   Physical Examination: Vitals:   05/03/17 1354  BP: 120/80  Pulse: (!) 109  Temp: 98.3 F (36.8 C)  SpO2: 98%   Vitals:   05/03/17 1354  Weight: 188 lb 3.2 oz (85.4 kg)  Height: 5\' 9"  (1.753 m)   Body mass index is 27.79 kg/m. Ideal Body Weight: Weight in (lb) to have BMI = 25: 168.9  GEN: WDWN, NAD, Non-toxic, A & O x 3, looks well  HEENT: Atraumatic, Normocephalic. Neck supple. No masses, No LAD.  Bilateral TM wnl, oropharynx normal.  PEERL,EOMI.   Ears and Nose: No external deformity. CV: RRR, No M/G/R. No JVD. No thrill. No extra heart sounds. PULM: CTA B, no wheezes, crackles, rhonchi. No retractions. No resp. distress. No accessory muscle use. EXTR: No c/c/e NEURO Normal gait.  PSYCH: Normally interactive. Conversant. Not depressed or anxious appearing.  Calm demeanor.    Results for orders placed or performed in visit on 05/03/17  POCT Influenza A/B  Result Value Ref Range   Influenza A, POC Negative Negative   Influenza B, POC Negative Negative    Assessment and Plan: Body aches - Plan: POCT Influenza A/B  Sinus congestion - Plan: amoxicillin (AMOXIL) 500 MG capsule  Here today with illness- he is concerned about being sick while out of town Flu is negative Gave him an rx for amox to hold and use if he continues to have sinus sx over the next several days He will contact me on mychart if he needs anything or if not doing ok   Signed Lamar Blinks, MD

## 2017-05-03 NOTE — Patient Instructions (Signed)
Your flu test is negative- we will give you an rx for amoxicillin to hold and use if you continue to have any sinus symptoms while you are on your trip. Let me know if you need anything

## 2017-05-05 ENCOUNTER — Ambulatory Visit: Payer: Self-pay

## 2017-05-05 ENCOUNTER — Encounter: Payer: Self-pay | Admitting: Family Medicine

## 2017-05-05 NOTE — Telephone Encounter (Signed)
Pt called because he is flying to [place] tomorrow and is having congestion; he was also seen by Dr Lorelei Pont on 05/03/17, per Dr Lorelei Pont, "Gave him an rx for amox to hold and use if he continues to have sinus sx over the next several days",pt states that he has not started antibiotic but will;  pt also given home care advice per nurse protocol; pt will also use e-chart for direct conversation with provider; will also route to office for further direction; the pt can be contacted at [phone].  Reason for Disposition . [1] Sinus congestion as part of a cold AND [2] present < 10 days  Protocols used: SINUS PAIN OR CONGESTION-A-AH

## 2017-05-05 NOTE — Telephone Encounter (Signed)
FYI

## 2017-05-15 DIAGNOSIS — N320 Bladder-neck obstruction: Secondary | ICD-10-CM | POA: Diagnosis not present

## 2017-05-15 DIAGNOSIS — R350 Frequency of micturition: Secondary | ICD-10-CM | POA: Diagnosis not present

## 2017-06-13 DIAGNOSIS — M75101 Unspecified rotator cuff tear or rupture of right shoulder, not specified as traumatic: Secondary | ICD-10-CM | POA: Diagnosis not present

## 2017-06-13 DIAGNOSIS — M25511 Pain in right shoulder: Secondary | ICD-10-CM | POA: Diagnosis not present

## 2017-06-22 DIAGNOSIS — M7581 Other shoulder lesions, right shoulder: Secondary | ICD-10-CM | POA: Diagnosis not present

## 2017-07-11 DIAGNOSIS — L570 Actinic keratosis: Secondary | ICD-10-CM | POA: Diagnosis not present

## 2017-07-11 DIAGNOSIS — D2239 Melanocytic nevi of other parts of face: Secondary | ICD-10-CM | POA: Diagnosis not present

## 2017-07-11 DIAGNOSIS — N320 Bladder-neck obstruction: Secondary | ICD-10-CM | POA: Diagnosis not present

## 2017-07-11 DIAGNOSIS — R350 Frequency of micturition: Secondary | ICD-10-CM | POA: Diagnosis not present

## 2017-07-11 DIAGNOSIS — D225 Melanocytic nevi of trunk: Secondary | ICD-10-CM | POA: Diagnosis not present

## 2017-07-11 DIAGNOSIS — L821 Other seborrheic keratosis: Secondary | ICD-10-CM | POA: Diagnosis not present

## 2017-07-11 DIAGNOSIS — R3915 Urgency of urination: Secondary | ICD-10-CM | POA: Diagnosis not present

## 2017-08-02 ENCOUNTER — Other Ambulatory Visit: Payer: Self-pay | Admitting: Family Medicine

## 2017-08-02 DIAGNOSIS — G894 Chronic pain syndrome: Secondary | ICD-10-CM

## 2017-08-04 ENCOUNTER — Telehealth: Payer: Self-pay

## 2017-08-04 MED ORDER — CLONAZEPAM 0.5 MG PO TABS: ORAL_TABLET | ORAL | 1 refills | Status: DC

## 2017-08-04 NOTE — Telephone Encounter (Signed)
Requesting:Clonazepam 90day supply Contract:none-needs contract UDS:02/19/16 needs updated UDS Last Visit:05/03/17 Next Visit:none Last Refill:04/13/17 3 refills  Please Advise

## 2017-08-04 NOTE — Addendum Note (Signed)
Addended by: Lamar Blinks C on: 08/04/2017 01:52 PM   Modules accepted: Orders

## 2017-08-15 ENCOUNTER — Encounter: Payer: Self-pay | Admitting: Family Medicine

## 2017-09-01 ENCOUNTER — Encounter: Payer: Self-pay | Admitting: Family Medicine

## 2017-09-01 DIAGNOSIS — G894 Chronic pain syndrome: Secondary | ICD-10-CM

## 2017-09-01 MED ORDER — LYRICA 100 MG PO CAPS: 200.0000 mg | ORAL_CAPSULE | Freq: Every day | ORAL | 3 refills | Status: DC

## 2017-09-20 ENCOUNTER — Encounter: Payer: Self-pay | Admitting: Family Medicine

## 2017-10-07 NOTE — Progress Notes (Signed)
[place] at [facility] [address], [place], [place] [address] [phone]  Date:  10/09/2017   Name:  Patrick [name]   DOB:  [birth year]   MRN:  [id number]  PCP:  Darreld Mclean, MD    Chief Complaint: Annual Exam (dry cough X several months, concern about joint pain)   History of Present Illness:  Patrick [name] is a 67 y.o. very pleasant male patient who presents with the following:  Following up today/ mychart physical. He has concern about hip pain and cough per his notes  History of hearing loss, anxiety, OSA, chronic fatigue, FMB They did go to the beach for a few days but he has mostly been working this summer.   Due for lung cancer screening CT now, and also complete labs  Last seen here by myself in February when he was concerned about illness with an upcoming work trip to [place]. He works for [employer]. This trip went ok   Could also do pneumovax today although a few days shy of 10 b-day   He has noted a lot of joint pains recently- esp his hips and some other joints as well His right hip has been pain for for a year or so- we did a film of his hip last year that was benign as follows:  EXAM: DG HIP (WITH OR WITHOUT PELVIS) 2-3V RIGHT OMPARISON:  None. FINDINGS: No fracture.  No bone lesion. Hip joints are normally spaced and aligned. No significant arthropathic change The SI joints and symphysis pubis are normally spaced and aligned. Soft tissues are unremarkable. IMPRESSION: Negative.  His hip pain right now is located over the very lateral hip, and is it quite tender to touch.  This is different than hip pain he may have had in the past.  No known injury, no new activities noted He is not aware of any autoimmune disorder in the family  No history of RA noted He is active by walking, etc Exercise does not seem to make his hip better or worse The pain does not run down his leg, no leg  numbness or weakness  He also has noted a dry cough for "months" - at least 2 or 3 months now He does have occasional gerd The cough is generally not productive excpet when he has the reflux He is not sure, might have a little wheezing  tesslon perles do help with his cough at night and he could use a refill of this today His wife reports that he does "make noises" at night but no so much snoring He has had surgery for sleep apnea   He is on lyrica, trazodone, melatonin, klonopin- he is sleeping pretty well but does not sleep in late  Mood has been ok - he actually went off his zoloft and feels like he is doing ok without it   Prn fiorcet Klonopin at bedtime mobic prn Inderal prn Trazodone prn sleep Hydrocodone rarely for back pain Tessalon perles prn for cough Ritalin zoloft- not taking lyrica at bedtime dexilant ranitidine  N[facility]SR reviewed as below:  09/05/2017  2  09/01/2017  Lyrica 100 Mg Capsule  180 90 Je Cop  [id number]  Exp (4317)  1/3 1.34 LME Comm Ins  [place]  08/09/2017  2  08/04/2017  Clonazepam 0.5 Mg Tablet  180 90 Je Cop  [id number]  Exp (4317)  1/1 2.00 LME Comm Ins  [place]  06/21/2017  2  04/13/2017  Clonazepam 0.5 Mg Tablet  60 30 Je Cop  [id number]  Exp (4317)  4/3 2.00 LME Comm Ins  [place]  05/30/2017  2  04/13/2017  Clonazepam 0.5 Mg Tablet  60 30 Je Cop  [id number]  Exp (4317)  3/3 2.00 LME Comm Ins  [place]  05/10/2017  2  12/15/2016  Lyrica 100 Mg Capsule  180 90 Je Cop  [id number]  Exp (4317)  2/2 1.34 LME Comm Ins  [place]  04/21/2017  2  04/13/2017  Clonazepam 0.5 Mg Tablet  60 30 Je Cop  [id number]  Exp (4317)  2/3 2.00 LME Comm Ins  [place]  04/16/2017  2  04/13/2017  Clonazepam 0.5 Mg Tablet  60 30 Je Cop  [id number]  Exp (4317)  1/3 2.00 LME Comm Ins  [place]  03/27/2017  1  03/24/2017  Butalb-Acetaminoph-Caff-Codein  30 3 Je Cop  [id number]  [place] (8139)  1/1 45.00 MME Comm Ins    03/10/2017  2  12/12/2016  Clonazepam 0.5 Mg Tablet  60 30 Je Cop  [id number]  Exp  (4317)  3/2      His right rotator cuff injury is still bothersome but not to the point that he wants to have surgery- he has done PT and it did help   He still has urinary frequency- urology was not able to help much  He had upper GI and colonoscopy in 2017  Last refilled his hydrocodone in December -uses on rare occasion for back pain when it is not controlled by OTC meds  He needs a UDS, contract is done and UTD   He does have a history of TURP x2 for BPH Patient Active Problem List   Diagnosis Date Noted  . Hearing loss 12/15/2016  . Chronic fatigue 10/09/2015  . Generalized anxiety disorder 10/09/2015  . Memory loss 10/09/2015  . Obstructive sleep apnea of adult 02/09/2015  . Benign prostatic hyperplasia with urinary obstruction 11/18/2013  . Acid reflux 11/18/2013  . Degenerative arthritis of lumbar spine 11/18/2013  . Headache, migraine 11/18/2013  . External hemorrhoids with other complication 89/21/1941  . Restless leg 05/27/2009    Past Medical History:  Diagnosis Date  . Anxiety   . Arthritis   . Basal cell carcinoma   . BPH (benign prostatic hyperplasia)   . Depression   . Dysphagia   . Fibromyalgia   . GERD (gastroesophageal reflux disease)    otc  tums  . Headache    migraines  . Herpes simplex type 1 infection   . History of migraine   . Hypertension    no longer on medication  . Insomnia   . Lower back pain   . Morton's neuroma   . OSA (obstructive sleep apnea)   . Primary snoring   . Recurrent canker sores   . Urinary frequency     Past Surgical History:  Procedure Laterality Date  . EPIDIDYMECTOMY     for spermatocele  . EYE SURGERY Bilateral    lasik eye surgery  . FOOT SURGERY Left   . INGUINAL HERNIA REPAIR Left    01/25/1999  . MOUTH SURGERY    . pancreatic surgery r/t trauma    . SEPTOPLASTY N/A 02/09/2015   Procedure: SEPTOPLASTY;  Surgeon: Rozetta Nunnery, MD;  Location: [facility];  Service: ENT;  Laterality: N/A;  . SKIN  CANCER EXCISION    . TONSILLECTOMY    . TRANSURETHRAL INCISION OF PROSTATE    .  TURBINATE REDUCTION Bilateral 02/09/2015   Procedure: BILATERAL TURBINATE REDUCTION;  Surgeon: Rozetta Nunnery, MD;  Location: [facility];  Service: ENT;  Laterality: Bilateral;  . UVULOPALATOPHARYNGOPLASTY N/A 02/09/2015   Procedure: UVULOPALATOPHARYNGOPLASTY (UPPP);  Surgeon: Rozetta Nunnery, MD;  Location: [place] [facility];  Service: ENT;  Laterality: N/A;    Social History   Tobacco Use  . Smoking status: Former Smoker    Packs/day: 2.00    Years: 30.00    Pack years: 60.00    Types: Cigarettes    Last attempt to quit: 10/29/2005    Years since quitting: 11.9  . Smokeless tobacco: Never Used  Substance Use Topics  . Alcohol use: Yes    Alcohol/week: 0.0 oz    Comment: Rare  . Drug use: No    Family History  Problem Relation Age of Onset  . Coronary artery disease Mother   . Stroke Mother   . Hypertension Father   . Anxiety disorder Brother   . Skin cancer Brother     No Known Allergies  Medication list has been reviewed and updated.  Current Outpatient Medications on File Prior to Visit  Medication Sig Dispense Refill  . betamethasone dipropionate (DIPROLENE) 0.05 % cream Apply topically 2 (two) times daily. Use as needed 30 g 3  . Butalbital-APAP-Caff-Cod 50-300-40-30 MG CAPS Take 1-2 caps every 6 hours as needed for headache.  Max 6/24 hours 30 capsule 0  . Cholecalciferol (VITAMIN D3) 2000 UNITS TABS Take 2,000 Units by mouth daily.    . clonazePAM (KLONOPIN) 0.5 MG tablet Take 1 or 2 at bedtime as needed for sleep 180 tablet 1  . fluticasone (FLONASE) 50 MCG/ACT nasal spray Place 2 sprays into both nostrils daily. 43 g 3  . LYRICA 100 MG capsule Take 2 capsules (200 mg total) by mouth at bedtime. 180 capsule 3  . Melatonin 5 MG TABS Take 5 mg by mouth at bedtime.    . meloxicam (MOBIC) 15 MG tablet TAKE 1 TABLET DAILY 90 tablet 0  . propranolol (INDERAL) 10 MG tablet Take every 12 hours  as needed for tremor 180 tablet 3  . sertraline (ZOLOFT) 100 MG tablet Take 2 tablets (200 mg total) by mouth daily. 180 tablet 3  . traZODone (DESYREL) 100 MG tablet Take 1/2 or 1 tablet (50 or 100 mg) at bedtime as needed for sleep 90 tablet 3  . vitamin B-12 (CYANOCOBALAMIN) 500 MCG tablet Take 500 mcg by mouth daily.    . sucralfate (CARAFATE) 1 GM/10ML suspension Take 10 mLs (1 g total) by mouth 4 (four) times daily -  with meals and at bedtime. (Patient not taking: Reported on 10/09/2017) 420 mL 0   No current facility-administered medications on file prior to visit.     Review of Systems:  As per HPI- otherwise negative. No fever or chills No CP  Physical Examination: Vitals:   10/09/17 1238  BP: 112/90  Pulse: 95  Resp: 18  Temp: 98.3 F (36.8 C)  SpO2: 97%   Vitals:   10/09/17 1238  Weight: 184 lb (83.5 kg)  Height: 6' (1.829 m)   Body mass index is 24.95 kg/m. Ideal Body Weight: Weight in (lb) to have BMI = 25: 183.9  GEN: WDWN, NAD, Non-toxic, A & O x 3, normal weight, looks well  HEENT: Atraumatic, Normocephalic. Neck supple. No masses, No LAD.  Bilateral TM wnl, oropharynx normal.  PEERL,EOMI.   Ears and Nose: No external deformity. CV: RRR, No M/G/R. No  JVD. No thrill. No extra heart sounds. PULM: CTA B, no wheezes, crackles, rhonchi. No retractions. No resp. distress. No accessory muscle use. ABD: S, NT, ND, +BS. No rebound. No HSM. EXTR: No c/c/e NEURO Normal gait.  PSYCH: Normally interactive. Conversant. Not depressed or anxious appearing.  Calm demeanor.  Right hip: he is quite tender over the greater trochanter- likely GT bursitis.  Normal ROM of the hip,. Normal strength and DTR of all limbs.    VC obtained 40 mg of depomedrol and 36ml of 1% lidocaine prepared.  Hip prepped with betadine and alcohol. Injected into greater trochanteric bursa of RIGHT hip, pt tolerated well, no bleeding and no complications  Assessment and Plan: Physical  exam  Chronic low back pain, unspecified back pain laterality, with sciatica presence unspecified - Plan: HYDROcodone-acetaminophen (NORCO/VICODIN) 5-325 MG tablet  Screening for diabetes mellitus - Plan: Comprehensive metabolic panel, Hemoglobin A1c  Screening for hyperlipidemia - Plan: Lipid panel  Screening for deficiency anemia - Plan: CBC  Encounter for screening for lung cancer - Plan: CT CHEST LUNG CA SCREEN LOW DOSE W/O CM  Screening for prostate cancer - Plan: PSA  Cough - Plan: benzonatate (TESSALON) 100 MG capsule  Gastroesophageal reflux disease, esophagitis presence not specified - Plan: ranitidine (ZANTAC) 300 MG tablet  Chronic fatigue - Plan: methylphenidate (RITALIN) 20 MG tablet  GAD (generalized anxiety disorder)  Epigastric pain - Plan: Dexlansoprazole (DEXILANT) 30 MG capsule  Medication monitoring encounter - Plan: Pain Mgmt, Profile 8 w/Conf, U  Greater trochanteric bursitis of right hip - Plan: methylPREDNISolone acetate (DEPO-MEDROL) injection 40 mg  CPE  Ordered lung cancer screening CT as above Decided to delay immunizations as he got steroids today Refilled tessalon perles for cough- suspect this is due to reflux.  Chest CT pending as above Injection for his greater trochanteric bursitis which we hope will be helpful for him, asked him to keep me posted   Signed Lamar Blinks, MD  Received his labs as below:  Results for orders placed or performed in visit on 10/09/17  CBC  Result Value Ref Range   WBC 3.2 (L) 4.0 - 10.5 K/uL   RBC 4.70 4.22 - 5.81 Mil/uL   Platelets 139.0 (L) 150.0 - 400.0 K/uL   Hemoglobin 15.3 13.0 - 17.0 g/dL   HCT 44.6 39.0 - 52.0 %   MCV 94.8 78.0 - 100.0 fl   MCHC 34.3 30.0 - 36.0 g/dL   RDW 13.2 11.5 - 15.5 %  Comprehensive metabolic panel  Result Value Ref Range   Sodium 142 135 - 145 mEq/L   Potassium 4.1 3.5 - 5.1 mEq/L   Chloride 104 96 - 112 mEq/L   CO2 30 19 - 32 mEq/L   Glucose, Bld 93 70 - 99 mg/dL    BUN 16 6 - 23 mg/dL   Creatinine, Ser 1.07 0.40 - 1.50 mg/dL   Total Bilirubin 0.9 0.2 - 1.2 mg/dL   Alkaline Phosphatase 56 39 - 117 U/L   AST 18 0 - 37 U/L   ALT 15 0 - 53 U/L   Total Protein 6.9 6.0 - 8.3 g/dL   Albumin 4.5 3.5 - 5.2 g/dL   Calcium 9.7 8.4 - 10.5 mg/dL   GFR 73.72 >60.00 mL/min  Hemoglobin A1c  Result Value Ref Range   Hgb A1c MFr Bld 5.1 4.6 - 6.5 %  Lipid panel  Result Value Ref Range   Cholesterol 158 0 - 200 mg/dL   Triglycerides 109.0 0.0 - 149.0  mg/dL   HDL 48.80 >39.00 mg/dL   VLDL 21.8 0.0 - 40.0 mg/dL   LDL Cholesterol 88 0 - 99 mg/dL   Total CHOL/HDL Ratio 3    NonHDL 109.43   PSA  Result Value Ref Range   PSA 1.01 0.10 - 4.00 ng/mL    Labs overall look good Your white cell and platelet counts are mildly low- this is not unusual for you and is likely how you are made.  Your counts are not low enough to be dangerous at all.  I would like to continue to keep an eye on this for you Metabolic profile is normal A1c- average blood sugar- is normal Cholesterol is very good  Your PSA is still in normal range, but it has gone up a good bit since last year.  I have been trying to find some older PSA values for you, and found one from 2015- 0.61 11/21/2013   Lab Results      Component                Value               Date                      PSA                      1.01                10/09/2017                PSA                      0.33                07/20/2016            I believe in 2015 you had already had your prostate operations.  I am a bit concerned that your PSA has gone up and would like to repeat this test in 3 months.  I will order a PSA- and a blood count- for you as a lab visit only.  Please schedule a lab draw at your convenience.   Also, please refrain from sexual activity for 2-3 days prior to your PSA so we can get the most accurate result   How is you hip?  I hope that the shot works and again I apologize for getting betadine  on your best jeans- favorite jeans are precious!   We discussed this in clinic, but I have searched your chart and not been able to find a colonoscopy report.  I have documented that your colonoscopy was done on 06/13/15.  Can you remember the name of the doctor or the clinic who did this for you?  For thoroughness I would really like to locate this report if we can  Again, let's plan for a lab visit in 3 months, I will place orders for you.  Let me know if any questions and otherwise we can plan to visit in about 6 months JC

## 2017-10-09 ENCOUNTER — Other Ambulatory Visit: Payer: Self-pay | Admitting: Family Medicine

## 2017-10-09 ENCOUNTER — Encounter: Payer: Self-pay | Admitting: Family Medicine

## 2017-10-09 ENCOUNTER — Ambulatory Visit (INDEPENDENT_AMBULATORY_CARE_PROVIDER_SITE_OTHER): Payer: BLUE CROSS/BLUE SHIELD | Admitting: Family Medicine

## 2017-10-09 ENCOUNTER — Other Ambulatory Visit: Payer: Self-pay

## 2017-10-09 VITALS — BP 112/90 | HR 95 | Temp 98.3°F | Resp 18 | Ht 72.0 in | Wt 184.0 lb

## 2017-10-09 DIAGNOSIS — Z125 Encounter for screening for malignant neoplasm of prostate: Secondary | ICD-10-CM | POA: Diagnosis not present

## 2017-10-09 DIAGNOSIS — R5382 Chronic fatigue, unspecified: Secondary | ICD-10-CM

## 2017-10-09 DIAGNOSIS — Z13220 Encounter for screening for lipoid disorders: Secondary | ICD-10-CM | POA: Diagnosis not present

## 2017-10-09 DIAGNOSIS — R059 Cough, unspecified: Secondary | ICD-10-CM

## 2017-10-09 DIAGNOSIS — M545 Low back pain: Secondary | ICD-10-CM

## 2017-10-09 DIAGNOSIS — Z130 Encounter for screening for diseases of the blood and blood-forming organs and certain disorders involving the immune mechanism: Secondary | ICD-10-CM | POA: Diagnosis not present

## 2017-10-09 DIAGNOSIS — Z5181 Encounter for therapeutic drug level monitoring: Secondary | ICD-10-CM | POA: Diagnosis not present

## 2017-10-09 DIAGNOSIS — Z0000 Encounter for general adult medical examination without abnormal findings: Secondary | ICD-10-CM | POA: Diagnosis not present

## 2017-10-09 DIAGNOSIS — G894 Chronic pain syndrome: Secondary | ICD-10-CM

## 2017-10-09 DIAGNOSIS — Z122 Encounter for screening for malignant neoplasm of respiratory organs: Secondary | ICD-10-CM

## 2017-10-09 DIAGNOSIS — M7061 Trochanteric bursitis, right hip: Secondary | ICD-10-CM

## 2017-10-09 DIAGNOSIS — K219 Gastro-esophageal reflux disease without esophagitis: Secondary | ICD-10-CM

## 2017-10-09 DIAGNOSIS — R05 Cough: Secondary | ICD-10-CM

## 2017-10-09 DIAGNOSIS — R972 Elevated prostate specific antigen [PSA]: Secondary | ICD-10-CM

## 2017-10-09 DIAGNOSIS — Z131 Encounter for screening for diabetes mellitus: Secondary | ICD-10-CM

## 2017-10-09 DIAGNOSIS — G8929 Other chronic pain: Secondary | ICD-10-CM

## 2017-10-09 DIAGNOSIS — D72819 Decreased white blood cell count, unspecified: Secondary | ICD-10-CM

## 2017-10-09 DIAGNOSIS — F411 Generalized anxiety disorder: Secondary | ICD-10-CM

## 2017-10-09 DIAGNOSIS — R1013 Epigastric pain: Secondary | ICD-10-CM

## 2017-10-09 LAB — COMPREHENSIVE METABOLIC PANEL
ALBUMIN: 4.5 g/dL (ref 3.5–5.2)
ALT: 15 U/L (ref 0–53)
AST: 18 U/L (ref 0–37)
Alkaline Phosphatase: 56 U/L (ref 39–117)
BILIRUBIN TOTAL: 0.9 mg/dL (ref 0.2–1.2)
BUN: 16 mg/dL (ref 6–23)
CHLORIDE: 104 meq/L (ref 96–112)
CO2: 30 mEq/L (ref 19–32)
Calcium: 9.7 mg/dL (ref 8.4–10.5)
Creatinine, Ser: 1.07 mg/dL (ref 0.40–1.50)
GFR: 73.72 mL/min (ref >60.00)
Glucose, Bld: 93 mg/dL (ref 70–99)
POTASSIUM: 4.1 meq/L (ref 3.5–5.1)
SODIUM: 142 meq/L (ref 135–145)
Total Protein: 6.9 g/dL (ref 6.0–8.3)

## 2017-10-09 LAB — CBC
HEMATOCRIT: 44.6 % (ref 39.0–52.0)
Hemoglobin: 15.3 g/dL (ref 13.0–17.0)
MCHC: 34.3 g/dL (ref 30.0–36.0)
MCV: 94.8 fl (ref 78.0–100.0)
Platelets: 139 10*3/uL — ABNORMAL LOW (ref 150.0–400.0)
RBC: 4.7 Mil/uL (ref 4.22–5.81)
RDW: 13.2 % (ref 11.5–15.5)
WBC: 3.2 10*3/uL — ABNORMAL LOW (ref 4.0–10.5)

## 2017-10-09 LAB — PSA: PSA: 1.01 ng/mL (ref 0.10–4.00)

## 2017-10-09 LAB — LIPID PANEL
CHOL/HDL RATIO: 3
CHOLESTEROL: 158 mg/dL (ref 0–200)
HDL: 48.8 mg/dL (ref >39.00)
LDL CALC: 88 mg/dL (ref 0–99)
NONHDL: 109.43
Triglycerides: 109 mg/dL (ref 0.0–149.0)
VLDL: 21.8 mg/dL (ref 0.0–40.0)

## 2017-10-09 LAB — HEMOGLOBIN A1C: Hgb A1c MFr Bld: 5.1 % (ref 4.6–6.5)

## 2017-10-09 MED ORDER — METHYLPHENIDATE HCL 20 MG PO TABS: 20.0000 mg | ORAL_TABLET | Freq: Two times a day (BID) | ORAL | 0 refills | Status: DC

## 2017-10-09 MED ORDER — RANITIDINE HCL 300 MG PO TABS: 300.0000 mg | ORAL_TABLET | Freq: Every day | ORAL | 3 refills | Status: DC

## 2017-10-09 MED ORDER — DEXLANSOPRAZOLE 30 MG PO CPDR: 30.0000 mg | DELAYED_RELEASE_CAPSULE | Freq: Every day | ORAL | 3 refills | Status: DC

## 2017-10-09 MED ORDER — METHYLPREDNISOLONE ACETATE 40 MG/ML IJ SUSP
40.0000 mg | Freq: Once | INTRAMUSCULAR | Status: AC
  Administered 2017-10-09: 40 mg via INTRA_ARTICULAR

## 2017-10-09 MED ORDER — HYDROCODONE-ACETAMINOPHEN 5-325 MG PO TABS
1.0000 | ORAL_TABLET | Freq: Three times a day (TID) | ORAL | 0 refills | Status: DC | PRN
Start: 2017-10-09 — End: 2017-12-18

## 2017-10-09 MED ORDER — BENZONATATE 100 MG PO CAPS: 100.0000 mg | ORAL_CAPSULE | Freq: Three times a day (TID) | ORAL | 3 refills | Status: DC | PRN

## 2017-10-09 NOTE — Patient Instructions (Signed)
It was nice to see you today!  I refilled your medications, let me know when you need further refills Ok to use the tessalon perles chronically at bedtime for cough I suspect that your cough is due to reflux- you are really doing what you can for this.  We will get a screening CT of your lungs You will be due for the 2 shot pneumonia series when you turn 67- this can be done at your convenience, not an emergency You might also want to have the shingrix shots to prevent shingles  I did a steroid injection in your right hip today which I hope will be helpful.  Please let me know if the pain does not remit   I will be in touch with your lab results asap    Health Maintenance, Male A healthy lifestyle and preventive care is important for your health and wellness. Ask your health care provider about what schedule of regular examinations is right for you. What should I know about weight and diet? Eat a Healthy Diet  Eat plenty of vegetables, fruits, whole grains, low-fat dairy products, and lean protein.  Do not eat a lot of foods high in solid fats, added sugars, or salt.  Maintain a Healthy Weight Regular exercise can help you achieve or maintain a healthy weight. You should:  Do at least 150 minutes of exercise each week. The exercise should increase your heart rate and make you sweat (moderate-intensity exercise).  Do strength-training exercises at least twice a week.  Watch Your Levels of Cholesterol and Blood Lipids  Have your blood tested for lipids and cholesterol every 5 years starting at 65 years of age. If you are at high risk for heart disease, you should start having your blood tested when you are 65 years old. You may need to have your cholesterol levels checked more often if: ? Your lipid or cholesterol levels are high. ? You are older than 65 years of age. ? You are at high risk for heart disease.  What should I know about cancer screening? Many types of cancers can be  detected early and may often be prevented. Lung Cancer  You should be screened every year for lung cancer if: ? You are a current smoker who has smoked for at least 30 years. ? You are a former smoker who has quit within the past 15 years.  Talk to your health care provider about your screening options, when you should start screening, and how often you should be screened.  Colorectal Cancer  Routine colorectal cancer screening usually begins at 65 years of age and should be repeated every 5-10 years until you are 65 years old. You may need to be screened more often if early forms of precancerous polyps or small growths are found. Your health care provider may recommend screening at an earlier age if you have risk factors for colon cancer.  Your health care provider may recommend using home test kits to check for hidden blood in the stool.  A small camera at the end of a tube can be used to examine your colon (sigmoidoscopy or colonoscopy). This checks for the earliest forms of colorectal cancer.  Prostate and Testicular Cancer  Depending on your age and overall health, your health care provider may do certain tests to screen for prostate and testicular cancer.  Talk to your health care provider about any symptoms or concerns you have about testicular or prostate cancer.  Skin Cancer  Check  your skin from head to toe regularly.  Tell your health care provider about any new moles or changes in moles, especially if: ? There is a change in a mole's size, shape, or color. ? You have a mole that is larger than a pencil eraser.  Always use sunscreen. Apply sunscreen liberally and repeat throughout the day.  Protect yourself by wearing long sleeves, pants, a wide-brimmed hat, and sunglasses when outside.  What should I know about heart disease, diabetes, and high blood pressure?  If you are 52-36 years of age, have your blood pressure checked every 3-5 years. If you are 69 years of age  or older, have your blood pressure checked every year. You should have your blood pressure measured twice-once when you are at a hospital or clinic, and once when you are not at a hospital or clinic. Record the average of the two measurements. To check your blood pressure when you are not at a hospital or clinic, you can use: ? An automated blood pressure machine at a pharmacy. ? A home blood pressure monitor.  Talk to your health care provider about your target blood pressure.  If you are between 62-52 years old, ask your health care provider if you should take aspirin to prevent heart disease.  Have regular diabetes screenings by checking your fasting blood sugar level. ? If you are at a normal weight and have a low risk for diabetes, have this test once every three years after the age of 46. ? If you are overweight and have a high risk for diabetes, consider being tested at a younger age or more often.  A one-time screening for abdominal aortic aneurysm (AAA) by ultrasound is recommended for men aged 37-75 years who are current or former smokers. What should I know about preventing infection? Hepatitis B If you have a higher risk for hepatitis B, you should be screened for this virus. Talk with your health care provider to find out if you are at risk for hepatitis B infection. Hepatitis C Blood testing is recommended for:  Everyone born from [birth year] through 1963.  Anyone with known risk factors for hepatitis C.  Sexually Transmitted Diseases (STDs)  You should be screened each year for STDs including gonorrhea and chlamydia if: ? You are sexually active and are younger than 65 years of age. ? You are older than 65 years of age and your health care provider tells you that you are at risk for this type of infection. ? Your sexual activity has changed since you were last screened and you are at an increased risk for chlamydia or gonorrhea. Ask your health care provider if you are at  risk.  Talk with your health care provider about whether you are at high risk of being infected with HIV. Your health care provider may recommend a prescription medicine to help prevent HIV infection.  What else can I do?  Schedule regular health, dental, and eye exams.  Stay current with your vaccines (immunizations).  Do not use any tobacco products, such as cigarettes, chewing tobacco, and e-cigarettes. If you need help quitting, ask your health care provider.  Limit alcohol intake to no more than 2 drinks per day. One drink equals 12 ounces of beer, 5 ounces of wine, or 1 ounces of hard liquor.  Do not use street drugs.  Do not share needles.  Ask your health care provider for help if you need support or information about quitting drugs.  Tell your  health care provider if you often feel depressed.  Tell your health care provider if you have ever been abused or do not feel safe at home. This information is not intended to replace advice given to you by your health care provider. Make sure you discuss any questions you have with your health care provider. Document Released: 08/27/2007 Document Revised: 10/28/2015 Document Reviewed: 12/02/2014 Elsevier Interactive Patient Education  2018 Elsevier Inc.  

## 2017-10-09 NOTE — Progress Notes (Signed)
Pt requesting refills of norco, and tessalon.

## 2017-10-10 ENCOUNTER — Ambulatory Visit (HOSPITAL_BASED_OUTPATIENT_CLINIC_OR_DEPARTMENT_OTHER)
Admission: RE | Admit: 2017-10-10 | Discharge: 2017-10-10 | Disposition: A | Discharge status: Home / Self Care | Payer: BLUE CROSS/BLUE SHIELD | Source: Ambulatory Visit | Attending: Family Medicine | Admitting: Family Medicine

## 2017-10-10 DIAGNOSIS — Z122 Encounter for screening for malignant neoplasm of respiratory organs: Secondary | ICD-10-CM | POA: Diagnosis not present

## 2017-10-10 DIAGNOSIS — I700 Atherosclerosis of aorta: Secondary | ICD-10-CM | POA: Insufficient documentation

## 2017-10-10 DIAGNOSIS — Z87891 Personal history of nicotine dependence: Secondary | ICD-10-CM | POA: Diagnosis not present

## 2017-10-10 DIAGNOSIS — I2510 Atherosclerotic heart disease of native coronary artery without angina pectoris: Secondary | ICD-10-CM | POA: Diagnosis not present

## 2017-10-12 ENCOUNTER — Encounter: Payer: Self-pay | Admitting: Family Medicine

## 2017-10-12 DIAGNOSIS — I2510 Atherosclerotic heart disease of native coronary artery without angina pectoris: Secondary | ICD-10-CM

## 2017-10-12 LAB — PAIN MGMT, PROFILE 8 W/CONF, U
6 ACETYLMORPHINE: NEGATIVE ng/mL (ref <10)
ALPHAHYDROXYALPRAZOLAM: NEGATIVE ng/mL (ref <25)
ALPHAHYDROXYTRIAZOLAM: NEGATIVE ng/mL (ref <50)
AMPHETAMINES: NEGATIVE ng/mL (ref <500)
Alcohol Metabolites: NEGATIVE ng/mL (ref <500)
Alphahydroxymidazolam: NEGATIVE ng/mL (ref <50)
Aminoclonazepam: 170 ng/mL — ABNORMAL HIGH (ref <25)
BENZODIAZEPINES: POSITIVE ng/mL — AB (ref <100)
BUPRENORPHINE, URINE: NEGATIVE ng/mL (ref <5)
CODEINE: NEGATIVE ng/mL (ref <50)
Cocaine Metabolite: NEGATIVE ng/mL (ref <150)
Creatinine: 92.2 mg/dL
HYDROCODONE: NEGATIVE ng/mL (ref <50)
Hydromorphone: NEGATIVE ng/mL (ref <50)
Hydroxyethylflurazepam: NEGATIVE ng/mL (ref <50)
Lorazepam: NEGATIVE ng/mL (ref <50)
MDMA: NEGATIVE ng/mL (ref <500)
Marijuana Metabolite: NEGATIVE ng/mL (ref <20)
Morphine: NEGATIVE ng/mL (ref <50)
NORDIAZEPAM: NEGATIVE ng/mL (ref <50)
NORHYDROCODONE: NEGATIVE ng/mL (ref <50)
OPIATES: NEGATIVE ng/mL (ref <100)
OXAZEPAM: NEGATIVE ng/mL (ref <50)
OXYCODONE: NEGATIVE ng/mL (ref <100)
Oxidant: NEGATIVE ug/mL (ref <200)
Temazepam: NEGATIVE ng/mL (ref <50)
pH: 5.51 (ref 4.5–9.0)

## 2017-10-13 ENCOUNTER — Telehealth: Payer: Self-pay | Admitting: Family Medicine

## 2017-10-13 ENCOUNTER — Encounter: Payer: Self-pay | Admitting: Family Medicine

## 2017-10-13 NOTE — Telephone Encounter (Signed)
Copied from [place] [id number]. Topic: General - Other >> Oct 13, 2017  1:22 PM Lennox Solders wrote: Reason for [id number] [facility] is calling the delixant will cost patient 125.00 a month and the alternatives will be cheaper co pay omeprazole, pantoprazole sodium dr , and lansoprazole dr , rabeprazole and esomeprazole mag dr . Patient reference number is [id number]

## 2017-10-14 MED ORDER — OMEPRAZOLE 20 MG PO CPDR: 20.0000 mg | DELAYED_RELEASE_CAPSULE | Freq: Every day | ORAL | 3 refills | Status: DC

## 2017-10-31 ENCOUNTER — Telehealth (HOSPITAL_COMMUNITY): Payer: Self-pay

## 2017-10-31 NOTE — Telephone Encounter (Signed)
Encounter complete. 

## 2017-11-02 ENCOUNTER — Ambulatory Visit (HOSPITAL_COMMUNITY)
Admission: RE | Admit: 2017-11-02 | Discharge: 2017-11-02 | Disposition: A | Discharge status: Home / Self Care | Payer: BLUE CROSS/BLUE SHIELD | Source: Ambulatory Visit | Attending: Cardiology | Admitting: Cardiology

## 2017-11-02 DIAGNOSIS — I2510 Atherosclerotic heart disease of native coronary artery without angina pectoris: Secondary | ICD-10-CM | POA: Diagnosis not present

## 2017-11-02 LAB — EXERCISE TOLERANCE TEST
CHL CUP MPHR: 156 {beats}/min
CHL RATE OF PERCEIVED EXERTION: 17
CSEPED: 10 min
CSEPEDS: 17 s
CSEPHR: 103 %
Estimated workload: 12.2 METS
Peak HR: 160 {beats}/min
Rest HR: 63 {beats}/min

## 2017-11-03 ENCOUNTER — Encounter: Payer: Self-pay | Admitting: Family Medicine

## 2017-11-06 DIAGNOSIS — N410 Acute prostatitis: Secondary | ICD-10-CM | POA: Diagnosis not present

## 2017-11-06 DIAGNOSIS — N509 Disorder of male genital organs, unspecified: Secondary | ICD-10-CM | POA: Diagnosis not present

## 2017-11-29 ENCOUNTER — Telehealth: Payer: Self-pay

## 2017-11-29 NOTE — Telephone Encounter (Signed)
PA form received from [place]. Form completed and faxed to [phone]. Awaiting determination.

## 2017-11-30 ENCOUNTER — Encounter: Payer: Self-pay | Admitting: Family Medicine

## 2017-12-01 ENCOUNTER — Encounter: Payer: Self-pay | Admitting: Family Medicine

## 2017-12-01 DIAGNOSIS — G894 Chronic pain syndrome: Secondary | ICD-10-CM

## 2017-12-01 NOTE — Telephone Encounter (Signed)
PA actually for Tier exception to help lower cost of drug- however this has been denied as there is no generic substitution at this time. Pt having hard time paying for Lyrica.

## 2017-12-04 ENCOUNTER — Encounter: Payer: Self-pay | Admitting: Family Medicine

## 2017-12-05 ENCOUNTER — Other Ambulatory Visit: Payer: Self-pay | Admitting: Family Medicine

## 2017-12-05 DIAGNOSIS — M7581 Other shoulder lesions, right shoulder: Secondary | ICD-10-CM | POA: Diagnosis not present

## 2017-12-05 DIAGNOSIS — G894 Chronic pain syndrome: Secondary | ICD-10-CM

## 2017-12-05 MED ORDER — PREGABALIN 75 MG PO CAPS
150.0000 mg | ORAL_CAPSULE | Freq: Every day | ORAL | 5 refills | Status: DC
Start: 2017-12-05 — End: 2017-12-15

## 2017-12-13 DIAGNOSIS — M75101 Unspecified rotator cuff tear or rupture of right shoulder, not specified as traumatic: Secondary | ICD-10-CM | POA: Insufficient documentation

## 2017-12-14 ENCOUNTER — Encounter: Payer: Self-pay | Admitting: Family Medicine

## 2017-12-14 DIAGNOSIS — G894 Chronic pain syndrome: Secondary | ICD-10-CM

## 2017-12-15 MED ORDER — LYRICA 100 MG PO CAPS: 200.0000 mg | ORAL_CAPSULE | Freq: Every day | ORAL | 1 refills | Status: DC

## 2017-12-18 ENCOUNTER — Encounter: Payer: Self-pay | Admitting: Family Medicine

## 2017-12-18 ENCOUNTER — Other Ambulatory Visit: Payer: Self-pay

## 2017-12-18 DIAGNOSIS — F411 Generalized anxiety disorder: Secondary | ICD-10-CM

## 2017-12-18 DIAGNOSIS — M5430 Sciatica, unspecified side: Secondary | ICD-10-CM

## 2017-12-18 DIAGNOSIS — M47816 Spondylosis without myelopathy or radiculopathy, lumbar region: Secondary | ICD-10-CM

## 2017-12-18 DIAGNOSIS — F5101 Primary insomnia: Secondary | ICD-10-CM

## 2017-12-18 DIAGNOSIS — R5382 Chronic fatigue, unspecified: Secondary | ICD-10-CM

## 2017-12-18 DIAGNOSIS — G894 Chronic pain syndrome: Secondary | ICD-10-CM

## 2017-12-18 MED ORDER — METHYLPHENIDATE HCL 20 MG PO TABS: 20.0000 mg | ORAL_TABLET | Freq: Two times a day (BID) | ORAL | 0 refills | Status: DC

## 2017-12-18 MED ORDER — CLONAZEPAM 0.5 MG PO TABS: ORAL_TABLET | ORAL | 1 refills | Status: DC

## 2017-12-18 MED ORDER — HYDROCODONE-ACETAMINOPHEN 5-325 MG PO TABS
1.0000 | ORAL_TABLET | Freq: Three times a day (TID) | ORAL | 0 refills | Status: DC | PRN
Start: 2017-12-18 — End: 2018-05-03

## 2017-12-18 MED ORDER — TRAZODONE HCL 100 MG PO TABS: ORAL_TABLET | ORAL | 3 refills | Status: DC

## 2017-12-18 NOTE — Telephone Encounter (Signed)
Seen in July Reviewed [facility]: 12/05/2017  1  12/05/2017  Pregabalin 75 Mg Capsule  60.00 30 Je Cop  [id number]  Wal (8139)  0/5 1.00 LME Comm Ins  [place]  11/06/2017  1  11/06/2017  Tramadol Hcl 50 Mg Tablet  20.00 [address]  [id number]  Wal [phone]  0/0 16.67 MME Comm Ins  [place]  10/18/2017  2  08/04/2017  Clonazepam 0.5 Mg Tablet  180.00 90 Je Cop  [id number]  Exp (4317)  1/1 2.00 LME Comm Ins  [place]  10/17/2017  2  10/09/2017  Methylphenidate 20 Mg Tablet  180.00 90 Je Cop  [id number]  Exp (4317)  0/0  Comm Ins  [place]  10/17/2017  2  10/09/2017  Hydrocodone-Acetamin 5-325 Mg  21.00 7 Je Cop  [id number]  Exp (4317)  0/0 15.00 MME Comm Ins  [place]  09/05/2017  2  09/01/2017  Lyrica 100 Mg Capsule  180.00 90 Je Cop  [id number]  Exp (4317)  0/3 1.34 LME Comm Ins  [place]  08/09/2017  2  08/04/2017  Clonazepam 0.5 Mg Tablet  180.00 90 Je Cop  [id number]  Exp (4317)  0/1 2.00 LME Comm Ins  [place]  06/21/2017  2  04/13/2017  Clonazepam 0.5 Mg Tablet  60.00 30 Je Cop  [id number]  Exp (4317)  3/3 2.00 LME Comm Ins  [place]  05/30/2017  2  04/13/2017  Clonazepam 0.5 Mg Tablet  60.00 30 Je Cop  [id number]  Exp (4317)  2/3 2.00 LME Comm Ins  [place]  05/10/2017  2  12/15/2016  Lyrica 100 Mg Capsule  180.00 90 Je Cop  [id number]  Exp (5189)  1/2       UDS 7/19

## 2017-12-18 NOTE — Telephone Encounter (Signed)
Pt requesting refills on trazadone, clonzepam, ritalin, and hydrocodone. Would like all meds sent to [facility].

## 2017-12-21 MED ORDER — PREGABALIN 100 MG PO CAPS: 200.0000 mg | ORAL_CAPSULE | Freq: Every day | ORAL | 1 refills | Status: DC

## 2017-12-31 ENCOUNTER — Other Ambulatory Visit: Payer: Self-pay | Admitting: Family Medicine

## 2017-12-31 DIAGNOSIS — R251 Tremor, unspecified: Secondary | ICD-10-CM

## 2018-02-01 ENCOUNTER — Other Ambulatory Visit: Payer: Self-pay

## 2018-02-06 DIAGNOSIS — M7581 Other shoulder lesions, right shoulder: Secondary | ICD-10-CM | POA: Diagnosis not present

## 2018-02-21 DIAGNOSIS — G8918 Other acute postprocedural pain: Secondary | ICD-10-CM | POA: Diagnosis not present

## 2018-02-21 DIAGNOSIS — M75111 Incomplete rotator cuff tear or rupture of right shoulder, not specified as traumatic: Secondary | ICD-10-CM | POA: Diagnosis not present

## 2018-02-21 DIAGNOSIS — I871 Compression of vein: Secondary | ICD-10-CM | POA: Diagnosis not present

## 2018-02-21 DIAGNOSIS — M75101 Unspecified rotator cuff tear or rupture of right shoulder, not specified as traumatic: Secondary | ICD-10-CM | POA: Diagnosis not present

## 2018-02-21 DIAGNOSIS — M67813 Other specified disorders of tendon, right shoulder: Secondary | ICD-10-CM | POA: Diagnosis not present

## 2018-02-21 DIAGNOSIS — M65811 Other synovitis and tenosynovitis, right shoulder: Secondary | ICD-10-CM | POA: Diagnosis not present

## 2018-02-21 DIAGNOSIS — M7581 Other shoulder lesions, right shoulder: Secondary | ICD-10-CM | POA: Diagnosis not present

## 2018-02-22 ENCOUNTER — Encounter: Payer: Self-pay | Admitting: Family Medicine

## 2018-02-26 ENCOUNTER — Encounter: Payer: Self-pay | Admitting: Family Medicine

## 2018-02-26 ENCOUNTER — Other Ambulatory Visit: Payer: Self-pay

## 2018-02-26 DIAGNOSIS — M75101 Unspecified rotator cuff tear or rupture of right shoulder, not specified as traumatic: Secondary | ICD-10-CM | POA: Diagnosis not present

## 2018-02-26 DIAGNOSIS — M6281 Muscle weakness (generalized): Secondary | ICD-10-CM | POA: Diagnosis not present

## 2018-02-26 DIAGNOSIS — G894 Chronic pain syndrome: Secondary | ICD-10-CM

## 2018-02-26 DIAGNOSIS — M25511 Pain in right shoulder: Secondary | ICD-10-CM | POA: Diagnosis not present

## 2018-02-26 DIAGNOSIS — M2560 Stiffness of unspecified joint, not elsewhere classified: Secondary | ICD-10-CM | POA: Diagnosis not present

## 2018-03-05 DIAGNOSIS — M6281 Muscle weakness (generalized): Secondary | ICD-10-CM | POA: Diagnosis not present

## 2018-03-05 DIAGNOSIS — M25511 Pain in right shoulder: Secondary | ICD-10-CM | POA: Diagnosis not present

## 2018-03-05 DIAGNOSIS — M2560 Stiffness of unspecified joint, not elsewhere classified: Secondary | ICD-10-CM | POA: Diagnosis not present

## 2018-03-05 DIAGNOSIS — M75101 Unspecified rotator cuff tear or rupture of right shoulder, not specified as traumatic: Secondary | ICD-10-CM | POA: Diagnosis not present

## 2018-03-09 DIAGNOSIS — M2560 Stiffness of unspecified joint, not elsewhere classified: Secondary | ICD-10-CM | POA: Diagnosis not present

## 2018-03-09 DIAGNOSIS — M75101 Unspecified rotator cuff tear or rupture of right shoulder, not specified as traumatic: Secondary | ICD-10-CM | POA: Diagnosis not present

## 2018-03-09 DIAGNOSIS — M6281 Muscle weakness (generalized): Secondary | ICD-10-CM | POA: Diagnosis not present

## 2018-03-09 DIAGNOSIS — M25511 Pain in right shoulder: Secondary | ICD-10-CM | POA: Diagnosis not present

## 2018-03-12 DIAGNOSIS — M2560 Stiffness of unspecified joint, not elsewhere classified: Secondary | ICD-10-CM | POA: Diagnosis not present

## 2018-03-12 DIAGNOSIS — M25511 Pain in right shoulder: Secondary | ICD-10-CM | POA: Diagnosis not present

## 2018-03-12 DIAGNOSIS — M75101 Unspecified rotator cuff tear or rupture of right shoulder, not specified as traumatic: Secondary | ICD-10-CM | POA: Diagnosis not present

## 2018-03-12 DIAGNOSIS — M6281 Muscle weakness (generalized): Secondary | ICD-10-CM | POA: Diagnosis not present

## 2018-03-13 ENCOUNTER — Other Ambulatory Visit: Payer: Self-pay

## 2018-03-15 DIAGNOSIS — M2560 Stiffness of unspecified joint, not elsewhere classified: Secondary | ICD-10-CM | POA: Diagnosis not present

## 2018-03-15 DIAGNOSIS — M6281 Muscle weakness (generalized): Secondary | ICD-10-CM | POA: Diagnosis not present

## 2018-03-15 DIAGNOSIS — M25511 Pain in right shoulder: Secondary | ICD-10-CM | POA: Diagnosis not present

## 2018-03-15 DIAGNOSIS — M75101 Unspecified rotator cuff tear or rupture of right shoulder, not specified as traumatic: Secondary | ICD-10-CM | POA: Diagnosis not present

## 2018-03-18 IMAGING — DX DG HIP (WITH OR WITHOUT PELVIS) 2-3V*R*
3 series · 3 of 3 positions shown · non-contrast
Comparison: None.

CLINICAL DATA: 66 y/o male with RIGHT hip pain x1 month with NKI.
Pain is at lateral side. No prior injury.

EXAM:
DG HIP (WITH OR WITHOUT PELVIS) 2-3V RIGHT

[pelvis ap]
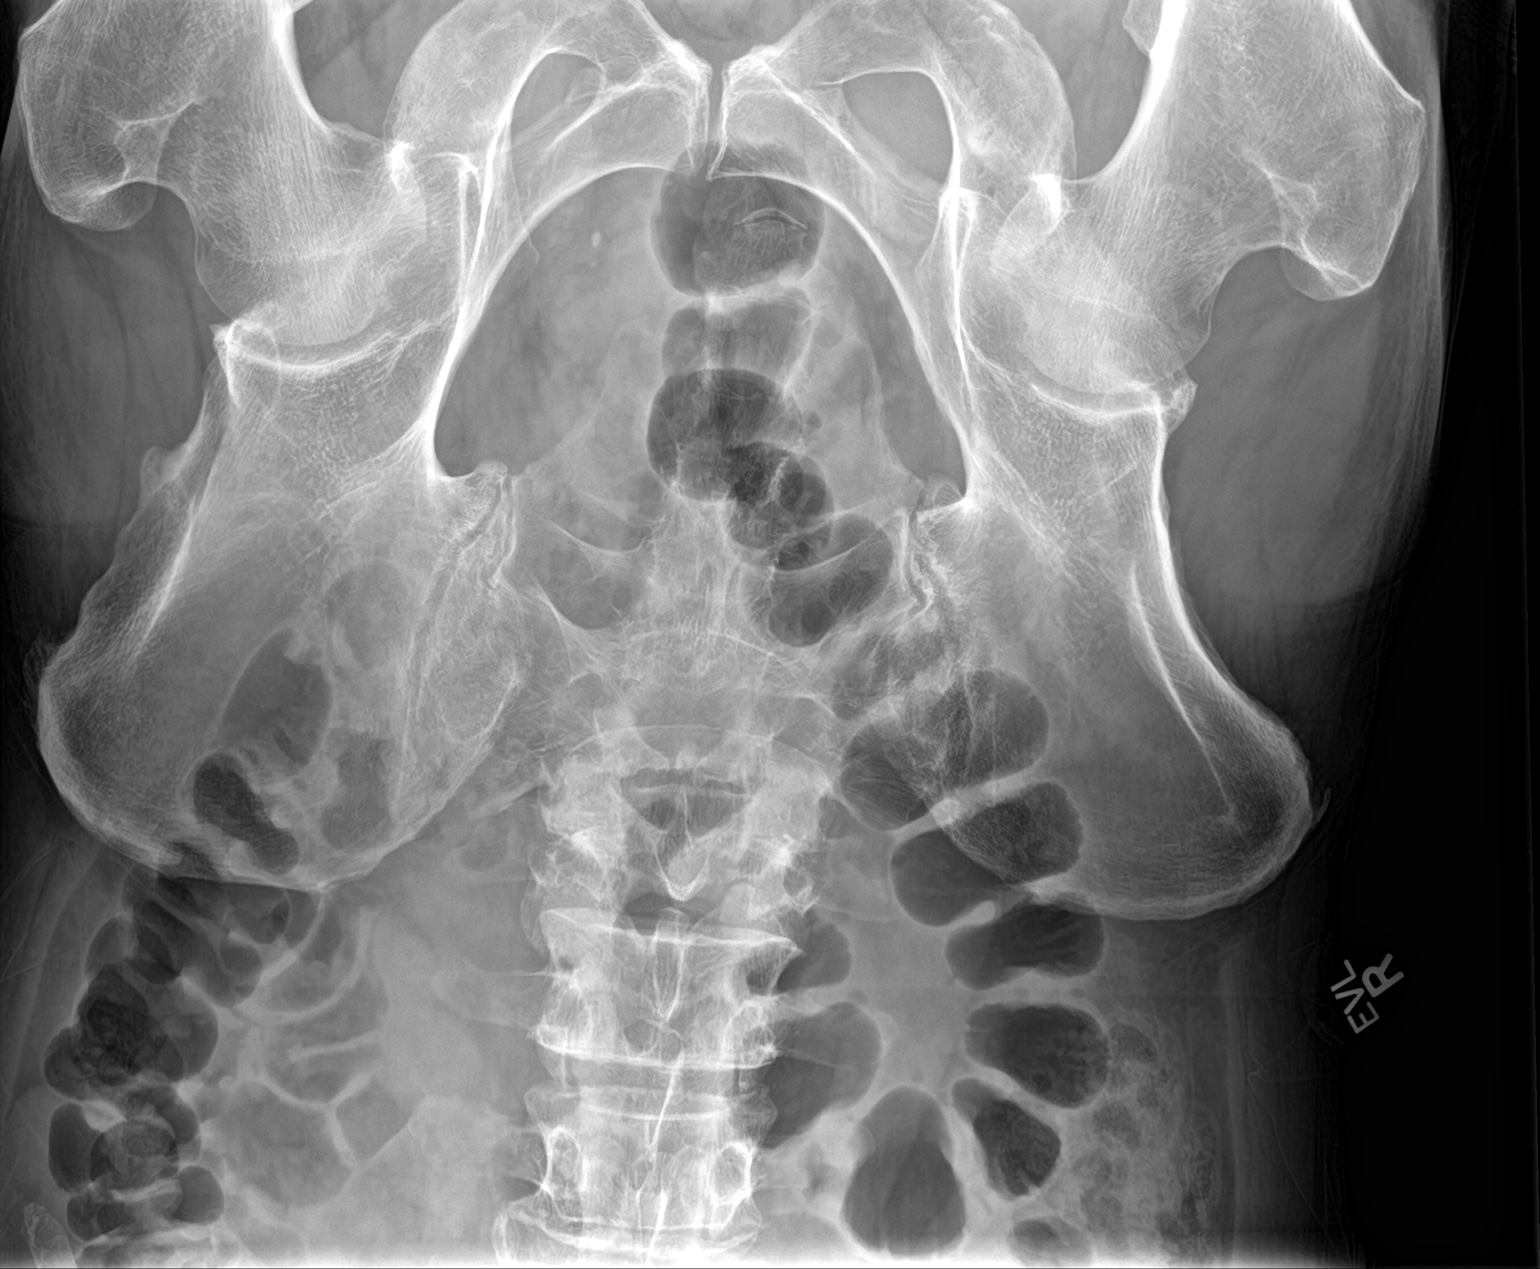

[hip ap]
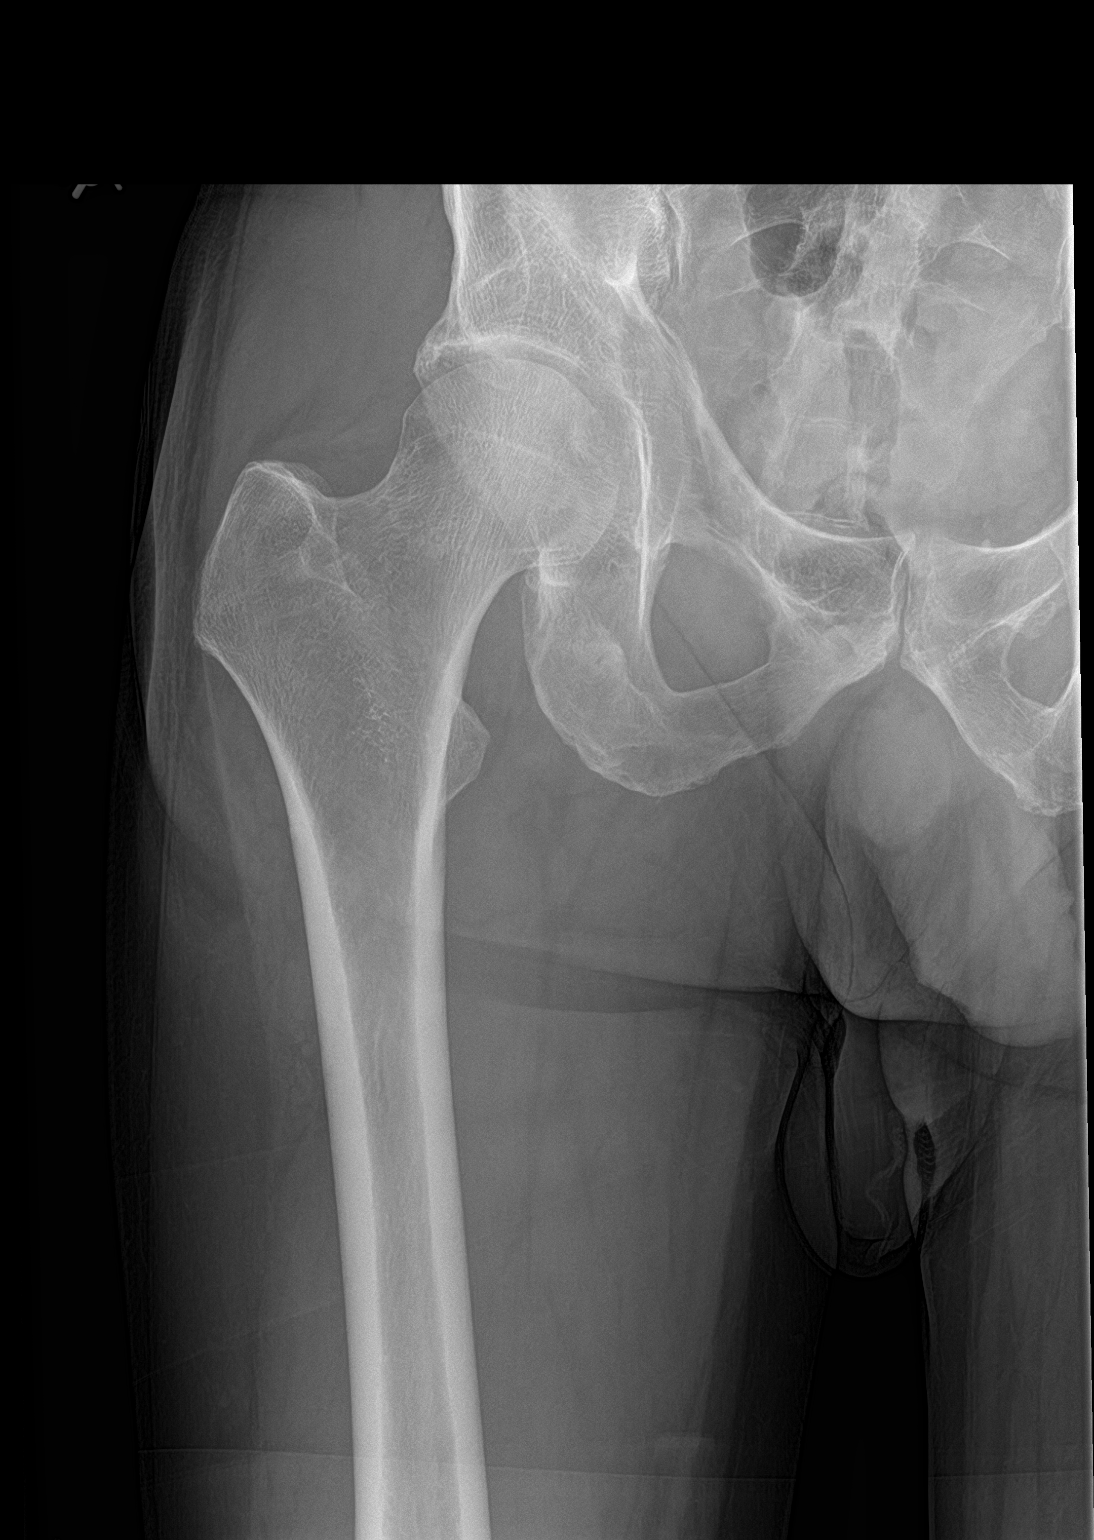

[hip lat]
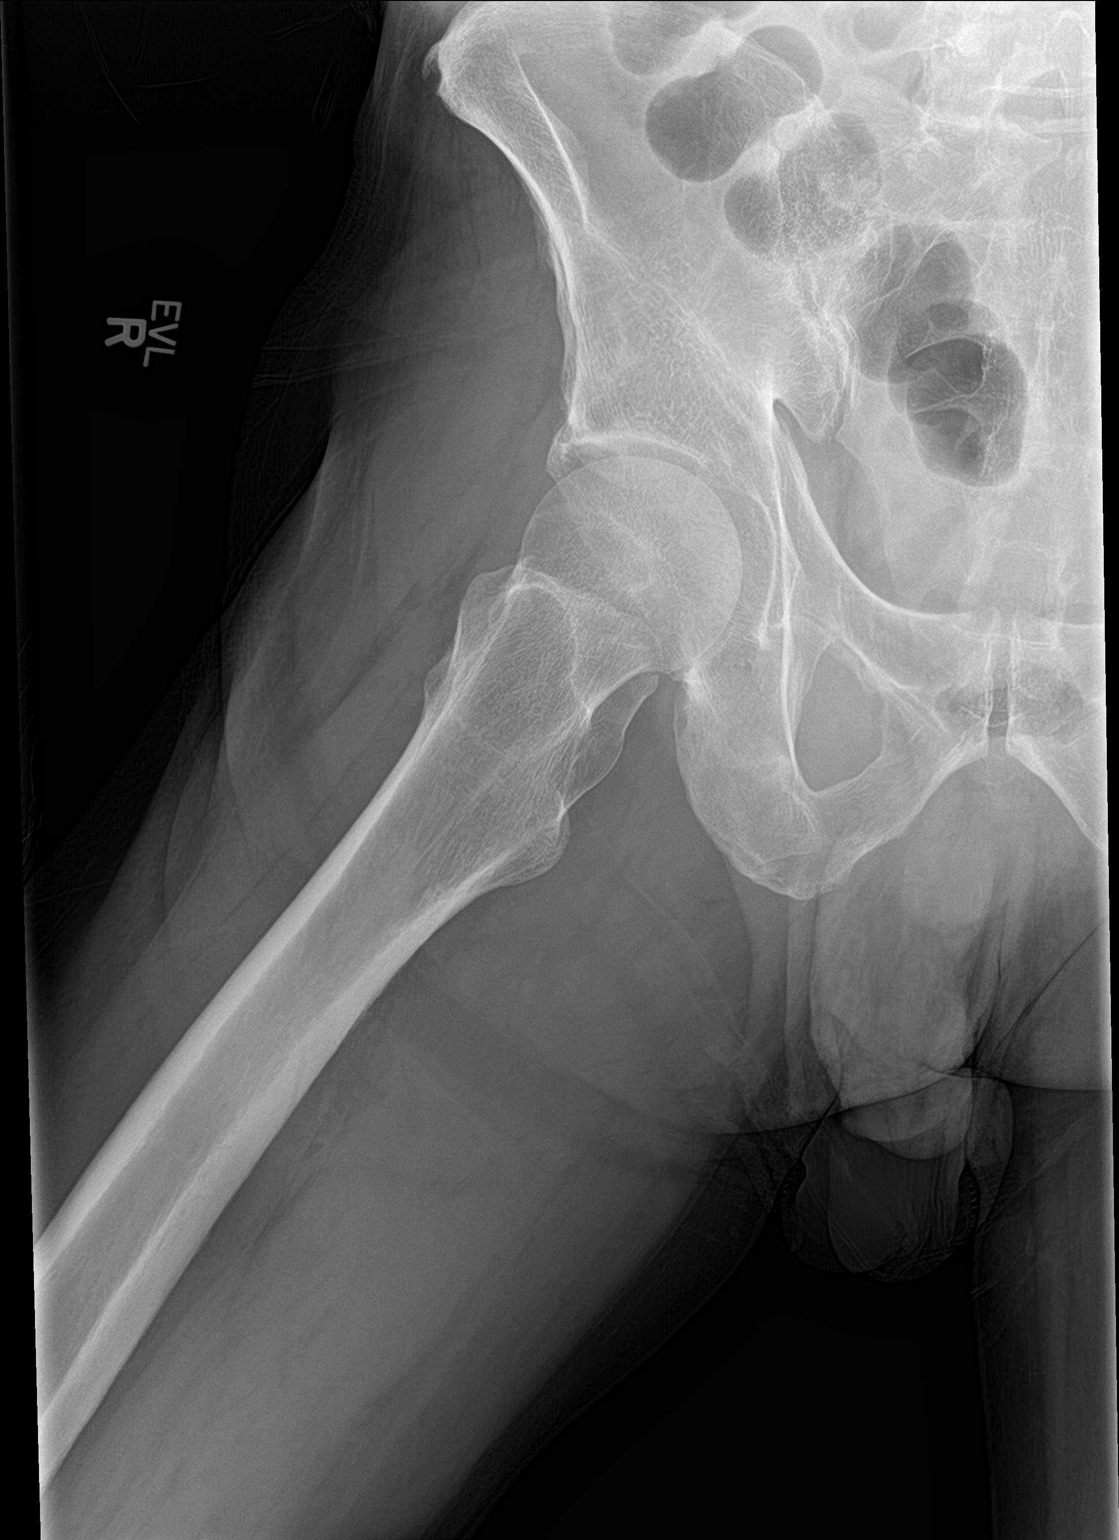

[3 of 3 positions shown; findings below may reference images not displayed]

FINDINGS: No fracture.  No bone lesion.

Hip joints are normally spaced and aligned. No significant
arthropathic change.

The SI joints and symphysis pubis are normally spaced and aligned.

Soft tissues are unremarkable.
IMPRESSION: Negative.

## 2018-03-19 DIAGNOSIS — M6281 Muscle weakness (generalized): Secondary | ICD-10-CM | POA: Diagnosis not present

## 2018-03-19 DIAGNOSIS — M25511 Pain in right shoulder: Secondary | ICD-10-CM | POA: Diagnosis not present

## 2018-03-19 DIAGNOSIS — M75101 Unspecified rotator cuff tear or rupture of right shoulder, not specified as traumatic: Secondary | ICD-10-CM | POA: Diagnosis not present

## 2018-03-19 DIAGNOSIS — M2560 Stiffness of unspecified joint, not elsewhere classified: Secondary | ICD-10-CM | POA: Diagnosis not present

## 2018-03-22 ENCOUNTER — Telehealth: Payer: Self-pay

## 2018-03-22 NOTE — Telephone Encounter (Signed)
Received PA form from [facility]- form completed and faxed to [phone]. Awaiting determination.

## 2018-03-23 NOTE — Telephone Encounter (Signed)
PA approved. Effective 02/21/2018 to 03/23/2019.

## 2018-03-28 DIAGNOSIS — M2560 Stiffness of unspecified joint, not elsewhere classified: Secondary | ICD-10-CM | POA: Diagnosis not present

## 2018-03-28 DIAGNOSIS — M25511 Pain in right shoulder: Secondary | ICD-10-CM | POA: Diagnosis not present

## 2018-03-28 DIAGNOSIS — M75101 Unspecified rotator cuff tear or rupture of right shoulder, not specified as traumatic: Secondary | ICD-10-CM | POA: Diagnosis not present

## 2018-03-28 DIAGNOSIS — M6281 Muscle weakness (generalized): Secondary | ICD-10-CM | POA: Diagnosis not present

## 2018-04-02 ENCOUNTER — Encounter: Payer: Self-pay | Admitting: Family Medicine

## 2018-04-02 DIAGNOSIS — K219 Gastro-esophageal reflux disease without esophagitis: Secondary | ICD-10-CM

## 2018-04-03 MED ORDER — FAMOTIDINE 20 MG PO TABS: ORAL_TABLET | ORAL | 3 refills | Status: DC

## 2018-04-03 NOTE — Addendum Note (Signed)
Addended by: Lamar Blinks C on: 04/03/2018 04:14 PM   Modules accepted: Orders

## 2018-04-06 ENCOUNTER — Other Ambulatory Visit: Payer: Self-pay | Admitting: Family Medicine

## 2018-04-06 DIAGNOSIS — G894 Chronic pain syndrome: Secondary | ICD-10-CM

## 2018-04-26 DIAGNOSIS — M75101 Unspecified rotator cuff tear or rupture of right shoulder, not specified as traumatic: Secondary | ICD-10-CM | POA: Diagnosis not present

## 2018-04-27 DIAGNOSIS — N3281 Overactive bladder: Secondary | ICD-10-CM | POA: Diagnosis not present

## 2018-04-27 DIAGNOSIS — N320 Bladder-neck obstruction: Secondary | ICD-10-CM | POA: Diagnosis not present

## 2018-04-27 DIAGNOSIS — R350 Frequency of micturition: Secondary | ICD-10-CM | POA: Diagnosis not present

## 2018-05-01 ENCOUNTER — Encounter: Payer: Self-pay | Admitting: Family Medicine

## 2018-05-01 NOTE — Progress Notes (Signed)
[facility] [place], [place], [place] [address] [phone]  Date:  05/03/2018   Name:  Patrick [name]   DOB:  January 20, 1951   MRN:  [id number]  PCP:  Darreld Mclean, MD    Chief Complaint: Gastroesophageal Reflux (several months, upper abdomnal pain, pepcid, zantac) and Immunizations (given flu shot in fall at work)   History of Present Illness:  Patrick [name] is a 68 y.o. very pleasant male patient who presents with the following:  I last saw this patient in July for physical He has a history of hearing loss, anxiety, sleep apnea, chronic fatigue, fibromyalgia He works for [employer] He is a former smoker-we did do a lung cancer screening CT for him in July, this looked okay  Here today with concern of indigestion He has recurrent epigastric pain We have dealt with this a couple other times, most recently in October 2018-at that time we used a PPI and Carafate, and his symptoms did improve It has been present again for a couple of months  It stays in the epigastrium It is occasionally so bad that he takes hydrocodone.  He generally has some hydrocodone on hand for his back pain, last got a prescription for me in October He is using pepcid, and omeprazole Pain is worse after he eats red sauce, and many other foods.  He is really having a hard time finding foods that he can tolerate  In 8/17 he had an upper GI, which showed gastritis  He was negative for H pylori then as well  No fever, no CP or SOB  Symptoms are nonexertional  He did have a right rotator cuff repair about 2 months ago- he was using some NSAIDS for this pain He is taking meloxicam 15 mg once a day No BC or Goody powders  He is not really taking any other NSAIDs, perhaps an occasional ibuprofen or Aleve  He is not vomiting, not bringing up any blood  Wt Readings from Last 3 Encounters:  05/03/18 186 lb (84.4 kg)  10/09/17 184 lb  (83.5 kg)  05/03/17 188 lb 3.2 oz (85.4 kg)   He has pain with really any foods He is burping    Flu shot- done  Can give Prevnar today  04/08/2018  2   12/18/2017  Clonazepam 0.5 MG Tablet  180.00 90 Je Cop   [id number]   Exp (4317)   1  2.00 LME  Comm Ins   [place]  03/01/2018  2   12/21/2017  Pregabalin 100 MG Capsule  180.00 90 Je Cop   [id number]   Exp (4317)   1  1.34 LME  Comm Ins   [place]  02/21/2018  1   02/21/2018  Oxycodone-Acetaminophen 5-325  51.00 7 Je Fel   [id number]   Wal (8139)   0  54.64 MME  Comm Ins   [place]  01/31/2018  2   12/18/2017  Clonazepam 0.5 MG Tablet  180.00 90 Je Cop   [id number]   Exp (4317)   0  2.00 LME  Comm Ins   [place]  12/23/2017  2   12/18/2017  Hydrocodone-Acetamin 5-325 MG  45.00 15 Je Cop   [id number]   Exp (4317)   0  15.00 MME  Comm Ins   [place]  12/22/2017  2   12/21/2017  Pregabalin 100 MG Capsule  180.00 [place]   [id number]  Exp (4317)   0  1.34 LME  Comm Ins   [place]  12/05/2017  1   12/05/2017  Pregabalin 75 MG Capsule  60.00 30 Je Cop   [id number]   Wal (8139)   0  1.00 LME  Comm Ins   [place]  11/06/2017  1   11/06/2017  Tramadol Hcl 50 MG Tablet  20.00 6 [address]   [id number]   Wal (8139)   0  16.67 MME  Comm Ins   [place]  10/18/2017  2   08/04/2017  Clonazepam 0.5 MG Tablet  180.00 90 Je Cop   [id number]   Exp (4317)   1  2.00 LME  Comm Ins   [place]  10/17/2017  2   10/09/2017  Hydrocodone-Acetamin 5-325 MG  21.00 7 Je Cop   [id number]   Exp (4317)   0  15.00 MME       Patient Active Problem List   Diagnosis Date Noted  . Hearing loss 12/15/2016  . Chronic fatigue 10/09/2015  . Generalized anxiety disorder 10/09/2015  . Memory loss 10/09/2015  . Obstructive sleep apnea of adult 02/09/2015  . Benign prostatic hyperplasia with urinary obstruction 11/18/2013  . Acid reflux 11/18/2013  . Degenerative arthritis of lumbar spine 11/18/2013  . Headache, migraine 11/18/2013  . External hemorrhoids with other complication 54/27/0623  . Restless leg 05/27/2009     Past Medical History:  Diagnosis Date  . Anxiety   . Arthritis   . Basal cell carcinoma   . BPH (benign prostatic hyperplasia)   . Depression   . Dysphagia   . Fibromyalgia   . GERD (gastroesophageal reflux disease)    otc  tums  . Headache    migraines  . Herpes simplex type 1 infection   . History of migraine   . Hypertension    no longer on medication  . Insomnia   . Lower back pain   . Morton's neuroma   . OSA (obstructive sleep apnea)   . Primary snoring   . Recurrent canker sores   . Urinary frequency     Past Surgical History:  Procedure Laterality Date  . EPIDIDYMECTOMY     for spermatocele  . EYE SURGERY Bilateral    lasik eye surgery  . FOOT SURGERY Left   . INGUINAL HERNIA REPAIR Left    01/25/1999  . MOUTH SURGERY    . pancreatic surgery r/t trauma    . SEPTOPLASTY N/A 02/09/2015   Procedure: SEPTOPLASTY;  Surgeon: Rozetta Nunnery, MD;  Location: [facility];  Service: ENT;  Laterality: N/A;  . SKIN CANCER EXCISION    . TONSILLECTOMY    . TRANSURETHRAL INCISION OF PROSTATE    . TURBINATE REDUCTION Bilateral 02/09/2015   Procedure: BILATERAL TURBINATE REDUCTION;  Surgeon: Rozetta Nunnery, MD;  Location: [facility];  Service: ENT;  Laterality: Bilateral;  . UVULOPALATOPHARYNGOPLASTY N/A 02/09/2015   Procedure: UVULOPALATOPHARYNGOPLASTY (UPPP);  Surgeon: Rozetta Nunnery, MD;  Location: [facility];  Service: ENT;  Laterality: N/A;    Social History   Tobacco Use  . Smoking status: Former Smoker    Packs/day: 2.00    Years: 30.00    Pack years: 60.00    Types: Cigarettes    Last attempt to quit: 10/29/2005    Years since quitting: 12.5  . Smokeless tobacco: Never Used  Substance Use Topics  . Alcohol use: Yes    Alcohol/week: 0.0 standard drinks    Comment: Rare  . Drug  use: No    Family History  Problem Relation Age of Onset  . Coronary artery disease Mother   . Stroke Mother   . Hypertension Father   . Anxiety disorder Brother   .  Skin cancer Brother     No Known Allergies  Medication list has been reviewed and updated.  Current Outpatient Medications on File Prior to Visit  Medication Sig Dispense Refill  . benzonatate (TESSALON) 100 MG capsule Take 1 capsule (100 mg total) by mouth 3 (three) times daily as needed for cough. 90 capsule 3  . betamethasone dipropionate (DIPROLENE) 0.05 % cream Apply topically 2 (two) times daily. Use as needed 30 g 3  . Butalbital-APAP-Caff-Cod 50-300-40-30 MG CAPS Take 1-2 caps every 6 hours as needed for headache.  Max 6/24 hours 30 capsule 0  . Cholecalciferol (VITAMIN D3) 2000 UNITS TABS Take 2,000 Units by mouth daily.    . clonazePAM (KLONOPIN) 0.5 MG tablet Take 1 or 2 at bedtime as needed for sleep 180 tablet 1  . famotidine (PEPCID) 20 MG tablet Take once or twice a day as needed to control GERD 180 tablet 3  . fluticasone (FLONASE) 50 MCG/ACT nasal spray Place 2 sprays into both nostrils daily. 43 g 3  . HYDROcodone-acetaminophen (NORCO/VICODIN) 5-325 MG tablet Take 1 tablet by mouth every 8 (eight) hours as needed for moderate pain. 45 tablet 0  . LYRICA 100 MG capsule Take 2 capsules (200 mg total) by mouth at bedtime. 180 capsule 3  . Melatonin 5 MG TABS Take 5 mg by mouth at bedtime.    . meloxicam (MOBIC) 15 MG tablet TAKE 1 TABLET DAILY 90 tablet 1  . methylphenidate (RITALIN) 20 MG tablet Take 1 tablet (20 mg total) by mouth 2 (two) times daily. 180 tablet 0  . pregabalin (LYRICA) 100 MG capsule Take 2 capsules (200 mg total) by mouth at bedtime. 180 capsule 1  . propranolol (INDERAL) 10 MG tablet TAKE 1 TABLET EVERY 12 HOURS AS NEEDED FOR TREMOR 180 tablet 4  . sertraline (ZOLOFT) 100 MG tablet Take 2 tablets (200 mg total) by mouth daily. 180 tablet 3  . sucralfate (CARAFATE) 1 GM/10ML suspension Take 10 mLs (1 g total) by mouth 4 (four) times daily -  with meals and at bedtime. 420 mL 0  . traZODone (DESYREL) 100 MG tablet Take 1/2 or 1 tablet (50 or 100 mg) at  bedtime as needed for sleep 90 tablet 3  . vitamin B-12 (CYANOCOBALAMIN) 500 MCG tablet Take 500 mcg by mouth daily.     No current facility-administered medications on file prior to visit.     Review of Systems:  As per HPI- otherwise negative. No fever, no rash  Physical Examination: Vitals:   05/03/18 1450  BP: 130/90  Pulse: 74  Resp: 16  SpO2: 99%   Vitals:   05/03/18 1450  Weight: 186 lb (84.4 kg)  Height: 6' (1.829 m)   Body mass index is 25.23 kg/m. Ideal Body Weight: Weight in (lb) to have BMI = 25: 183.9  GEN: WDWN, NAD, Non-toxic, A & O x 3, normal weight, looks well HEENT: Atraumatic, Normocephalic. Neck supple. No masses, No LAD. Ears and Nose: No external deformity. CV: RRR, No M/G/R. No JVD. No thrill. No extra heart sounds. PULM: CTA B, no wheezes, crackles, rhonchi. No retractions. No resp. distress. No accessory muscle use. ABD: S,  ND, +BS. No rebound. No HSM.  Abdomen is soft and benign, except for tender  area in epigastum just to the left of the xiphoid process.  [name] is not sure if his ribs may actually be sore to pressure as well. EXTR: No c/c/e NEURO Normal gait.  PSYCH: Normally interactive. Conversant. Not depressed or anxious appearing.  Calm demeanor. '  Assessment and Plan: Epigastric pain - Plan: DG Chest 2 View, sucralfate (CARAFATE) 1 g tablet, omeprazole (PRILOSEC) 40 MG capsule  Immunization due - Plan: Pneumococcal conjugate vaccine 13-valent IM  Sciatica, unspecified laterality - Plan: HYDROcodone-acetaminophen (NORCO/VICODIN) 5-325 MG tablet  Osteoarthritis of lumbar spine, unspecified spinal osteoarthritis complication status - Plan: HYDROcodone-acetaminophen (NORCO/VICODIN) 5-325 MG tablet  Here with likely exacerbation of recurrent gastritis.  He has been taking NSAIDs for a recent shoulder operation, advised him to stop all NSAIDs immediately.  I did refill his hydrocodone which he may use for pain instead. Increase omeprazole to  40 mg, added Carafate. I have encouraged him to go on a very bland diet until he is feeling better As his symptoms seem to also encompass rib tenderness, will get a chest x-ray I have advised him regarding signs and symptoms of a ruptured ulcer, and when to seek emergency care  Signed Lamar Blinks, MD  Received his rib films as below, message to patient Dg Chest 2 View  Result Date: 05/03/2018 CLINICAL DATA:  Anterior chest pain. EXAM: CHEST - 2 VIEW COMPARISON:  Chest CT 10/10/2017 and chest x-ray 02/16/2017 FINDINGS: The cardiac silhouette, mediastinal and hilar contours are normal. There is mild tortuosity of the thoracic aorta. The lungs are clear. No pleural effusions. The bony thorax is intact. IMPRESSION: No acute cardiopulmonary findings. No change since prior study from 2018. Electronically Signed   By: Marijo Sanes M.D.   On: 05/03/2018 15:39   Meds ordered this encounter  Medications  . sucralfate (CARAFATE) 1 g tablet    Sig: Take 1 tablet (1 g total) by mouth 4 (four) times daily -  with meals and at bedtime.    Dispense:  60 tablet    Refill:  0  . omeprazole (PRILOSEC) 40 MG capsule    Sig: Take 1 capsule (40 mg total) by mouth daily.    Dispense:  30 capsule    Refill:  3  . HYDROcodone-acetaminophen (NORCO/VICODIN) 5-325 MG tablet    Sig: Take 1 tablet by mouth every 8 (eight) hours as needed for moderate pain.    Dispense:  45 tablet    Refill:  0

## 2018-05-03 ENCOUNTER — Encounter: Payer: Self-pay | Admitting: Family Medicine

## 2018-05-03 ENCOUNTER — Ambulatory Visit: Payer: BLUE CROSS/BLUE SHIELD | Admitting: Family Medicine

## 2018-05-03 ENCOUNTER — Ambulatory Visit (HOSPITAL_BASED_OUTPATIENT_CLINIC_OR_DEPARTMENT_OTHER)
Admission: RE | Admit: 2018-05-03 | Discharge: 2018-05-03 | Disposition: A | Discharge status: Home / Self Care | Payer: BLUE CROSS/BLUE SHIELD | Source: Ambulatory Visit | Attending: Family Medicine | Admitting: Family Medicine

## 2018-05-03 VITALS — BP 130/90 | HR 74 | Resp 16 | Ht 72.0 in | Wt 186.0 lb

## 2018-05-03 DIAGNOSIS — Z23 Encounter for immunization: Secondary | ICD-10-CM | POA: Diagnosis not present

## 2018-05-03 DIAGNOSIS — R1013 Epigastric pain: Secondary | ICD-10-CM | POA: Insufficient documentation

## 2018-05-03 DIAGNOSIS — M47816 Spondylosis without myelopathy or radiculopathy, lumbar region: Secondary | ICD-10-CM

## 2018-05-03 DIAGNOSIS — M5430 Sciatica, unspecified side: Secondary | ICD-10-CM

## 2018-05-03 DIAGNOSIS — R079 Chest pain, unspecified: Secondary | ICD-10-CM | POA: Diagnosis not present

## 2018-05-03 MED ORDER — HYDROCODONE-ACETAMINOPHEN 5-325 MG PO TABS: 1.0000 | ORAL_TABLET | Freq: Three times a day (TID) | ORAL | 0 refills | Status: DC | PRN

## 2018-05-03 MED ORDER — OMEPRAZOLE 40 MG PO CPDR: 40.0000 mg | DELAYED_RELEASE_CAPSULE | Freq: Every day | ORAL | 3 refills | Status: DC

## 2018-05-03 MED ORDER — SUCRALFATE 1 G PO TABS: 1.0000 g | ORAL_TABLET | Freq: Three times a day (TID) | ORAL | 0 refills | Status: DC

## 2018-05-03 NOTE — Patient Instructions (Signed)
It was good to see you today, I am sorry you are having hard time with your stomach. Please stop taking meloxicam, you can use hydrocodone instead as needed for pain Avoid any Aleve or ibuprofen over-the-counter  We will add Carafate to protect your stomach, take this with meals and at bedtime (4 times a day).  Please use this for 10 to 15 days I also gave you omeprazole 40 mg. Avoid any foods that seem to irritate your stomach  Please let me know if you are not feeling better in the next few days Occasionally a stomach ulcer can actually rupture, which is a dangerous event.  If you are having escalating pain, or start vomiting blood or what appears to be coffee grounds, please seek care immediately  We will get an x-ray today to make sure no rib issue

## 2018-05-25 ENCOUNTER — Other Ambulatory Visit: Payer: Self-pay

## 2018-05-25 DIAGNOSIS — R1013 Epigastric pain: Secondary | ICD-10-CM

## 2018-05-25 MED ORDER — SUCRALFATE 1 G PO TABS: 1.0000 g | ORAL_TABLET | Freq: Three times a day (TID) | ORAL | 5 refills | Status: DC

## 2018-06-04 ENCOUNTER — Encounter: Payer: Self-pay | Admitting: Family Medicine

## 2018-06-04 DIAGNOSIS — G894 Chronic pain syndrome: Secondary | ICD-10-CM

## 2018-06-04 MED ORDER — PREGABALIN 100 MG PO CAPS: 200.0000 mg | ORAL_CAPSULE | Freq: Every day | ORAL | 3 refills | Status: DC

## 2018-06-18 ENCOUNTER — Encounter: Payer: Self-pay | Admitting: Family Medicine

## 2018-06-20 ENCOUNTER — Ambulatory Visit (HOSPITAL_BASED_OUTPATIENT_CLINIC_OR_DEPARTMENT_OTHER): Admission: RE | Admit: 2018-06-20 | Payer: BLUE CROSS/BLUE SHIELD | Source: Ambulatory Visit

## 2018-06-20 ENCOUNTER — Encounter (HOSPITAL_BASED_OUTPATIENT_CLINIC_OR_DEPARTMENT_OTHER): Payer: Self-pay

## 2018-06-20 ENCOUNTER — Ambulatory Visit: Payer: BLUE CROSS/BLUE SHIELD | Admitting: Internal Medicine

## 2018-06-20 ENCOUNTER — Encounter: Payer: Self-pay | Admitting: Internal Medicine

## 2018-06-20 ENCOUNTER — Other Ambulatory Visit: Payer: Self-pay

## 2018-06-20 ENCOUNTER — Ambulatory Visit (HOSPITAL_BASED_OUTPATIENT_CLINIC_OR_DEPARTMENT_OTHER)
Admission: RE | Admit: 2018-06-20 | Discharge: 2018-06-20 | Disposition: A | Discharge status: Home / Self Care | Payer: BLUE CROSS/BLUE SHIELD | Source: Ambulatory Visit | Attending: Internal Medicine | Admitting: Internal Medicine

## 2018-06-20 ENCOUNTER — Ambulatory Visit: Payer: BLUE CROSS/BLUE SHIELD | Admitting: Family Medicine

## 2018-06-20 ENCOUNTER — Telehealth: Payer: Self-pay

## 2018-06-20 ENCOUNTER — Encounter: Payer: Self-pay | Admitting: Family Medicine

## 2018-06-20 VITALS — BP 127/71 | HR 87 | Temp 97.4°F | Resp 16 | Ht 72.0 in | Wt 179.0 lb

## 2018-06-20 DIAGNOSIS — N433 Hydrocele, unspecified: Secondary | ICD-10-CM | POA: Diagnosis not present

## 2018-06-20 DIAGNOSIS — N50811 Right testicular pain: Secondary | ICD-10-CM

## 2018-06-20 LAB — URINALYSIS, ROUTINE W REFLEX MICROSCOPIC
Bilirubin Urine: NEGATIVE
Hgb urine dipstick: NEGATIVE
Ketones, ur: NEGATIVE
Leukocytes,Ua: NEGATIVE
Nitrite: NEGATIVE
RBC / HPF: NONE SEEN (ref 0–?)
Specific Gravity, Urine: 1.02 (ref 1.000–1.030)
Total Protein, Urine: NEGATIVE
Urine Glucose: NEGATIVE
Urobilinogen, UA: 0.2 (ref 0.0–1.0)
pH: 6.5 (ref 5.0–8.0)

## 2018-06-20 MED ORDER — SULFAMETHOXAZOLE-TRIMETHOPRIM 800-160 MG PO TABS: 1.0000 | ORAL_TABLET | Freq: Two times a day (BID) | ORAL | 0 refills | Status: DC

## 2018-06-20 NOTE — Telephone Encounter (Signed)
-----   Message from Darreld Mclean, MD sent at 06/20/2018  6:19 AM EDT ----- Elease Etienne- I was looking at his reason for visit and wonder if he does need to be seen in person.  Are any of the male providers seeing people in clinic today?  Wondered if one of they might want to see him. I hate to cancel a web visit but this may need to come in

## 2018-06-20 NOTE — Progress Notes (Signed)
Subjective:    Patient ID: Patrick [name], male    DOB: May 20, 1950, 68 y.o.   MRN: [id number]  DOS:  06/20/2018 Type of visit - description: Acute visit, seen face-to-face. The patient has a long history of urological issues, reports right testicular discomfort on and off for 2 months, worse in the last week. The pain is sometimes severe enough that he needs to take hydrocodone. Denies any swelling or injury.  Review of Systems Denies fever chills or respiratory symptoms No nausea, vomiting, diarrhea No dysuria, gross hematuria.  No difficulty urinating or urinary frequency  Past Medical History:  Diagnosis Date  . Anxiety   . Arthritis   . Basal cell carcinoma   . BPH (benign prostatic hyperplasia)   . Depression   . Dysphagia   . Fibromyalgia   . GERD (gastroesophageal reflux disease)    otc  tums  . Headache    migraines  . Herpes simplex type 1 infection   . History of migraine   . Hypertension    no longer on medication  . Insomnia   . Lower back pain   . Morton's neuroma   . OSA (obstructive sleep apnea)   . Primary snoring   . Recurrent canker sores   . Urinary frequency     Past Surgical History:  Procedure Laterality Date  . EPIDIDYMECTOMY     for spermatocele  . EYE SURGERY Bilateral    lasik eye surgery  . FOOT SURGERY Left   . INGUINAL HERNIA REPAIR Left    01/25/1999  . MOUTH SURGERY    . pancreatic surgery r/t trauma    . SEPTOPLASTY N/A 02/09/2015   Procedure: SEPTOPLASTY;  Surgeon: Rozetta Nunnery, MD;  Location: [facility];  Service: ENT;  Laterality: N/A;  . SKIN CANCER EXCISION    . TONSILLECTOMY    . TRANSURETHRAL INCISION OF PROSTATE    . TURBINATE REDUCTION Bilateral 02/09/2015   Procedure: BILATERAL TURBINATE REDUCTION;  Surgeon: Rozetta Nunnery, MD;  Location: [facility];  Service: ENT;  Laterality: Bilateral;  . UVULOPALATOPHARYNGOPLASTY N/A 02/09/2015   Procedure: UVULOPALATOPHARYNGOPLASTY (UPPP);  Surgeon: Rozetta Nunnery, MD;  Location: [facility];  Service: ENT;  Laterality: N/A;    Social History   Socioeconomic History  . Marital status: Married    Spouse name: Not on file  . Number of children: 2  . Years of education: BS   . Highest education level: Not on file  Occupational History  . Occupation: Chief Financial Officer  Social Needs  . Financial resource strain: Not on file  . Food insecurity:    Worry: Not on file    Inability: Not on file  . Transportation needs:    Medical: Not on file    Non-medical: Not on file  Tobacco Use  . Smoking status: Former Smoker    Packs/day: 2.00    Years: 30.00    Pack years: 60.00    Types: Cigarettes    Last attempt to quit: 10/29/2005    Years since quitting: 12.6  . Smokeless tobacco: Never Used  Substance and Sexual Activity  . Alcohol use: Yes    Alcohol/week: 0.0 standard drinks    Comment: Rare  . Drug use: No  . Sexual activity: Not on file  Lifestyle  . Physical activity:    Days per week: Not on file    Minutes per session: Not on file  . Stress: Not on file  Relationships  . Social connections:  Talks on phone: Not on file    Gets together: Not on file    Attends religious service: Not on file    Active member of club or organization: Not on file    Attends meetings of clubs or organizations: Not on file    Relationship status: Not on file  . Intimate partner violence:    Fear of current or ex partner: Not on file    Emotionally abused: Not on file    Physically abused: Not on file    Forced sexual activity: Not on file  Other Topics Concern  . Not on file  Social History Narrative   Denies caffeine use       Allergies as of 06/20/2018   No Known Allergies     Medication List       Accurate as of June 20, 2018  5:35 PM. Always use your most recent med list.        betamethasone dipropionate 0.05 % cream Commonly known as:  DIPROLENE Apply topically 2 (two) times daily. Use as needed   Butalbital-APAP-Caff-Cod  50-300-40-30 MG Caps Take 1-2 caps every 6 hours as needed for headache.  Max 6/24 hours   clonazePAM 0.5 MG tablet Commonly known as:  KLONOPIN Take 1 or 2 at bedtime as needed for sleep   famotidine 20 MG tablet Commonly known as:  PEPCID Take once or twice a day as needed to control GERD   fluticasone 50 MCG/ACT nasal spray Commonly known as:  FLONASE Place 2 sprays into both nostrils daily.   HYDROcodone-acetaminophen 5-325 MG tablet Commonly known as:  NORCO/VICODIN Take 1 tablet by mouth every 8 (eight) hours as needed for moderate pain.   Melatonin 5 MG Tabs Take 5 mg by mouth at bedtime.   meloxicam 15 MG tablet Commonly known as:  MOBIC TAKE 1 TABLET DAILY   methylphenidate 20 MG tablet Commonly known as:  RITALIN Take 1 tablet (20 mg total) by mouth 2 (two) times daily.   omeprazole 40 MG capsule Commonly known as:  PRILOSEC Take 1 capsule (40 mg total) by mouth daily.   pregabalin 100 MG capsule Commonly known as:  Lyrica Take 2 capsules (200 mg total) by mouth at bedtime.   propranolol 10 MG tablet Commonly known as:  INDERAL TAKE 1 TABLET EVERY 12 HOURS AS NEEDED FOR TREMOR   sertraline 100 MG tablet Commonly known as:  ZOLOFT Take 2 tablets (200 mg total) by mouth daily.   sucralfate 1 GM/10ML suspension Commonly known as:  CARAFATE Take 10 mLs (1 g total) by mouth 4 (four) times daily -  with meals and at bedtime.   sucralfate 1 g tablet Commonly known as:  CARAFATE Take 1 tablet (1 g total) by mouth 4 (four) times daily -  with meals and at bedtime.   sulfamethoxazole-trimethoprim 800-160 MG tablet Commonly known as:  Bactrim DS Take 1 tablet by mouth 2 (two) times daily.   traZODone 100 MG tablet Commonly known as:  DESYREL Take 1/2 or 1 tablet (50 or 100 mg) at bedtime as needed for sleep   vitamin B-12 500 MCG tablet Commonly known as:  CYANOCOBALAMIN Take 500 mcg by mouth daily.   Vitamin D3 50 MCG (2000 UT) Tabs Take 2,000 Units  by mouth daily.           Objective:   Physical Exam BP 127/71 (BP Location: Left Arm, Patient Position: Sitting, Cuff Size: Small)   Pulse 87   Temp Marland Kitchen)  97.4 F (36.3 C) (Oral)   Resp 16   Ht 6' (1.829 m)   Wt 179 lb (81.2 kg)   SpO2 98%   BMI 24.28 kg/m  General:   Well developed, NAD, BMI noted.  HEENT:  Normocephalic . Face symmetric, atraumatic Abdomen:  Not distended, soft, non-tender. No rebound or rigidity. GU: Normal penis with no rash or discharge Left testicle: hangs low, proximal epididymal area slightly enlarged but not tender Right testicle: Minimal TTP, no nodules.  Epididymis not enlarged . Skin: Not pale. Not jaundice Neurologic:  alert & oriented X3.  Speech normal, gait appropriate for age and unassisted Psych--  Cognition and judgment appear intact.  Cooperative with normal attention span and concentration.  Behavior appropriate. No anxious or depressed appearing.     Assessment     68 year old gentleman, history of PMH includes hearing loss, DJD,  chronic fatigue, anxiety, sleep apnea. GU PMH incledes BPH, OAB and surgeries:  EPIDIDYMECTOMY Right 01/25/1999  P for spermatocele   INGUINAL HERNIA REPAIR Left 01/25/1999   LASER OF PROSTATE W/ GREEN LIGHT PVP 03/13/2007   TURP VAPORIZATION 05/25/2012   Presents with: Right testicular pain: This is going on for 2 months, worse for 1 week. Exam is essentially benign except for mild right testicular TTP. No other GU symptoms. Etiology unclear, orchitis?  On and off torsion? Plan: UA, urine culture, ultrasound, ER if symptoms severe. Consider empiric antibiotics. Addendum: Ultrasound report reviewed, see below, some R  testicular swelling thus possibly has orchitis.  Results discussed with the patient, we agreed on Bactrim DS twice a day for 10 days, avoid excessive sun exposure while on antibiotics, follow-up with PCP in 4 weeks.  He is aware that radiology is recommending to recheck ultrasound.   Will let PCP know on.  1. Heterogeneous echogenicity of the right testicle, most evident along the inferior pole. There is a questionable discrete lesion in the inferior pole, but no definite mass. Appearance of the right testicle is most suggestive edema. This could reflect inflammation, although there is no hypervascularity. Recommend short-term follow-up evaluation to reassess the right testicle to exclude a mass, with repeat ultrasound in 2-3 months. 2. Right epididymis not visualized. Most likely this was the epididymis that was previously resected. 3. Moderate mildly complicated left hydrocele. Normal appearance of the left testicle. 4. No testicular torsion.

## 2018-06-20 NOTE — Telephone Encounter (Signed)
Called patient, offered him an appointment with Dr. Larose Kells today he declined and said he could not come anywhere until Friday. He would like to still see you over webex today and if after seeing and getting your opinion he needs to come in later this week then he will but he can not today.

## 2018-06-20 NOTE — Patient Instructions (Signed)
GO TO THE LAB : Provide a urine sample   STOP BY THE FIRST FLOOR:  get a ultrasound  Go to the urgent care if your symptoms are severe, you have fever chills

## 2018-06-20 NOTE — Progress Notes (Signed)
dPre visit review using our clinic review tool, if applicable. No additional management support is needed unless otherwise documented below in the visit note.

## 2018-06-21 LAB — URINE CULTURE
MICRO NUMBER:: 383329
Result:: NO GROWTH
SPECIMEN QUALITY:: ADEQUATE

## 2018-06-22 ENCOUNTER — Other Ambulatory Visit: Payer: Self-pay | Admitting: Family Medicine

## 2018-06-22 DIAGNOSIS — R5382 Chronic fatigue, unspecified: Secondary | ICD-10-CM

## 2018-06-25 ENCOUNTER — Encounter: Payer: Self-pay | Admitting: Family Medicine

## 2018-06-25 MED ORDER — METHYLPHENIDATE HCL 20 MG PO TABS: 20.0000 mg | ORAL_TABLET | Freq: Two times a day (BID) | ORAL | 0 refills | Status: DC

## 2018-07-10 DIAGNOSIS — L821 Other seborrheic keratosis: Secondary | ICD-10-CM | POA: Diagnosis not present

## 2018-07-10 DIAGNOSIS — Z85828 Personal history of other malignant neoplasm of skin: Secondary | ICD-10-CM | POA: Diagnosis not present

## 2018-07-10 DIAGNOSIS — L570 Actinic keratosis: Secondary | ICD-10-CM | POA: Diagnosis not present

## 2018-07-10 DIAGNOSIS — Z08 Encounter for follow-up examination after completed treatment for malignant neoplasm: Secondary | ICD-10-CM | POA: Diagnosis not present

## 2018-07-19 DIAGNOSIS — M75101 Unspecified rotator cuff tear or rupture of right shoulder, not specified as traumatic: Secondary | ICD-10-CM | POA: Diagnosis not present

## 2018-07-20 DIAGNOSIS — R8271 Bacteriuria: Secondary | ICD-10-CM | POA: Diagnosis not present

## 2018-07-20 DIAGNOSIS — R3912 Poor urinary stream: Secondary | ICD-10-CM | POA: Diagnosis not present

## 2018-07-20 DIAGNOSIS — R350 Frequency of micturition: Secondary | ICD-10-CM | POA: Diagnosis not present

## 2018-07-20 DIAGNOSIS — R351 Nocturia: Secondary | ICD-10-CM | POA: Diagnosis not present

## 2018-07-31 ENCOUNTER — Other Ambulatory Visit: Payer: Self-pay

## 2018-07-31 DIAGNOSIS — R1013 Epigastric pain: Secondary | ICD-10-CM

## 2018-07-31 MED ORDER — OMEPRAZOLE 40 MG PO CPDR: 40.0000 mg | DELAYED_RELEASE_CAPSULE | Freq: Every day | ORAL | 3 refills | Status: DC

## 2018-08-03 ENCOUNTER — Other Ambulatory Visit: Payer: Self-pay

## 2018-08-03 DIAGNOSIS — F411 Generalized anxiety disorder: Secondary | ICD-10-CM

## 2018-08-03 MED ORDER — CLONAZEPAM 0.5 MG PO TABS: ORAL_TABLET | ORAL | 1 refills | Status: DC

## 2018-09-03 ENCOUNTER — Encounter: Payer: Self-pay | Admitting: Family Medicine

## 2018-09-03 DIAGNOSIS — M5430 Sciatica, unspecified side: Secondary | ICD-10-CM

## 2018-09-03 DIAGNOSIS — M47816 Spondylosis without myelopathy or radiculopathy, lumbar region: Secondary | ICD-10-CM

## 2018-09-03 DIAGNOSIS — R1013 Epigastric pain: Secondary | ICD-10-CM

## 2018-09-04 MED ORDER — BUTALBITAL-APAP-CAFF-COD 50-300-40-30 MG PO CAPS: ORAL_CAPSULE | ORAL | 0 refills | Status: DC

## 2018-09-05 MED ORDER — HYDROCODONE-ACETAMINOPHEN 5-325 MG PO TABS: 1.0000 | ORAL_TABLET | Freq: Three times a day (TID) | ORAL | 0 refills | Status: DC | PRN

## 2018-09-05 MED ORDER — SUCRALFATE 1 G PO TABS: 1.0000 g | ORAL_TABLET | Freq: Three times a day (TID) | ORAL | 5 refills | Status: DC

## 2018-09-05 NOTE — Addendum Note (Signed)
Addended by: Lamar Blinks C on: 09/05/2018 12:45 PM   Modules accepted: Orders

## 2018-09-06 ENCOUNTER — Encounter: Payer: Self-pay | Admitting: Family Medicine

## 2018-09-07 ENCOUNTER — Other Ambulatory Visit: Payer: Self-pay

## 2018-09-12 ENCOUNTER — Encounter: Payer: Self-pay | Admitting: Family Medicine

## 2018-09-18 ENCOUNTER — Encounter: Payer: Self-pay | Admitting: Family Medicine

## 2018-10-02 ENCOUNTER — Encounter: Payer: Self-pay | Admitting: Family Medicine

## 2018-10-02 DIAGNOSIS — Z720 Tobacco use: Secondary | ICD-10-CM

## 2018-10-03 ENCOUNTER — Other Ambulatory Visit: Payer: Self-pay | Admitting: Family Medicine

## 2018-10-03 DIAGNOSIS — G894 Chronic pain syndrome: Secondary | ICD-10-CM

## 2018-10-15 ENCOUNTER — Ambulatory Visit (HOSPITAL_BASED_OUTPATIENT_CLINIC_OR_DEPARTMENT_OTHER)
Admission: RE | Admit: 2018-10-15 | Discharge: 2018-10-15 | Disposition: A | Discharge status: Home / Self Care | Payer: BC Managed Care – PPO | Source: Ambulatory Visit | Attending: Family Medicine | Admitting: Family Medicine

## 2018-10-15 ENCOUNTER — Other Ambulatory Visit: Payer: Self-pay

## 2018-10-15 DIAGNOSIS — Z87891 Personal history of nicotine dependence: Secondary | ICD-10-CM | POA: Diagnosis not present

## 2018-10-15 DIAGNOSIS — Z720 Tobacco use: Secondary | ICD-10-CM | POA: Diagnosis not present

## 2018-10-17 ENCOUNTER — Encounter: Payer: Self-pay | Admitting: Family Medicine

## 2018-10-17 DIAGNOSIS — Z720 Tobacco use: Secondary | ICD-10-CM

## 2018-10-22 ENCOUNTER — Encounter: Payer: Self-pay | Admitting: Family Medicine

## 2018-10-22 DIAGNOSIS — R5382 Chronic fatigue, unspecified: Secondary | ICD-10-CM

## 2018-10-22 MED ORDER — METHYLPHENIDATE HCL 20 MG PO TABS: 20.0000 mg | ORAL_TABLET | Freq: Two times a day (BID) | ORAL | 0 refills | Status: DC

## 2018-11-06 ENCOUNTER — Encounter: Payer: Self-pay | Admitting: Family Medicine

## 2018-11-06 DIAGNOSIS — R059 Cough, unspecified: Secondary | ICD-10-CM

## 2018-11-06 DIAGNOSIS — R05 Cough: Secondary | ICD-10-CM

## 2018-11-06 MED ORDER — ALBUTEROL SULFATE HFA 108 (90 BASE) MCG/ACT IN AERS: 2.0000 | INHALATION_SPRAY | Freq: Four times a day (QID) | RESPIRATORY_TRACT | 2 refills | Status: DC | PRN

## 2018-11-06 NOTE — Addendum Note (Signed)
Addended by: Lamar Blinks C on: 11/06/2018 03:54 PM   Modules accepted: Orders

## 2018-11-15 ENCOUNTER — Encounter: Payer: Self-pay | Admitting: Family Medicine

## 2018-11-16 DIAGNOSIS — H90A31 Mixed conductive and sensorineural hearing loss, unilateral, right ear with restricted hearing on the contralateral side: Secondary | ICD-10-CM | POA: Diagnosis not present

## 2018-11-26 ENCOUNTER — Encounter: Payer: Self-pay | Admitting: Family Medicine

## 2018-11-26 DIAGNOSIS — M25522 Pain in left elbow: Secondary | ICD-10-CM

## 2018-11-27 ENCOUNTER — Other Ambulatory Visit: Payer: Self-pay

## 2018-11-27 ENCOUNTER — Ambulatory Visit (HOSPITAL_BASED_OUTPATIENT_CLINIC_OR_DEPARTMENT_OTHER)
Admission: RE | Admit: 2018-11-27 | Discharge: 2018-11-27 | Disposition: A | Discharge status: Home / Self Care | Payer: BC Managed Care – PPO | Source: Ambulatory Visit | Attending: Family Medicine | Admitting: Family Medicine

## 2018-11-27 DIAGNOSIS — M25522 Pain in left elbow: Secondary | ICD-10-CM | POA: Insufficient documentation

## 2018-11-27 NOTE — Patient Instructions (Addendum)
It was good to see you today, I will be in touch with your labs ASAP Let me know when you need refills sent to [facility] and I am glad to send them for you  I made you an appt with [facility] ortho at [facility] [address] [facility] office Address: [address], [place], [place] [address] Phone: [phone]  Appt Friday at 1pm with [facility]  For your irritable airway, I would first try taking a daily non sedating antihistamine such as claritin or zyrtec, if not helpful try Allegra.  If still not helpful we can try Singulair which is an rx medication    Health Maintenance After Age 59 After age 28, you are at a higher risk for certain long-term diseases and infections as well as injuries from falls. Falls are a major cause of broken bones and head injuries in people who are older than age 5. Getting regular preventive care can help to keep you healthy and well. Preventive care includes getting regular testing and making lifestyle changes as recommended by your health care provider. Talk with your health care provider about:  Which screenings and tests you should have. A screening is a test that checks for a disease when you have no symptoms.  A diet and exercise plan that is right for you. What should I know about screenings and tests to prevent falls? Screening and testing are the best ways to find a health problem early. Early diagnosis and treatment give you the best chance of managing medical conditions that are common after age 8. Certain conditions and lifestyle choices may make you more likely to have a fall. Your health care provider may recommend:  Regular vision checks. Poor vision and conditions such as cataracts can make you more likely to have a fall. If you wear glasses, make sure to get your prescription updated if your vision changes.  Medicine review. Work with your health care provider to regularly review all of the medicines you are taking, including over-the-counter  medicines. Ask your health care provider about any side effects that may make you more likely to have a fall. Tell your health care provider if any medicines that you take make you feel dizzy or sleepy.  Osteoporosis screening. Osteoporosis is a condition that causes the bones to get weaker. This can make the bones weak and cause them to break more easily.  Blood pressure screening. Blood pressure changes and medicines to control blood pressure can make you feel dizzy.  Strength and balance checks. Your health care provider may recommend certain tests to check your strength and balance while standing, walking, or changing positions.  Foot health exam. Foot pain and numbness, as well as not wearing proper footwear, can make you more likely to have a fall.  Depression screening. You may be more likely to have a fall if you have a fear of falling, feel emotionally low, or feel unable to do activities that you used to do.  Alcohol use screening. Using too much alcohol can affect your balance and may make you more likely to have a fall. What actions can I take to lower my risk of falls? General instructions  Talk with your health care provider about your risks for falling. Tell your health care provider if: ? You fall. Be sure to tell your health care provider about all falls, even ones that seem minor. ? You feel dizzy, sleepy, or off-balance.  Take over-the-counter and prescription medicines  only as told by your health care provider. These include any supplements.  Eat a healthy diet and maintain a healthy weight. A healthy diet includes low-fat dairy products, low-fat (lean) meats, and fiber from whole grains, beans, and lots of fruits and vegetables. Home safety  Remove any tripping hazards, such as rugs, cords, and clutter.  Install safety equipment such as grab bars in bathrooms and safety rails on stairs.  Keep rooms and walkways well-lit. Activity   Follow a regular exercise  program to stay fit. This will help you maintain your balance. Ask your health care provider what types of exercise are appropriate for you.  If you need a cane or walker, use it as recommended by your health care provider.  Wear supportive shoes that have nonskid soles. Lifestyle  Do not drink alcohol if your health care provider tells you not to drink.  If you drink alcohol, limit how much you have: ? 0-1 drink a day for women. ? 0-2 drinks a day for men.  Be aware of how much alcohol is in your drink. In the [place] one drink equals one typical bottle of beer (12 oz), one-half glass of wine (5 oz), or one shot of hard liquor (1 oz).  Do not use any products that contain nicotine or tobacco, such as cigarettes and e-cigarettes. If you need help quitting, ask your health care provider. Summary  Having a healthy lifestyle and getting preventive care can help to protect your health and wellness after age 85.  Screening and testing are the best way to find a health problem early and help you avoid having a fall. Early diagnosis and treatment give you the best chance for managing medical conditions that are more common for people who are older than age 36.  Falls are a major cause of broken bones and head injuries in people who are older than age 36. Take precautions to prevent a fall at home.  Work with your health care provider to learn what changes you can make to improve your health and wellness and to prevent falls. This information is not intended to replace advice given to you by your health care provider. Make sure you discuss any questions you have with your health care provider. Document Released: 01/11/2017 Document Revised: 06/21/2018 Document Reviewed: 01/11/2017 Elsevier Patient Education  2020 [employer].

## 2018-11-27 NOTE — Progress Notes (Addendum)
[place] [facility] [address], [place], [place] [address] [phone]  Date:  11/28/2018   Name:  Patrick [name]   DOB:  August 23, 1950   MRN:  [id number]  PCP:  Darreld Mclean, MD    Chief Complaint: Annual Exam (flu shot)   History of Present Illness:  Patrick [name] is a 68 y.o. very pleasant male patient who presents with the following:  Here today for complete physical Patrick [name] has history of hearing loss, anxiety, sleep apnea, BPH, degenerative spine disease, and frequent headaches He also contacted me yesterday about a left elbow issue.  We obtained elbow films as follows: Dg Elbow Complete Left  Result Date: 11/27/2018 CLINICAL DATA:  Painful bump on left elbow EXAM: LEFT ELBOW - COMPLETE 3+ VIEW COMPARISON:  None. FINDINGS: No fracture or dislocation of the left elbow. The joint spaces are well preserved. No elbow joint effusion. There is soft tissue thickening over the posterior olecranon. IMPRESSION: No fracture or dislocation of the left elbow. The joint spaces are well preserved. No elbow joint effusion. There is soft tissue thickening over the posterior olecranon. Electronically Signed   By: Eddie Candle M.D.   On: 11/27/2018 15:40   He has noted a bump on his lateral left elbow for about 2 weeks NKI It hurts to touch the area - dull pain It is less acutely tender than it was, but still bothersome  He also notes that he may have some sensitivity to chemical smells or dust over the last several months If he is exposed to some strong smells like bleach, or if breaths in dust, he will feel like his throat is closing Does not trigger sneeze or cough in particular- more a feeling of SOB Albuterol does not necessarily help He started to notice this more earlier this year when people began using so much bleach, etc for pandemic  He has used a home pulse ox and is generally ok   Albuterol as needed  Fioricet  as needed Clonazepam at bedtime Pepcid as needed Hydrocodone as needed Zoloft 200 Meloxicam Methylphenidate as needed Lyrica 200 at bedtime Propranolol as needed tremor Trazodone as needed  11/18/2018  1   09/04/2018  Butalb-Acetaminoph-Caff-Codein  42.00  7 Je Cop   [id number]   Exp (4317)   1  27.00 MME  Comm Ins   [place]  11/18/2018  1   06/04/2018  Pregabalin 100 MG Capsule  180.00  90 Je Cop   [id number]   Exp (4317)   2  1.34 LME  Comm Ins   [place]  10/31/2018  1   10/22/2018  Methylphenidate 20 MG Tablet  180.00  90 Je Cop   [id number]   Exp (4317)   0   Comm Ins   [place]  10/10/2018  1   08/03/2018  Clonazepam 0.5 MG Tablet  180.00  90 Je Cop   [id number]   Exp (4317)   1  2.00 LME  Comm Ins   [place]  09/20/2018  2   09/18/2018  Hydrocodone-Acetamin 5-325 MG  21.00  7 Je Cop   [id number]   Wal (8139)   0  15.00 MME  Comm Ins   [place]  09/09/2018  1   09/04/2018  Butalb-Acetaminoph-Caff-Codein  42.00  7 Je Cop   [id number]   Exp (4317)   0  27.00 MME  Comm Ins   [place]  08/21/2018  1  06/04/2018  Pregabalin 100 MG Capsule  180.00  90 Je Cop   [id number]   Exp (4317)   1  1.34 LME  Comm Ins   [place]  08/06/2018  1   08/03/2018  Clonazepam 0.5 MG Tablet  180.00  90 Je Cop   [id number]   Exp (4317)   0  2.00 LME  Comm Ins   [place]  07/02/2018  1   06/25/2018  Methylphenidate 20 MG Tablet  180.00  90 Je Cop   [id number]   Exp (4317)   0   Comm Ins   [place]  06/07/2018  1   06/04/2018  Pregabalin 100 MG Capsule  180.00  90 Je Cop   [id number]   Exp (4317)   0  1.34 LME  Comm Ins   [place]  05/03/2018  2   05/03/2018  Hydrocodone-Acetamin 5-325 MG  21.00  7 Je Cop   [id number]   Wal (8139)   0  15.00 MME  Comm Ins   [place]  04/08/2018  1   12/18/2017  Clonazepam 0.5 MG Tablet  180.00  90 Je Cop   [id number]   Exp (4317)   1  2.00 LME  Comm Ins   [place]  03/01/2018  1   12/21/2017  Pregabalin 100 MG Capsule  180.00  90 Je Cop   [id number]   Exp (4317)   1  1.34 LME  Comm Ins     02/21/2018  2   02/21/2018   Oxycodone-Acetaminophen 5-325  51.00  7 Je Fel   [id number]   Wal (8139)   0  54.64 MME      Due for annual UDS today Flu shot- give today  Not yet due for Pneumovax Shingrix done Most recent blood work about 1 year ago Lung cancer screening CT done in August  Patient Active Problem List   Diagnosis Date Noted  . Hearing loss 12/15/2016  . Chronic fatigue 10/09/2015  . Generalized anxiety disorder 10/09/2015  . Memory loss 10/09/2015  . Obstructive sleep apnea of adult 02/09/2015  . Benign prostatic hyperplasia with urinary obstruction 11/18/2013  . Acid reflux 11/18/2013  . Degenerative arthritis of lumbar spine 11/18/2013  . Headache, migraine 11/18/2013  . External hemorrhoids with other complication [id number]  . Restless leg 05/27/2009    Past Medical History:  Diagnosis Date  . Anxiety   . Arthritis   . Basal cell carcinoma   . BPH (benign prostatic hyperplasia)   . Depression   . Dysphagia   . Fibromyalgia   . GERD (gastroesophageal reflux disease)    otc  tums  . Headache    migraines  . Herpes simplex type 1 infection   . History of migraine   . Hypertension    no longer on medication  . Insomnia   . Lower back pain   . Morton's neuroma   . OSA (obstructive sleep apnea)   . Primary snoring   . Recurrent canker sores   . Urinary frequency     Past Surgical History:  Procedure Laterality Date  . EPIDIDYMECTOMY     for spermatocele  . EYE SURGERY Bilateral    lasik eye surgery  . FOOT SURGERY Left   . INGUINAL HERNIA REPAIR Left    01/25/1999  . MOUTH SURGERY    . pancreatic surgery r/t trauma    . SEPTOPLASTY N/A 02/09/2015   Procedure: SEPTOPLASTY;  Surgeon: Rozetta Nunnery, MD;  Location: [facility];  Service:  ENT;  Laterality: N/A;  . SKIN CANCER EXCISION    . TONSILLECTOMY    . TRANSURETHRAL INCISION OF PROSTATE    . TURBINATE REDUCTION Bilateral 02/09/2015   Procedure: BILATERAL TURBINATE REDUCTION;  Surgeon: Rozetta Nunnery, MD;   Location: [facility];  Service: ENT;  Laterality: Bilateral;  . UVULOPALATOPHARYNGOPLASTY N/A 02/09/2015   Procedure: UVULOPALATOPHARYNGOPLASTY (UPPP);  Surgeon: Rozetta Nunnery, MD;  Location: [facility];  Service: ENT;  Laterality: N/A;    Social History   Tobacco Use  . Smoking status: Former Smoker    Packs/day: 2.00    Years: 30.00    Pack years: 60.00    Types: Cigarettes    Quit date: 10/29/2005    Years since quitting: 13.0  . Smokeless tobacco: Never Used  Substance Use Topics  . Alcohol use: Yes    Alcohol/week: 0.0 standard drinks    Comment: Rare  . Drug use: No    Family History  Problem Relation Age of Onset  . Coronary artery disease Mother   . Stroke Mother   . Hypertension Father   . Anxiety disorder Brother   . Skin cancer Brother     No Known Allergies  Medication list has been reviewed and updated.  Current Outpatient Medications on File Prior to Visit  Medication Sig Dispense Refill  . albuterol (VENTOLIN HFA) 108 (90 Base) MCG/ACT inhaler Inhale 2 puffs into the lungs every 6 (six) hours as needed for wheezing or shortness of breath. 18 g 2  . betamethasone dipropionate (DIPROLENE) 0.05 % cream Apply topically 2 (two) times daily. Use as needed 30 g 3  . Butalbital-APAP-Caff-Cod 50-300-40-30 MG CAPS Take 1-2 caps every 6 hours as needed for headache.  Max 6/24 hours 90 capsule 0  . Cholecalciferol (VITAMIN D3) 2000 UNITS TABS Take 2,000 Units by mouth daily.    . clonazePAM (KLONOPIN) 0.5 MG tablet Take 1 or 2 at bedtime as needed for sleep 180 tablet 1  . famotidine (PEPCID) 20 MG tablet Take once or twice a day as needed to control GERD 180 tablet 3  . fluticasone (FLONASE) 50 MCG/ACT nasal spray Place 2 sprays into both nostrils daily. 43 g 3  . HYDROcodone-acetaminophen (NORCO/VICODIN) 5-325 MG tablet Take 1 tablet by mouth every 8 (eight) hours as needed for moderate pain. 45 tablet 0  . Melatonin 5 MG TABS Take 5 mg by mouth at bedtime.    .  meloxicam (MOBIC) 15 MG tablet TAKE 1 TABLET DAILY 90 tablet 1  . methylphenidate (RITALIN) 20 MG tablet Take 1 tablet (20 mg total) by mouth 2 (two) times daily. 180 tablet 0  . omeprazole (PRILOSEC) 40 MG capsule Take 1 capsule (40 mg total) by mouth daily. 90 capsule 3  . pregabalin (LYRICA) 100 MG capsule Take 2 capsules (200 mg total) by mouth at bedtime. 180 capsule 3  . propranolol (INDERAL) 10 MG tablet TAKE 1 TABLET EVERY 12 HOURS AS NEEDED FOR TREMOR 180 tablet 4  . sertraline (ZOLOFT) 100 MG tablet Take 2 tablets (200 mg total) by mouth daily. 180 tablet 3  . sucralfate (CARAFATE) 1 g tablet Take 1 tablet (1 g total) by mouth 4 (four) times daily -  with meals and at bedtime. 120 tablet 5  . traZODone (DESYREL) 100 MG tablet Take 1/2 or 1 tablet (50 or 100 mg) at bedtime as needed for sleep 90 tablet 3  . vitamin B-12 (CYANOCOBALAMIN) 500 MCG tablet Take 500 mcg by mouth daily.  No current facility-administered medications on file prior to visit.     Review of Systems:  As per HPI- otherwise negative. No fever or chills No chest pain  Physical Examination: Vitals:   11/28/18 1315  BP: 122/90  Pulse: 74  Resp: 16  Temp: (!) 96.8 F (36 C)  SpO2: 98%   Vitals:   11/28/18 1315  Weight: 187 lb (84.8 kg)  Height: 6' (1.829 m)   Body mass index is 25.36 kg/m. Ideal Body Weight: Weight in (lb) to have BMI = 25: 183.9  GEN: WDWN, NAD, Non-toxic, A & O x 3 HEENT: Atraumatic, Normocephalic. Neck supple. No masses, No LAD. Ears and Nose: No external deformity. CV: RRR, No M/G/R. No JVD. No thrill. No extra heart sounds. PULM: CTA B, no wheezes, crackles, rhonchi. No retractions. No resp. distress. No accessory muscle use. ABD: S, NT, ND, +BS. No rebound. No HSM. EXTR: No c/c/e NEURO Normal gait.  PSYCH: Normally interactive. Conversant. Not depressed or anxious appearing.  Calm demeanor.  Left elbow: He has a firm nodule over the lateral epicondyle.  This is slightly  tender.  Nonfluctuant, I do not think this is an infection.  Otherwise the elbow exam is normal, normal range of motion with no pain.  No joint redness, effusion, stiffness  Assessment and Plan: Physical exam  Left elbow pain  Chronic fatigue  Osteoarthritis of lumbar spine, unspecified spinal osteoarthritis complication status - Plan: HYDROcodone-acetaminophen (NORCO/VICODIN) 5-325 MG tablet, DISCONTINUED: HYDROcodone-acetaminophen (NORCO/VICODIN) 5-325 MG tablet  Gastroesophageal reflux disease, esophagitis presence not specified  Screening for deficiency anemia - Plan: CBC  Screening for hyperlipidemia - Plan: Lipid panel  Screening for diabetes mellitus - Plan: Comprehensive metabolic panel  Screening for prostate cancer - Plan: PSA  GAD (generalized anxiety disorder)  Chronic pain syndrome  Medication management - Plan: Pain Mgmt, Profile 8 w/Conf, U  Sciatica, unspecified laterality - Plan: HYDROcodone-acetaminophen (NORCO/VICODIN) 5-325 MG tablet, DISCONTINUED: HYDROcodone-acetaminophen (NORCO/VICODIN) 5-325 MG tablet  Irritable airways - Plan: montelukast (SINGULAIR) 10 MG tablet  Here today for physical exam Labs pending as above He has an orthopedist to did a rotator cuff repair for him last year.  I called this office and schedule an appoint for later this week for his elbow-this appointment actually does not work for him as they are out of town.  He will call them and reschedule Refilled pain medication, UDS today It sounds as though 10 has some irritable airway, perhaps triggered by increased exposure to bleach and other chemicals.  He is already taking Zyrtec daily.  Albuterol does not seem to help.  We will try Singulair, asked him to let me know if helpful  Will plan further follow- up pending labs.  He is retiring soon and will begin to use [facility] for mail away rx -he will let me know when he wants his prescriptions changed over [place],  MD  Received his labs, message to pt  Results for orders placed or performed in visit on 11/28/18  CBC  Result Value Ref Range   WBC 4.6 4.0 - 10.5 K/uL   RBC 4.94 4.22 - 5.81 Mil/uL   Platelets 148.0 (L) 150.0 - 400.0 K/uL   Hemoglobin 15.8 13.0 - 17.0 g/dL   HCT 46.7 39.0 - 52.0 %   MCV 94.4 78.0 - 100.0 fl   MCHC 33.8 30.0 - 36.0 g/dL   RDW 12.9 11.5 - 15.5 %  Comprehensive metabolic panel  Result Value Ref Range  Sodium 141 135 - 145 mEq/L   Potassium 4.5 3.5 - 5.1 mEq/L   Chloride 103 96 - 112 mEq/L   CO2 31 19 - 32 mEq/L   Glucose, Bld 80 70 - 99 mg/dL   BUN 16 6 - 23 mg/dL   Creatinine, Ser 1.03 0.40 - 1.50 mg/dL   Total Bilirubin 0.9 0.2 - 1.2 mg/dL   Alkaline Phosphatase 66 39 - 117 U/L   AST 16 0 - 37 U/L   ALT 16 0 - 53 U/L   Total Protein 7.0 6.0 - 8.3 g/dL   Albumin 4.5 3.5 - 5.2 g/dL   Calcium 9.8 8.4 - 10.5 mg/dL   GFR 72.23 >60.00 mL/min  Lipid panel  Result Value Ref Range   Cholesterol 171 0 - 200 mg/dL   Triglycerides 163.0 (H) 0.0 - 149.0 mg/dL   HDL 45.30 >39.00 mg/dL   VLDL 32.6 0.0 - 40.0 mg/dL   LDL Cholesterol 93 0 - 99 mg/dL   Total CHOL/HDL Ratio 4    NonHDL 125.58   PSA  Result Value Ref Range   PSA 0.47 0.10 - 4.00 ng/mL

## 2018-11-28 ENCOUNTER — Encounter: Payer: Self-pay | Admitting: Family Medicine

## 2018-11-28 ENCOUNTER — Ambulatory Visit (INDEPENDENT_AMBULATORY_CARE_PROVIDER_SITE_OTHER): Payer: BC Managed Care – PPO | Admitting: Family Medicine

## 2018-11-28 VITALS — BP 122/90 | HR 74 | Temp 96.8°F | Resp 16 | Ht 72.0 in | Wt 187.0 lb

## 2018-11-28 DIAGNOSIS — Z79899 Other long term (current) drug therapy: Secondary | ICD-10-CM

## 2018-11-28 DIAGNOSIS — F411 Generalized anxiety disorder: Secondary | ICD-10-CM

## 2018-11-28 DIAGNOSIS — Z130 Encounter for screening for diseases of the blood and blood-forming organs and certain disorders involving the immune mechanism: Secondary | ICD-10-CM | POA: Diagnosis not present

## 2018-11-28 DIAGNOSIS — M25522 Pain in left elbow: Secondary | ICD-10-CM

## 2018-11-28 DIAGNOSIS — M5430 Sciatica, unspecified side: Secondary | ICD-10-CM

## 2018-11-28 DIAGNOSIS — Z131 Encounter for screening for diabetes mellitus: Secondary | ICD-10-CM

## 2018-11-28 DIAGNOSIS — Z13220 Encounter for screening for lipoid disorders: Secondary | ICD-10-CM

## 2018-11-28 DIAGNOSIS — J45998 Other asthma: Secondary | ICD-10-CM

## 2018-11-28 DIAGNOSIS — Z0000 Encounter for general adult medical examination without abnormal findings: Secondary | ICD-10-CM

## 2018-11-28 DIAGNOSIS — Z125 Encounter for screening for malignant neoplasm of prostate: Secondary | ICD-10-CM | POA: Diagnosis not present

## 2018-11-28 DIAGNOSIS — R5382 Chronic fatigue, unspecified: Secondary | ICD-10-CM | POA: Diagnosis not present

## 2018-11-28 DIAGNOSIS — M47816 Spondylosis without myelopathy or radiculopathy, lumbar region: Secondary | ICD-10-CM

## 2018-11-28 DIAGNOSIS — K219 Gastro-esophageal reflux disease without esophagitis: Secondary | ICD-10-CM

## 2018-11-28 DIAGNOSIS — G894 Chronic pain syndrome: Secondary | ICD-10-CM

## 2018-11-28 LAB — COMPREHENSIVE METABOLIC PANEL
ALT: 16 U/L (ref 0–53)
AST: 16 U/L (ref 0–37)
Albumin: 4.5 g/dL (ref 3.5–5.2)
Alkaline Phosphatase: 66 U/L (ref 39–117)
BUN: 16 mg/dL (ref 6–23)
CO2: 31 mEq/L (ref 19–32)
Calcium: 9.8 mg/dL (ref 8.4–10.5)
Chloride: 103 mEq/L (ref 96–112)
Creatinine, Ser: 1.03 mg/dL (ref 0.40–1.50)
GFR: 72.23 mL/min (ref >60.00)
Glucose, Bld: 80 mg/dL (ref 70–99)
Potassium: 4.5 mEq/L (ref 3.5–5.1)
Sodium: 141 mEq/L (ref 135–145)
Total Bilirubin: 0.9 mg/dL (ref 0.2–1.2)
Total Protein: 7 g/dL (ref 6.0–8.3)

## 2018-11-28 LAB — LIPID PANEL
Cholesterol: 171 mg/dL (ref 0–200)
HDL: 45.3 mg/dL (ref >39.00)
LDL Cholesterol: 93 mg/dL (ref 0–99)
NonHDL: 125.58
Total CHOL/HDL Ratio: 4
Triglycerides: 163 mg/dL — ABNORMAL HIGH (ref 0.0–149.0)
VLDL: 32.6 mg/dL (ref 0.0–40.0)

## 2018-11-28 LAB — CBC
HCT: 46.7 % (ref 39.0–52.0)
Hemoglobin: 15.8 g/dL (ref 13.0–17.0)
MCHC: 33.8 g/dL (ref 30.0–36.0)
MCV: 94.4 fl (ref 78.0–100.0)
Platelets: 148 10*3/uL — ABNORMAL LOW (ref 150.0–400.0)
RBC: 4.94 Mil/uL (ref 4.22–5.81)
RDW: 12.9 % (ref 11.5–15.5)
WBC: 4.6 10*3/uL (ref 4.0–10.5)

## 2018-11-28 LAB — PSA: PSA: 0.47 ng/mL (ref 0.10–4.00)

## 2018-11-28 MED ORDER — MONTELUKAST SODIUM 10 MG PO TABS: 10.0000 mg | ORAL_TABLET | Freq: Every day | ORAL | 3 refills | Status: DC

## 2018-11-28 MED ORDER — HYDROCODONE-ACETAMINOPHEN 5-325 MG PO TABS: 1.0000 | ORAL_TABLET | Freq: Three times a day (TID) | ORAL | 0 refills | Status: DC | PRN

## 2018-11-29 LAB — PAIN MGMT, PROFILE 8 W/CONF, U
6 Acetylmorphine: NEGATIVE ng/mL
Alcohol Metabolites: NEGATIVE ng/mL (ref <500)
Amphetamines: NEGATIVE ng/mL
Benzodiazepines: NEGATIVE ng/mL
Buprenorphine, Urine: NEGATIVE ng/mL
Cocaine Metabolite: NEGATIVE ng/mL
Creatinine: 29 mg/dL
MDMA: NEGATIVE ng/mL
Marijuana Metabolite: NEGATIVE ng/mL
Opiates: NEGATIVE ng/mL
Oxidant: NEGATIVE ug/mL
Oxycodone: NEGATIVE ng/mL
pH: 6.7 (ref 4.5–9.0)

## 2018-12-04 ENCOUNTER — Encounter: Payer: Self-pay | Admitting: Family Medicine

## 2018-12-04 DIAGNOSIS — M67322 Transient synovitis, left elbow: Secondary | ICD-10-CM | POA: Diagnosis not present

## 2018-12-04 MED ORDER — ALBUTEROL SULFATE HFA 108 (90 BASE) MCG/ACT IN AERS: 2.0000 | INHALATION_SPRAY | Freq: Four times a day (QID) | RESPIRATORY_TRACT | 2 refills | Status: DC | PRN

## 2018-12-05 ENCOUNTER — Encounter: Payer: Self-pay | Admitting: Family Medicine

## 2018-12-05 DIAGNOSIS — M67322 Transient synovitis, left elbow: Secondary | ICD-10-CM | POA: Diagnosis not present

## 2018-12-06 ENCOUNTER — Encounter: Payer: Self-pay | Admitting: Family Medicine

## 2018-12-06 DIAGNOSIS — K219 Gastro-esophageal reflux disease without esophagitis: Secondary | ICD-10-CM

## 2018-12-06 DIAGNOSIS — R1013 Epigastric pain: Secondary | ICD-10-CM

## 2018-12-06 DIAGNOSIS — J45998 Other asthma: Secondary | ICD-10-CM

## 2018-12-06 DIAGNOSIS — F411 Generalized anxiety disorder: Secondary | ICD-10-CM

## 2018-12-06 DIAGNOSIS — M5430 Sciatica, unspecified side: Secondary | ICD-10-CM

## 2018-12-06 DIAGNOSIS — M47816 Spondylosis without myelopathy or radiculopathy, lumbar region: Secondary | ICD-10-CM

## 2018-12-06 DIAGNOSIS — R251 Tremor, unspecified: Secondary | ICD-10-CM

## 2018-12-06 DIAGNOSIS — G894 Chronic pain syndrome: Secondary | ICD-10-CM

## 2018-12-06 DIAGNOSIS — F5101 Primary insomnia: Secondary | ICD-10-CM

## 2018-12-06 MED ORDER — BUTALBITAL-APAP-CAFF-COD 50-300-40-30 MG PO CAPS: ORAL_CAPSULE | ORAL | 0 refills | Status: DC

## 2018-12-06 MED ORDER — PREGABALIN 100 MG PO CAPS: 200.0000 mg | ORAL_CAPSULE | Freq: Every day | ORAL | 3 refills | Status: DC

## 2018-12-06 MED ORDER — MONTELUKAST SODIUM 10 MG PO TABS: 10.0000 mg | ORAL_TABLET | Freq: Every day | ORAL | 3 refills | Status: DC

## 2018-12-06 MED ORDER — PROPRANOLOL HCL 10 MG PO TABS: ORAL_TABLET | ORAL | 3 refills | Status: DC

## 2018-12-06 MED ORDER — HYDROCODONE-ACETAMINOPHEN 5-325 MG PO TABS: 1.0000 | ORAL_TABLET | Freq: Three times a day (TID) | ORAL | 0 refills | Status: DC | PRN

## 2018-12-06 MED ORDER — MELOXICAM 15 MG PO TABS: 15.0000 mg | ORAL_TABLET | Freq: Every day | ORAL | 3 refills | Status: DC

## 2018-12-06 MED ORDER — CLONAZEPAM 0.5 MG PO TABS: ORAL_TABLET | ORAL | 1 refills | Status: DC

## 2018-12-06 MED ORDER — TRAZODONE HCL 100 MG PO TABS: ORAL_TABLET | ORAL | 3 refills | Status: DC

## 2018-12-06 MED ORDER — FAMOTIDINE 20 MG PO TABS: ORAL_TABLET | ORAL | 3 refills | Status: DC

## 2018-12-06 MED ORDER — OMEPRAZOLE 40 MG PO CPDR: 40.0000 mg | DELAYED_RELEASE_CAPSULE | Freq: Every day | ORAL | 3 refills | Status: DC

## 2018-12-18 ENCOUNTER — Other Ambulatory Visit: Payer: Self-pay | Admitting: Family Medicine

## 2018-12-18 DIAGNOSIS — M47816 Spondylosis without myelopathy or radiculopathy, lumbar region: Secondary | ICD-10-CM

## 2018-12-18 DIAGNOSIS — M5430 Sciatica, unspecified side: Secondary | ICD-10-CM

## 2018-12-25 ENCOUNTER — Encounter: Payer: Self-pay | Admitting: Family Medicine

## 2018-12-25 DIAGNOSIS — R5382 Chronic fatigue, unspecified: Secondary | ICD-10-CM

## 2018-12-25 MED ORDER — BUTALBITAL-APAP-CAFF-COD 50-300-40-30 MG PO CAPS: ORAL_CAPSULE | ORAL | 1 refills | Status: DC

## 2018-12-25 MED ORDER — METHYLPHENIDATE HCL 20 MG PO TABS: 20.0000 mg | ORAL_TABLET | Freq: Two times a day (BID) | ORAL | 0 refills | Status: DC

## 2018-12-25 NOTE — Addendum Note (Signed)
Addended by: Lamar Blinks C on: 12/25/2018 03:44 PM   Modules accepted: Orders

## 2018-12-31 ENCOUNTER — Institutional Professional Consult (permissible substitution): Payer: BC Managed Care – PPO | Admitting: Pulmonary Disease

## 2019-01-07 ENCOUNTER — Institutional Professional Consult (permissible substitution): Payer: Self-pay | Admitting: Pulmonary Disease

## 2019-01-30 ENCOUNTER — Other Ambulatory Visit: Payer: Self-pay

## 2019-01-30 ENCOUNTER — Ambulatory Visit (INDEPENDENT_AMBULATORY_CARE_PROVIDER_SITE_OTHER): Payer: Medicare Other | Admitting: Pulmonary Disease

## 2019-01-30 ENCOUNTER — Encounter: Payer: Self-pay | Admitting: Pulmonary Disease

## 2019-01-30 VITALS — BP 126/78 | HR 73 | Temp 98.2°F | Ht 72.5 in | Wt 187.8 lb

## 2019-01-30 DIAGNOSIS — R05 Cough: Secondary | ICD-10-CM

## 2019-01-30 DIAGNOSIS — R053 Chronic cough: Secondary | ICD-10-CM

## 2019-01-30 LAB — CBC WITH DIFFERENTIAL/PLATELET
Basophils Absolute: 0 10*3/uL (ref 0.0–0.1)
Basophils Relative: 0.7 % (ref 0.0–3.0)
Eosinophils Absolute: 0.1 10*3/uL (ref 0.0–0.7)
Eosinophils Relative: 1.9 % (ref 0.0–5.0)
HCT: 46.6 % (ref 39.0–52.0)
Hemoglobin: 15.8 g/dL (ref 13.0–17.0)
Lymphocytes Relative: 30.4 % (ref 12.0–46.0)
Lymphs Abs: 1.3 10*3/uL (ref 0.7–4.0)
MCHC: 33.8 g/dL (ref 30.0–36.0)
MCV: 94.2 fl (ref 78.0–100.0)
Monocytes Absolute: 0.4 10*3/uL (ref 0.1–1.0)
Monocytes Relative: 10.7 % (ref 3.0–12.0)
Neutro Abs: 2.3 10*3/uL (ref 1.4–7.7)
Neutrophils Relative %: 56.3 % (ref 43.0–77.0)
Platelets: 159 10*3/uL (ref 150.0–400.0)
RBC: 4.95 Mil/uL (ref 4.22–5.81)
RDW: 13.2 % (ref 11.5–15.5)
WBC: 4.1 10*3/uL (ref 4.0–10.5)

## 2019-01-30 MED ORDER — BREO ELLIPTA 200-25 MCG/INH IN AEPB: 1.0000 | INHALATION_SPRAY | Freq: Every day | RESPIRATORY_TRACT | 0 refills | Status: DC

## 2019-01-30 NOTE — Progress Notes (Signed)
Subjective:   PATIENT ID: Patrick [name] GENDER: male DOB: 1950/11/06, MRN: [id number]   HPI  Chief Complaint  Patient presents with  . Consult    cough jan2020, sensitive to strong scents feels like throat is closing up, started taking singulair 2-3 months ago, recent xray was told he had mild emphysema    Reason for Visit: New consult for chronic cough  [name] is a 68 year old former smoker with GAD, moderate OSA and acid reflux who presents as a new consult for chronic cough.  He reports for the last several in the last several months ince dry hacking cough that occurs several times a day. Worse in the mornings. Denies wheezing. Triggered by cleaning solutions and wipes and strong scents. Can also be triggered when exposed to dust, grass, leaves. Also reports episodes of his throat closing up associated with sensations of feeling like he cannot breath. This last occurred one month ago. He has been taking singulair with some relief. He tried a rescue inhaler for a few weeks but did not notice a difference. On ROS, he reports a hx of reflux described as retrosternal burning.  He reports he is otherwise fairly active. He walks two miles daily. Only has shortness of breath with heavy exertion such as running.   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 Quit smoking in 2007 Father had ?ILD after working at [employer]. Exposure to chemicals, powders   Environmental exposures:  Prior home reno projects including insulation and flooring. No clear exposure to asbestos Intermittent projects with furniture including [place] with metals  I have personally reviewed patient's past medical/family/social history, allergies, current medications.  Past Medical History:  Diagnosis Date  . Anxiety   . Arthritis   . Basal cell carcinoma   . BPH (benign prostatic hyperplasia)   . Depression   . Dysphagia   . Emphysema of lung  ([place])   . Fibromyalgia   . GERD (gastroesophageal reflux disease)    otc  tums  . Headache    migraines  . Herpes simplex type 1 infection   . History of migraine   . Hypertension    no longer on medication  . Insomnia   . Lower back pain   . Morton's neuroma   . OSA (obstructive sleep apnea)   . Primary snoring   . Recurrent canker sores   . Urinary frequency      Family History  Problem Relation Age of Onset  . Coronary artery disease Mother   . Stroke Mother   . Hypertension Father   . Anxiety disorder Brother   . Skin cancer Brother   . Stroke Brother      Social History   Occupational History  . Occupation: Chief Financial Officer  Tobacco Use  . Smoking status: Former Smoker    Packs/day: 2.00    Years: 30.00    Pack years: 60.00    Types: Cigarettes    Quit date: 10/29/2005    Years since quitting: 13.2  . Smokeless tobacco: Never Used  Substance and Sexual Activity  . Alcohol use: Yes    Alcohol/week: 1.0 standard drinks    Types: 1 Cans of beer per week    Comment: 1 beer every other day  . Drug use: No  . Sexual activity: Not on file    No Known Allergies   Outpatient Medications Prior to Visit  Medication Sig Dispense Refill  . albuterol (VENTOLIN  HFA) 108 (90 Base) MCG/ACT inhaler Inhale 2 puffs into the lungs every 6 (six) hours as needed for wheezing or shortness of breath. 18 g 2  . betamethasone dipropionate (DIPROLENE) 0.05 % cream Apply topically 2 (two) times daily. Use as needed 30 g 3  . Butalbital-APAP-Caff-Cod 50-300-40-30 MG CAPS Take 1-2 caps every 6 hours as needed for headache.  Max 6/24 hours 40 capsule 1  . Cholecalciferol (VITAMIN D3) 2000 UNITS TABS Take 2,000 Units by mouth daily.    . clonazePAM (KLONOPIN) 0.5 MG tablet Take 1 or 2 at bedtime as needed for sleep (Patient taking differently: Take 1 mg by mouth at bedtime. ) 180 tablet 1  . famotidine (PEPCID) 20 MG tablet Take once or twice a day as needed to control GERD 180 tablet 3  .  fluticasone (FLONASE) 50 MCG/ACT nasal spray Place 2 sprays into both nostrils daily. 43 g 3  . HYDROcodone-acetaminophen (NORCO/VICODIN) 5-325 MG tablet Take 1 tablet by mouth every 8 (eight) hours as needed for moderate pain. 45 tablet 0  . Melatonin 5 MG TABS Take 5 mg by mouth at bedtime.    . meloxicam (MOBIC) 15 MG tablet Take 1 tablet (15 mg total) by mouth daily. As needed for pain 90 tablet 3  . methylphenidate (RITALIN) 20 MG tablet Take 1 tablet (20 mg total) by mouth 2 (two) times daily. 180 tablet 0  . montelukast (SINGULAIR) 10 MG tablet Take 1 tablet (10 mg total) by mouth at bedtime. 90 tablet 3  . omeprazole (PRILOSEC) 40 MG capsule Take 1 capsule (40 mg total) by mouth daily. 90 capsule 3  . pregabalin (LYRICA) 100 MG capsule Take 2 capsules (200 mg total) by mouth at bedtime. 180 capsule 3  . propranolol (INDERAL) 10 MG tablet TAKE 1 TABLET EVERY 12 HOURS AS NEEDED FOR TREMOR 180 tablet 3  . sucralfate (CARAFATE) 1 g tablet Take 1 tablet (1 g total) by mouth 4 (four) times daily -  with meals and at bedtime. 120 tablet 5  . traZODone (DESYREL) 100 MG tablet Take 1/2 or 1 tablet (50 or 100 mg) at bedtime as needed for sleep 90 tablet 3  . vitamin B-12 (CYANOCOBALAMIN) 500 MCG tablet Take 500 mcg by mouth daily.    . sertraline (ZOLOFT) 100 MG tablet Take 2 tablets (200 mg total) by mouth daily. (Patient not taking: Reported on 01/30/2019) 180 tablet 3   No facility-administered medications prior to visit.     Review of Systems  Constitutional: Negative for chills, diaphoresis, fever, malaise/fatigue and weight loss.  HENT: Negative for congestion, ear pain and sore throat.   Respiratory: Positive for cough and shortness of breath. Negative for hemoptysis, sputum production and wheezing.   Cardiovascular: Negative for chest pain, palpitations and leg swelling.  Gastrointestinal: Positive for heartburn. Negative for abdominal pain and nausea.  Genitourinary: Negative for  frequency.  Musculoskeletal: Negative for joint pain and myalgias.  Skin: Negative for itching and rash.  Neurological: Negative for dizziness, weakness and headaches.  Endo/Heme/Allergies: Does not bruise/bleed easily.  Psychiatric/Behavioral: Negative for depression. The patient is not nervous/anxious.      Objective:   Vitals:   01/30/19 1134  BP: 126/78  Pulse: 73  Temp: 98.2 F (36.8 C)  TempSrc: Temporal  SpO2: 99%  Weight: 187 lb 12.8 oz (85.2 kg)  Height: 6' 0.5" (1.842 m)   SpO2: 99 % O2 Device: None (Room air)  Physical Exam: General: Well-appearing, no acute distress HENT: [place],  AT, OP clear, MMM Eyes: EOMI, no scleral icterus Respiratory: Clear to auscultation bilaterally.  No crackles, wheezing or rales Cardiovascular: RRR, -M/R/G, no JVD GI: BS+, soft, nontender Extremities:-Edema,-tenderness Neuro: AAO x4, CNII-XII grossly intact Skin: Intact, no rashes or bruising Psych: Normal mood, normal affect  Data Reviewed:  Imaging: CT Chest Lung Screen 10/15/18 - Centrilobular emphysema, RLL calcified granuloma. Subpleural left base nodule, unchanged  PFT: None on file  Labs: CBC    Component Value Date/Time   WBC 4.1 01/30/2019 1227   RBC 4.95 01/30/2019 1227   HGB 15.8 01/30/2019 1227   HCT 46.6 01/30/2019 1227   PLT 159.0 01/30/2019 1227   MCV 94.2 01/30/2019 1227   MCH 32.3 09/26/2015 1505   MCHC 33.8 01/30/2019 1227   RDW 13.2 01/30/2019 1227   LYMPHSABS 1.3 01/30/2019 1227   MONOABS 0.4 01/30/2019 1227   EOSABS 0.1 01/30/2019 1227   BASOSABS 0.0 01/30/2019 1227   BMET    Component Value Date/Time   NA 141 11/28/2018 1350   NA 143 07/25/2015   K 4.5 11/28/2018 1350   CL 103 11/28/2018 1350   CO2 31 11/28/2018 1350   GLUCOSE 80 11/28/2018 1350   BUN 16 11/28/2018 1350   BUN 13 07/25/2015   CREATININE 1.03 11/28/2018 1350   CALCIUM 9.8 11/28/2018 1350   GFRNONAA >60 09/26/2015 1505   GFRAA >60 09/26/2015 1505   Sleep study: 08/17/14  Split night - AHI 15.8 08/28/14 CPAP titration - Recommend 10cm H20  Imaging, labs and tests noted above have been reviewed independently by me.    Assessment & Plan:   Discussion:  68 year old former smoker with GAD, moderate OSA and acid reflux who presents as a new consult for chronic cough x4-5 months. Multiple causes for cough are possible including uncontrolled asthma, acid reflux and post-nasal drainage. We recommend the following to address these issues.  Chronic cough, episodic  --Labs ordered: CBC with diff, IgE --CONTINUE singulair 10 mg daily --START trial of Breo --Pulmonary function tests will be arranged   Allergic Rhinitis --START Flonase (fluticasone) 1 spray per nare. THIS IS OVER THE COUNTER --DONT USE Afrin (oxymetazoline). THIS WILL CAUSE REBOUND CONGESTION  Reflux  --Recommend omeprazole 20 mg daily x 6 weeks. THIS IS OVER THE COUNTER --Avoid late meals, caffeine, alcohol, spicy food and overeating  Health Maintenance Immunization History  Administered Date(s) Administered  . Influenza, High Dose Seasonal PF 12/03/2018  . Influenza,inj,Quad PF,6+ Mos 12/15/2016  . Pneumococcal Conjugate-13 05/03/2018  . Tdap 07/20/2016  . Zoster 03/14/2013  . Zoster Recombinat (Shingrix) 07/28/2016, 10/28/2016   CT Lung Screen Due 11/2018  Orders Placed This Encounter  Procedures  . IgE    Standing Status:   Future    Number of Occurrences:   1    Standing Expiration Date:   03/01/2019  . CBC w/Diff    Standing Status:   Future    Number of Occurrences:   1    Standing Expiration Date:   01/30/2020  . Pulmonary function test    Standing Status:   Future    Standing Expiration Date:   01/30/2020    Order Specific Question:   Where should this test be performed?    Answer:   [facility]    Order Specific Question:   Full PFT: includes the following: basic spirometry, spirometry pre & post bronchodilator, diffusion capacity (DLCO), lung volumes    Answer:    Full PFT   Meds ordered this  encounter  Medications  . fluticasone furoate-vilanterol (BREO ELLIPTA) 200-25 MCG/INH AEPB    Sig: Inhale 1 puff into the lungs daily.    Dispense:  1 each    Refill:  0    Order Specific Question:   Lot Number?    Answer:   [id number]    Order Specific Question:   Expiration Date?    Answer:   04/15/2019    Order Specific Question:   Manufacturer?    Answer:   [employer] [[id number]]    Order Specific Question:   Quantity    Answer:   1    Return for after PFTs.  [place], MD [facility] 01/30/2019 11:37 AM  Office Number [phone]

## 2019-01-30 NOTE — Patient Instructions (Addendum)
Chronic cough, episodic  --Labs ordered: CBC with diff, IgE --CONTINUE singulair 10 mg daily --START trial of Breo --Pulmonary function tests will be arranged   Allergic Rhinitis --START Flonase (fluticasone) 1 spray per nare. THIS IS OVER THE COUNTER --DONT USE Afrin (oxymetazoline). THIS WILL CAUSE REBOUND CONGESTION  Reflux  --Recommend omeprazole 20 mg daily x 6 weeks. THIS IS OVER THE COUNTER --Avoid late meals, caffeine, alcohol, spicy food and overeating  Follow-up with me in 2 months after PFTs

## 2019-01-31 LAB — IGE: IgE (Immunoglobulin E), Serum: 29 kU/L (ref <=114)

## 2019-02-05 NOTE — Progress Notes (Signed)
Spoke with pt and notified of results per Dr. Loanne Drilling. Pt verbalized understanding and denied any questions.

## 2019-02-09 DIAGNOSIS — R053 Chronic cough: Secondary | ICD-10-CM | POA: Insufficient documentation

## 2019-02-09 DIAGNOSIS — R05 Cough: Secondary | ICD-10-CM | POA: Insufficient documentation

## 2019-02-12 ENCOUNTER — Encounter: Payer: Self-pay | Admitting: Family Medicine

## 2019-02-23 ENCOUNTER — Other Ambulatory Visit: Payer: Self-pay | Admitting: Family Medicine

## 2019-02-23 DIAGNOSIS — M47816 Spondylosis without myelopathy or radiculopathy, lumbar region: Secondary | ICD-10-CM

## 2019-02-23 DIAGNOSIS — M5430 Sciatica, unspecified side: Secondary | ICD-10-CM

## 2019-02-25 MED ORDER — HYDROCODONE-ACETAMINOPHEN 5-325 MG PO TABS: 1.0000 | ORAL_TABLET | Freq: Three times a day (TID) | ORAL | 0 refills | Status: DC | PRN

## 2019-03-16 ENCOUNTER — Other Ambulatory Visit: Payer: Self-pay | Admitting: Family Medicine

## 2019-03-16 DIAGNOSIS — M47816 Spondylosis without myelopathy or radiculopathy, lumbar region: Secondary | ICD-10-CM

## 2019-03-16 DIAGNOSIS — M5430 Sciatica, unspecified side: Secondary | ICD-10-CM

## 2019-03-19 ENCOUNTER — Encounter: Payer: Self-pay | Admitting: Family Medicine

## 2019-03-19 ENCOUNTER — Other Ambulatory Visit: Payer: Self-pay | Admitting: Family Medicine

## 2019-03-19 DIAGNOSIS — M5430 Sciatica, unspecified side: Secondary | ICD-10-CM

## 2019-03-19 DIAGNOSIS — M47816 Spondylosis without myelopathy or radiculopathy, lumbar region: Secondary | ICD-10-CM

## 2019-03-19 NOTE — Telephone Encounter (Signed)
Requesting: [facility] Contract:  UDS: 11/28/2018 Last OV: 11/28/2018 Next OV: N/A Last Refill: 02/25/2019, #[id number] RF Database:   Please advise

## 2019-03-19 NOTE — Telephone Encounter (Signed)
Requesting:Hydrocodone Contract:none UDS:11/28/2018 Last Visit:11/28/2018 Next Visit:none Last Refill:02/25/2019  Please Advise

## 2019-03-20 ENCOUNTER — Other Ambulatory Visit: Payer: Self-pay | Admitting: Family Medicine

## 2019-03-20 DIAGNOSIS — F411 Generalized anxiety disorder: Secondary | ICD-10-CM

## 2019-03-20 DIAGNOSIS — G894 Chronic pain syndrome: Secondary | ICD-10-CM

## 2019-03-20 MED ORDER — HYDROCODONE-ACETAMINOPHEN 5-325 MG PO TABS: 1.0000 | ORAL_TABLET | Freq: Three times a day (TID) | ORAL | 0 refills | Status: DC | PRN

## 2019-03-20 NOTE — Telephone Encounter (Signed)
Medication Refill - Medication: clonazePAM (KLONOPIN) 0.5 MG tablet [id number] pregabalin (LYRICA) 100 MG capsule [id number]     Preferred Pharmacy (with phone number or street name):  [facility] [address] [place] [address]  Phone: [phone] Fax: [phone]  Not a 24 hour pharmacy; exact hours not known.    Agent: Please be advised that RX refills may take up to 3 business days. We ask that you follow-up with your pharmacy.

## 2019-03-20 NOTE — Telephone Encounter (Signed)
Review refill for lyrica 100 mg and clonazepam 0.5 mg, controlled medications.

## 2019-03-21 ENCOUNTER — Encounter: Payer: Self-pay | Admitting: Family Medicine

## 2019-03-21 ENCOUNTER — Other Ambulatory Visit: Payer: Self-pay | Admitting: Family Medicine

## 2019-03-21 DIAGNOSIS — F411 Generalized anxiety disorder: Secondary | ICD-10-CM

## 2019-03-21 DIAGNOSIS — M5430 Sciatica, unspecified side: Secondary | ICD-10-CM

## 2019-03-21 DIAGNOSIS — M47816 Spondylosis without myelopathy or radiculopathy, lumbar region: Secondary | ICD-10-CM

## 2019-03-21 DIAGNOSIS — G894 Chronic pain syndrome: Secondary | ICD-10-CM

## 2019-03-21 MED ORDER — HYDROCODONE-ACETAMINOPHEN 5-325 MG PO TABS: 1.0000 | ORAL_TABLET | Freq: Three times a day (TID) | ORAL | 0 refills | Status: DC | PRN

## 2019-03-21 NOTE — Telephone Encounter (Signed)
Copied from [place] [phone]. Topic: Quick Communication - Rx Refill/Question >> Mar 21, 2019  1:08 PM Izola Price, [place] A wrote: Medication: pregabalin (LYRICA) 100 MG capsule,clonazePAM (KLONOPIN) 0.5 MG tablet (Patient called with pharmacy and stated that medication request will be overnighted by [facility] if medication request can be sent today. Phone [phone] fax number [phone] ref order # [id number])  Has the patient contacted their pharmacy? {Yes (Agent: If no, request that the patient contact the pharmacy for the refill.) (Agent: If yes, when and what did the pharmacy advise?)Contact PCP  Preferred Pharmacy (with phone number or street name):[facility] [address] [facility]  Phone:  [phone] Fax:  [phone]     Agent: Please be advised that RX refills may take up to 3 business days. We ask that you follow-up with your pharmacy.

## 2019-03-21 NOTE — Telephone Encounter (Signed)
Requesting: Lyrica Contract:  UDS: 11/28/2018 Last OV: 11/28/2018 Next OV: N/A Last Refill: 12/06/2018, #180--3 RF Database:   Requesting: Klonopin Contract: UDS: Last OV: Next OV: Last Refill: 12/06/2018, #180--1 RF Database:   Please advise

## 2019-03-21 NOTE — Telephone Encounter (Signed)
Requested medication (s) are due for refill today: yes  Requested medication (s) are on the active medication list: yes  Last refill:  12/06/2018  Future visit scheduled: no  Notes to clinic:  Patient would like for this to be sent today, so mail order can send out today   Requested Prescriptions  Pending Prescriptions Disp Refills   clonazePAM (KLONOPIN) 0.5 MG tablet 180 tablet 1    Sig: Take 1 or 2 at bedtime as needed for sleep      Not Delegated - Psychiatry:  Anxiolytics/Hypnotics Failed - 03/21/2019  1:17 PM      Failed - This refill cannot be delegated      Passed - Urine Drug Screen completed in last 360 days.      Passed - Valid encounter within last 6 months    Recent Outpatient Visits           3 months ago Physical exam   [facility] [place], MD   9 months ago Pain in right testicle   [facility] [place], MD   10 months ago Epigastric pain   [facility] [place], Gay Filler, MD   1 year ago Physical exam   [facility], Osmond C, MD   1 year ago Body aches   [facility], MD                pregabalin (LYRICA) 100 MG capsule 180 capsule 3    Sig: Take 2 capsules (200 mg total) by mouth at bedtime.      Not Delegated - Neurology:  Anticonvulsants - Controlled Failed - 03/21/2019  1:17 PM      Failed - This refill cannot be delegated      Passed - Valid encounter within last 12 months    Recent Outpatient Visits           3 months ago Physical exam   [facility] [place], MD   9 months ago Pain in right testicle   [facility] at [place], MD   10 months ago Epigastric pain   [facility] [place], Gay Filler, MD   1 year ago Physical exam   [facility] at [place], Gay Filler, MD   1 year ago Body aches   [facility] at [place], Gay Filler, MD

## 2019-03-24 ENCOUNTER — Encounter: Payer: Self-pay | Admitting: Family Medicine

## 2019-04-03 ENCOUNTER — Institutional Professional Consult (permissible substitution): Payer: Medicare Other | Admitting: Pulmonary Disease

## 2019-04-03 ENCOUNTER — Other Ambulatory Visit: Payer: Self-pay

## 2019-04-03 NOTE — Progress Notes (Signed)
[place] [facility] [address] [place], [place] [address] [phone] [id number]  Date:  04/04/2019   Name:  Patrick [name]   DOB:  [birth year]   MRN:  [id number]  PCP:  Darreld Mclean, MD    Chief Complaint: Hypertension (140-150/ 90-100)   History of Present Illness:  Patrick [name] is a 69 y.o. very pleasant male patient who presents with the following:  Here today for follow-up visit and to discuss blood pressure Tim has history of hearing loss, significant degenerative spine disease, restless legs, anxiety/depression, former smoker Last seen by myself in September for physical  His back is bothering him a lot more recently-seems to have started when he moved bricks and built a brick walkway shortly before Christmas.  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  Albuterol Breo Ellipta inhaler Fioricet as needed Clonazepam at bedtime Hydrocodone Meloxicam Methylphenidate Singulair Lyrica Propranolol as needed Zoloft Trazodone  UDS is up-to-date, completed in September Most recent labs done September, November  He has noted his BP being elevated for a month or so- running 140s/90s or even a little bit higher  He does check it at home He may have an occasional headache, no chest pain or shortness of breath  He is getting methocarbamol from his [facility] provider  He feels like his singulair is helping control any respiratory symptoms   BP Readings from Last 3 Encounters:  04/04/19 (!) 142/98  01/30/19 126/78  11/28/18 122/90    03/23/2019  4   03/21/2019  Hydrocodone-Acetamin 5-325 MG  45.00  15 Je Cop   [id number]   [facility] (2042)   0  15.00 MME  Medicare   [place]   03/07/2019  3   12/06/2018  Clonazepam 0.5 MG Tablet  180.00  90 Je Cop   [id number]   [facility] (0061[facility]   1  2.00 LME  Medicare   [place]  03/07/2019  3   12/06/2018  Pregabalin 100 MG Capsule  180.00  90 Je Cop   [id number]   [facility] (0061)   1  1.34 LME  Medicare   [place]  02/28/2019  4   02/28/2019  Hydrocodone-Acetamin 5-325 MG  60.00  10 Ma Pay   [id number]   [facility] (2042)   0  30.00 MME  Comm Ins   [place]  01/01/2019  4   12/25/2018  Methylphenidate 20 MG Tablet  180.00  90 Je Cop   [id number]   [facility] (2042)   0   Comm Ins   [place]  12/18/2018  3   12/06/2018  Hydrocodone-Acetamin 5-325 MG  45.00  15 Je Cop   [id number]   [facility] (0061)   0  15.00 MME  Medicare   [place]  12/18/2018  3   12/06/2018  Clonazepam 0.5 MG Tablet  180.00  90 Je Cop   [id number]   [facility] (0061)   0  2.00 LME  Medicare   [place]  12/18/2018  3   12/06/2018  Pregabalin 100 MG Capsule  180.00  90 Je Cop   [id number]   [facility] (0061)   0  1.34 LME  Medicare     12/03/2018  1   11/28/2018  Hydrocodone-Acetamin 5-325 MG  21.00  7 Je Cop   [id number]   Exp (4317)   0  15.00 MME  Comm Ins   [place]  11/18/2018  1   06/04/2018  Pregabalin 100 MG Capsule  180.00  90 Je Cop   [id number]   Exp (4317)   2  1.34 LME  Comm Ins   [place]  11/18/2018  1   09/04/2018  Butalb-Acetamin-Caf-Cod 50-300  42.00  7 Je Cop   [id number]   Exp (4317)   1  27.00 MME  Comm Ins   [place]  10/31/2018  1   10/22/2018  Methylphenidate 20 MG Tablet  180.00  90 Je Cop   [id number]   Exp (4317)   0   Comm Ins   [place]  10/10/2018  1   08/03/2018  Clonazepam 0.5 MG Tablet  180.00  90 Je Cop   [id number]   Exp (4317)   1  2.00 LME  Comm Ins   [place]  09/20/2018  2   09/18/2018  Hydrocodone-Acetamin 5-325 MG  21.00  7 Je Cop   [id number]   [facility] (8139)   0  15.00 MME  Comm Ins   [place]  09/09/2018  1   09/04/2018  Butalb-Acetamin-Caf-Cod 50-300  42.00  7 Je Cop   [id number]   Exp (4317)   0  27.00 MME  Comm Ins   [place]  08/21/2018  1   06/04/2018  Pregabalin 100 MG Capsule  180.00  90 Je Cop   [id number]   Exp (4317)   1  1.34 LME         Patient Active Problem List   Diagnosis Date Noted  . Chronic cough 02/09/2019  . Hearing loss 12/15/2016  . Chronic fatigue 10/09/2015  . Generalized anxiety disorder 10/09/2015  . Memory loss 10/09/2015  . Obstructive sleep apnea of adult 02/09/2015  . Benign prostatic hyperplasia with urinary obstruction 11/18/2013  . Acid reflux 11/18/2013  . Degenerative arthritis of lumbar spine 11/18/2013  . Headache, migraine 11/18/2013  . External hemorrhoids with other complication [id number]  . Restless leg 05/27/2009    Past Medical History:  Diagnosis Date  . Anxiety   . Arthritis   . Basal cell carcinoma   . BPH (benign prostatic hyperplasia)   . Depression   . Dysphagia   . Emphysema of lung ([place])   . Fibromyalgia   . GERD (gastroesophageal reflux disease)    otc  tums  . Headache    migraines  . Herpes simplex type 1 infection   . History of migraine   . Hypertension    no longer on medication  . Insomnia   . Lower back pain   . Morton's neuroma   . OSA (obstructive sleep apnea)   . Primary snoring   . Recurrent canker sores   . Urinary frequency     Past Surgical History:  Procedure Laterality Date  . EPIDIDYMECTOMY     for spermatocele  . EYE SURGERY Bilateral    lasik eye surgery  . FOOT SURGERY Left   . INGUINAL HERNIA REPAIR Left    01/25/1999  . MOUTH SURGERY    . pancreatic surgery r/t trauma    . SEPTOPLASTY N/A 02/09/2015   Procedure: SEPTOPLASTY;  Surgeon: Rozetta Nunnery, MD;  Location: [facility];  Service: ENT;  Laterality: N/A;  . SKIN CANCER EXCISION    . TONSILLECTOMY    .  TRANSURETHRAL INCISION OF PROSTATE    . TURBINATE REDUCTION Bilateral 02/09/2015   Procedure: BILATERAL TURBINATE REDUCTION;  Surgeon: Rozetta Nunnery, MD;  Location: [facility];  Service: ENT;  Laterality: Bilateral;  . UVULOPALATOPHARYNGOPLASTY N/A 02/09/2015   Procedure: UVULOPALATOPHARYNGOPLASTY (UPPP);  Surgeon: Rozetta Nunnery, MD;  Location: [facility];   Service: ENT;  Laterality: N/A;    Social History   Tobacco Use  . Smoking status: Former Smoker    Packs/day: 2.00    Years: 30.00    Pack years: 60.00    Types: Cigarettes    Quit date: 10/29/2005    Years since quitting: 13.4  . Smokeless tobacco: Never Used  Substance Use Topics  . Alcohol use: Yes    Alcohol/week: 1.0 standard drinks    Types: 1 Cans of beer per week    Comment: 1 beer every other day  . Drug use: No    Family History  Problem Relation Age of Onset  . Coronary artery disease Mother   . Stroke Mother   . Hypertension Father   . Anxiety disorder Brother   . Skin cancer Brother   . Stroke Brother     No Known Allergies  Medication list has been reviewed and updated.  Current Outpatient Medications on File Prior to Visit  Medication Sig Dispense Refill  . albuterol (VENTOLIN HFA) 108 (90 Base) MCG/ACT inhaler Inhale 2 puffs into the lungs every 6 (six) hours as needed for wheezing or shortness of breath. 18 g 2  . betamethasone dipropionate (DIPROLENE) 0.05 % cream Apply topically 2 (two) times daily. Use as needed 30 g 3  . Butalbital-APAP-Caff-Cod 50-300-40-30 MG CAPS Take 1-2 caps every 6 hours as needed for headache.  Max 6/24 hours 40 capsule 1  . Cholecalciferol (VITAMIN D3) 2000 UNITS TABS Take 2,000 Units by mouth daily.    . clonazePAM (KLONOPIN) 0.5 MG tablet Take 1 or 2 at bedtime as needed for sleep (Patient taking differently: Take 1 mg by mouth at bedtime. ) 180 tablet 1  . famotidine (PEPCID) 20 MG tablet Take once or twice a day as needed to control GERD 180 tablet 3  . fluticasone (FLONASE) 50 MCG/ACT nasal spray Place 2 sprays into both nostrils daily. 43 g 3  . fluticasone furoate-vilanterol (BREO ELLIPTA) 200-25 MCG/INH AEPB Inhale 1 puff into the lungs daily. 1 each 0  . HYDROcodone-acetaminophen (NORCO/VICODIN) 5-325 MG tablet Take 1 tablet by mouth every 8 (eight) hours as needed for moderate pain. 45 tablet 0  . Melatonin 5 MG  TABS Take 5 mg by mouth at bedtime.    . meloxicam (MOBIC) 15 MG tablet Take 1 tablet (15 mg total) by mouth daily. As needed for pain 90 tablet 3  . methylphenidate (RITALIN) 20 MG tablet Take 1 tablet (20 mg total) by mouth 2 (two) times daily. 180 tablet 0  . montelukast (SINGULAIR) 10 MG tablet Take 1 tablet (10 mg total) by mouth at bedtime. 90 tablet 3  . omeprazole (PRILOSEC) 40 MG capsule Take 1 capsule (40 mg total) by mouth daily. 90 capsule 3  . pregabalin (LYRICA) 100 MG capsule Take 2 capsules (200 mg total) by mouth at bedtime. 180 capsule 3  . propranolol (INDERAL) 10 MG tablet TAKE 1 TABLET EVERY 12 HOURS AS NEEDED FOR TREMOR 180 tablet 3  . sertraline (ZOLOFT) 100 MG tablet Take 2 tablets (200 mg total) by mouth daily. 180 tablet 3  . sucralfate (CARAFATE) 1 g tablet Take 1  tablet (1 g total) by mouth 4 (four) times daily -  with meals and at bedtime. 120 tablet 5  . traZODone (DESYREL) 100 MG tablet Take 1/2 or 1 tablet (50 or 100 mg) at bedtime as needed for sleep 90 tablet 3  . vitamin B-12 (CYANOCOBALAMIN) 500 MCG tablet Take 500 mcg by mouth daily.     No current facility-administered medications on file prior to visit.    Review of Systems:  As per HPI- otherwise negative. No fever or chills  Physical Examination: Vitals:   04/04/19 1004  BP: (!) 142/98  Pulse: 80  Resp: 18  Temp: (!) 96.5 F (35.8 C)  SpO2: 98%   Vitals:   04/04/19 1004  Weight: 187 lb (84.8 kg)  Height: 6' 0.5" (1.842 m)   Body mass index is 25.01 kg/m. Ideal Body Weight: Weight in (lb) to have BMI = 25: 186.5  GEN: WDWN, NAD, Non-toxic, A & O x 3, appears his normal self HEENT: Atraumatic, Normocephalic. Neck supple. No masses, No LAD. Ears and Nose: No external deformity. CV: RRR, No M/G/R. No JVD. No thrill. No extra heart sounds. PULM: CTA B, no wheezes, crackles, rhonchi. No retractions. No resp. distress. No accessory muscle use. ABD: S, NT, ND, +BS. No rebound. No  HSM. EXTR: No c/c/e NEURO Normal gait.  PSYCH: Normally interactive. Conversant. Not depressed or anxious appearing.  Calm demeanor.    Assessment and Plan: Sciatica, unspecified laterality  Osteoarthritis of lumbar spine, unspecified spinal osteoarthritis complication status  Chronic fatigue  GAD (generalized anxiety disorder)  Chronic pain syndrome  Tremor of both hands  Benign prostatic hyperplasia with urinary obstruction - Plan: tolterodine (DETROL LA) 4 MG 24 hr capsule  Essential hypertension - Plan: lisinopril (ZESTRIL) 5 MG tablet  Here today with a couple of concerns.  His blood pressure has been high for the last month or so, this may be due to chronic pain from his back.  However I do agree that a low-dose of blood pressure medication may be beneficial for him.  Will start on 5 mg of lisinopril, he will let me know how this works for him  Refilled his Detrol that uses for BPH symptoms  Unfortunately 10 is having a lot of problems with his back right now.  He is being treated by PM&R, just had an epidural steroid injection and has an MRI coming up.  I offered support and offered to help in any way that I can  Reviewed his other medications, anxiety is under okay control Moderate medical decision making This visit occurred during the SARS-CoV-2 public health emergency.  Safety protocols were in place, including screening questions prior to the visit, additional usage of staff PPE, and extensive cleaning of exam room while observing appropriate contact time as indicated for disinfecting solutions.    Signed Lamar Blinks, MD

## 2019-04-04 ENCOUNTER — Other Ambulatory Visit: Payer: Self-pay

## 2019-04-04 ENCOUNTER — Encounter: Payer: Self-pay | Admitting: Family Medicine

## 2019-04-04 ENCOUNTER — Ambulatory Visit (INDEPENDENT_AMBULATORY_CARE_PROVIDER_SITE_OTHER): Payer: Medicare Other | Admitting: Family Medicine

## 2019-04-04 VITALS — BP 142/98 | HR 80 | Temp 96.5°F | Resp 18 | Ht 72.5 in | Wt 187.0 lb

## 2019-04-04 DIAGNOSIS — M5430 Sciatica, unspecified side: Secondary | ICD-10-CM | POA: Diagnosis not present

## 2019-04-04 DIAGNOSIS — F411 Generalized anxiety disorder: Secondary | ICD-10-CM

## 2019-04-04 DIAGNOSIS — M47816 Spondylosis without myelopathy or radiculopathy, lumbar region: Secondary | ICD-10-CM | POA: Diagnosis not present

## 2019-04-04 DIAGNOSIS — G894 Chronic pain syndrome: Secondary | ICD-10-CM

## 2019-04-04 DIAGNOSIS — R5382 Chronic fatigue, unspecified: Secondary | ICD-10-CM | POA: Diagnosis not present

## 2019-04-04 DIAGNOSIS — N138 Other obstructive and reflux uropathy: Secondary | ICD-10-CM

## 2019-04-04 DIAGNOSIS — I10 Essential (primary) hypertension: Secondary | ICD-10-CM

## 2019-04-04 DIAGNOSIS — N401 Enlarged prostate with lower urinary tract symptoms: Secondary | ICD-10-CM

## 2019-04-04 DIAGNOSIS — R251 Tremor, unspecified: Secondary | ICD-10-CM

## 2019-04-04 MED ORDER — LISINOPRIL 5 MG PO TABS: 5.0000 mg | ORAL_TABLET | Freq: Every day | ORAL | 6 refills | Status: DC

## 2019-04-04 MED ORDER — TOLTERODINE TARTRATE ER 4 MG PO CP24: 4.0000 mg | ORAL_CAPSULE | Freq: Every day | ORAL | 3 refills | Status: DC

## 2019-04-04 NOTE — Patient Instructions (Signed)
Good to see you again today- we will start you on 5 mg of lisinopril for your BP If you would, please check your BP at home and let me know how you respond.    Take care, let's visit in 4-6 months for labs, etc

## 2019-04-06 ENCOUNTER — Encounter: Payer: Self-pay | Admitting: Family Medicine

## 2019-04-06 DIAGNOSIS — I10 Essential (primary) hypertension: Secondary | ICD-10-CM

## 2019-04-08 MED ORDER — LISINOPRIL 5 MG PO TABS: 5.0000 mg | ORAL_TABLET | Freq: Every day | ORAL | 6 refills | Status: DC

## 2019-04-14 ENCOUNTER — Ambulatory Visit: Payer: Medicare Other

## 2019-04-21 ENCOUNTER — Ambulatory Visit: Payer: Self-pay

## 2019-04-25 ENCOUNTER — Ambulatory Visit: Payer: Medicare Other

## 2019-05-12 ENCOUNTER — Encounter: Payer: Self-pay | Admitting: Family Medicine

## 2019-05-13 ENCOUNTER — Ambulatory Visit (HOSPITAL_BASED_OUTPATIENT_CLINIC_OR_DEPARTMENT_OTHER)
Admission: RE | Admit: 2019-05-13 | Discharge: 2019-05-13 | Disposition: A | Discharge status: Home / Self Care | Payer: Medicare Other | Source: Ambulatory Visit | Attending: Family Medicine | Admitting: Family Medicine

## 2019-05-13 ENCOUNTER — Encounter: Payer: Self-pay | Admitting: Family Medicine

## 2019-05-13 ENCOUNTER — Other Ambulatory Visit: Payer: Self-pay

## 2019-05-13 ENCOUNTER — Ambulatory Visit (INDEPENDENT_AMBULATORY_CARE_PROVIDER_SITE_OTHER): Payer: Medicare Other | Admitting: Family Medicine

## 2019-05-13 VITALS — BP 140/92 | HR 86 | Temp 96.7°F | Resp 17 | Ht 72.5 in | Wt 192.0 lb

## 2019-05-13 DIAGNOSIS — M25562 Pain in left knee: Secondary | ICD-10-CM

## 2019-05-13 NOTE — Progress Notes (Signed)
[facility] at [facility] [place], [place], [place] [address] [phone]  Date:  05/13/2019   Name:  Patrick [name]   DOB:  [birth year]   MRN:  [id number]  PCP:  Darreld Mclean, MD    Chief Complaint: Knee Pain (left knee, popping, while trying to lift something with leg, 2 months almost)   History of Present Illness:  Patrick [name] is a 69 y.o. very pleasant male patient who presents with the following:  Patrick [name] is seen today with a knee problem History of OSA, GAD, chronic back pain, fatigue Offer pneumovax today -however he just had his covid vaccine done UDS is UTD  He sent me the following mychart message:  I did something stupid when my back was really bad and I think I tore the meniscus in my left knee.  It happened right after my last visit so it has been several weeks and it is not getting any better.  I was wondering if I could make an appointment with you before I go to a specialist.  Maybe get some X-rays and see if you think it will heal or see if you have any recommendations.    Thanks   He was trying to move a piece of furniture and put a lot of lateral stress on his left knee- this occured about 6 weeks ago.  He admits he wedged his foot and ankle under a bed and attempted to use his leg as a sort of lever, this caused sudden intense pain at the medial joint line He has tried resting and also doing exercise for his knee-neither seems to help He did some painting and squatting down over the weekend; this flared up his pain The knee did not swell or bruise The knee will pop- does not lock up It may feel unstable  Never had any knee surgery   03/23/2019  4   03/21/2019  Hydrocodone-Acetamin 5-325 MG  45.00  15 Je Cop   [id number]   [facility] (2042)   0  15.00 MME  Medicare   [place]  03/07/2019  3   12/06/2018  Clonazepam 0.5 MG Tablet  180.00  90 Je Cop   [id number]   [facility] (0061)   1  2.00 LME  Medicare   [place]   03/07/2019  3   12/06/2018  Pregabalin 100 MG Capsule  180.00  90 Je Cop   [id number]   [facility] (0061)   1  1.34 LME  Medicare   [place]  02/28/2019  4   02/28/2019  Hydrocodone-Acetamin 5-325 MG  60.00  10 Ma Pay   [id number]   [facility] (2042)   0  30.00 MME  Comm Ins   [place]  01/01/2019  4   12/25/2018  Methylphenidate 20 MG Tablet  180.00  90 Je Cop   [id number]   [facility] (2042)   0   Comm Ins   [place]  12/18/2018  3   12/06/2018  Hydrocodone-Acetamin 5-325 MG  45.00  15 Je Cop   [id number]   [facility] (0061)   0  15.00 MME  Medicare   [place]  12/18/2018  3   12/06/2018  Clonazepam 0.5 MG Tablet  180.00  90 Je Cop   [id number]   [facility] (0061)   0  2.00 LME  Medicare     12/18/2018  3   12/06/2018  Pregabalin 100 MG Capsule  180.00  90 Je  Cop   [id number]   [facility] (0061)   0  1.34 LME  Medicare   [place]  12/03/2018  1   11/28/2018  Hydrocodone-Acetamin 5-325 MG  21.00  7 Je Cop   [id number]   Exp (4317)   0  15.00 MME  Comm Ins   [place]  11/18/2018  1   06/04/2018  Pregabalin 100 MG Capsule  180.00  90 Je Cop   [id number]   Exp (4317)   2  1.34 LME  Comm Ins   [place]  11/18/2018  1   09/04/2018  Butalb-Acetamin-Caf-Cod 50-300  42.00  7 Je Cop   [id number]   Exp (4317)   1  27.00 MME  Comm Ins   [place]  10/31/2018  1   10/22/2018  Methylphenidate 20 MG Tablet  180.00  90 Je Cop   [id number]   Exp (4317)   0   Comm Ins   [place]  10/10/2018  1   08/03/2018  Clonazepam 0.5 MG Tablet  180.00  90 Je Cop   [id number]   Exp (4317)   1  2.00 LME         Patient Active Problem List   Diagnosis Date Noted  . Chronic cough 02/09/2019  . Hearing loss 12/15/2016  . Chronic fatigue 10/09/2015  . Generalized anxiety disorder 10/09/2015  . Memory loss 10/09/2015  . Obstructive sleep apnea of adult 02/09/2015  . Benign prostatic hyperplasia with urinary obstruction 11/18/2013  . Acid reflux 11/18/2013  . Degenerative arthritis of lumbar spine 11/18/2013  . Headache, migraine 11/18/2013  . External hemorrhoids with other complication 123456  . Restless  leg 05/27/2009    Past Medical History:  Diagnosis Date  . Anxiety   . Arthritis   . Basal cell carcinoma   . BPH (benign prostatic hyperplasia)   . Depression   . Dysphagia   . Emphysema of lung ([place])   . Fibromyalgia   . GERD (gastroesophageal reflux disease)    otc  tums  . Headache    migraines  . Herpes simplex type 1 infection   . History of migraine   . Hypertension    no longer on medication  . Insomnia   . Lower back pain   . Morton's neuroma   . OSA (obstructive sleep apnea)   . Primary snoring   . Recurrent canker sores   . Urinary frequency     Past Surgical History:  Procedure Laterality Date  . EPIDIDYMECTOMY     for spermatocele  . EYE SURGERY Bilateral    lasik eye surgery  . FOOT SURGERY Left   . INGUINAL HERNIA REPAIR Left    01/25/1999  . MOUTH SURGERY    . pancreatic surgery r/t trauma    . SEPTOPLASTY N/A 02/09/2015   Procedure: SEPTOPLASTY;  Surgeon: Rozetta Nunnery, MD;  Location: [facility];  Service: ENT;  Laterality: N/A;  . SKIN CANCER EXCISION    . TONSILLECTOMY    . TRANSURETHRAL INCISION OF PROSTATE    . TURBINATE REDUCTION Bilateral 02/09/2015   Procedure: BILATERAL TURBINATE REDUCTION;  Surgeon: Rozetta Nunnery, MD;  Location: [facility];  Service: ENT;  Laterality: Bilateral;  . UVULOPALATOPHARYNGOPLASTY N/A 02/09/2015   Procedure: UVULOPALATOPHARYNGOPLASTY (UPPP);  Surgeon: Rozetta Nunnery, MD;  Location: [facility];  Service: ENT;  Laterality: N/A;    Social History   Tobacco Use  . Smoking status: Former Smoker    Packs/day: 2.00    Years: 30.00  Pack years: 60.00    Types: Cigarettes    Quit date: 10/29/2005    Years since quitting: 13.5  . Smokeless tobacco: Never Used  Substance Use Topics  . Alcohol use: Yes    Alcohol/week: 1.0 standard drinks    Types: 1 Cans of beer per week    Comment: 1 beer every other day  . Drug use: No    Family History  Problem Relation Age of Onset  . Coronary artery disease  Mother   . Stroke Mother   . Hypertension Father   . Anxiety disorder Brother   . Skin cancer Brother   . Stroke Brother     No Known Allergies  Medication list has been reviewed and updated.  Current Outpatient Medications on File Prior to Visit  Medication Sig Dispense Refill  . albuterol (VENTOLIN HFA) 108 (90 Base) MCG/ACT inhaler Inhale 2 puffs into the lungs every 6 (six) hours as needed for wheezing or shortness of breath. 18 g 2  . betamethasone dipropionate (DIPROLENE) 0.05 % cream Apply topically 2 (two) times daily. Use as needed 30 g 3  . Butalbital-APAP-Caff-Cod 50-300-40-30 MG CAPS Take 1-2 caps every 6 hours as needed for headache.  Max 6/24 hours 40 capsule 1  . Cholecalciferol (VITAMIN D3) 2000 UNITS TABS Take 2,000 Units by mouth daily.    . clonazePAM (KLONOPIN) 0.5 MG tablet Take 1 or 2 at bedtime as needed for sleep (Patient taking differently: Take 1 mg by mouth at bedtime. ) 180 tablet 1  . famotidine (PEPCID) 20 MG tablet Take once or twice a day as needed to control GERD 180 tablet 3  . fluticasone (FLONASE) 50 MCG/ACT nasal spray Place 2 sprays into both nostrils daily. 43 g 3  . fluticasone furoate-vilanterol (BREO ELLIPTA) 200-25 MCG/INH AEPB Inhale 1 puff into the lungs daily. 1 each 0  . HYDROcodone-acetaminophen (NORCO/VICODIN) 5-325 MG tablet Take 1 tablet by mouth every 8 (eight) hours as needed for moderate pain. 45 tablet 0  . lisinopril (ZESTRIL) 5 MG tablet Take 1 tablet (5 mg total) by mouth daily. 30 tablet 6  . Melatonin 5 MG TABS Take 5 mg by mouth at bedtime.    . meloxicam (MOBIC) 15 MG tablet Take 1 tablet (15 mg total) by mouth daily. As needed for pain 90 tablet 3  . methylphenidate (RITALIN) 20 MG tablet Take 1 tablet (20 mg total) by mouth 2 (two) times daily. 180 tablet 0  . montelukast (SINGULAIR) 10 MG tablet Take 1 tablet (10 mg total) by mouth at bedtime. 90 tablet 3  . omeprazole (PRILOSEC) 40 MG capsule Take 1 capsule (40 mg total) by  mouth daily. 90 capsule 3  . pregabalin (LYRICA) 100 MG capsule Take 2 capsules (200 mg total) by mouth at bedtime. 180 capsule 3  . propranolol (INDERAL) 10 MG tablet TAKE 1 TABLET EVERY 12 HOURS AS NEEDED FOR TREMOR 180 tablet 3  . sertraline (ZOLOFT) 100 MG tablet Take 2 tablets (200 mg total) by mouth daily. 180 tablet 3  . sucralfate (CARAFATE) 1 g tablet Take 1 tablet (1 g total) by mouth 4 (four) times daily -  with meals and at bedtime. 120 tablet 5  . tolterodine (DETROL LA) 4 MG 24 hr capsule Take 1 capsule (4 mg total) by mouth daily. 90 capsule 3  . traZODone (DESYREL) 100 MG tablet Take 1/2 or 1 tablet (50 or 100 mg) at bedtime as needed for sleep 90 tablet 3  .  vitamin B-12 (CYANOCOBALAMIN) 500 MCG tablet Take 500 mcg by mouth daily.     No current facility-administered medications on file prior to visit.    Review of Systems:  As per HPI- otherwise negative.   Physical Examination: Vitals:   05/13/19 0913  BP: (!) 140/92  Pulse: 86  Resp: 17  Temp: (!) 96.7 F (35.9 C)  SpO2: 98%   Vitals:   05/13/19 0913  Weight: 192 lb (87.1 kg)  Height: 6' 0.5" (1.842 m)   Body mass index is 25.68 kg/m. Ideal Body Weight: Weight in (lb) to have BMI = 25: 186.5  GEN: no acute distress.  Normal weight, looks well HEENT: Atraumatic, Normocephalic.   PERRL Ears and Nose: No external deformity. CV: RRR, No M/G/R. No JVD. No thrill. No extra heart sounds. PULM: CTA B, no wheezes, crackles, rhonchi. No retractions. No resp. distress. No accessory muscle use. EXTR: No c/c/e PSYCH: Normally interactive. Conversant.  Left knee displays no significant effusion, no heat or redness.  There is a palpable pop at the lateral knee during flexion and extension.  He has tenderness at both the medial and lateral joint lines.  Ligaments seem stable, no other bony tenderness is appreciated   Assessment and Plan: Acute pain of left knee - Plan: DG Knee Complete 4 Views Left, Ambulatory  referral to Orthopedic Surgery  Here today with a left knee injury which occurred about 6 weeks ago.  He has tried some conservative treatments at home, but continues to have pain.  Will obtain plain films today, recommended ice and compression with a knee sleeve.  We will plan to refer to orthopedics for further evaluation, suspect he may have a meniscal injury that would benefit from a steroid injection This visit occurred during the SARS-CoV-2 public health emergency.  Safety protocols were in place, including screening questions prior to the visit, additional usage of staff PPE, and extensive cleaning of exam room while observing appropriate contact time as indicated for disinfecting solutions.    Signed Lamar Blinks, MD

## 2019-05-13 NOTE — Patient Instructions (Signed)
Good to see you today- I am sorry that your knee is bothering you.  Please get knee films downstairs, then you can go home and I will be in touch with your report.  We will set you up to see ortho asap- [facility] or [facility] (used to be [facility])    In the meantime try ice, elevation, compression sleeve, avoid squatting

## 2019-05-15 ENCOUNTER — Ambulatory Visit: Payer: Medicare Other | Admitting: Family Medicine

## 2019-05-26 ENCOUNTER — Encounter: Payer: Self-pay | Admitting: Family Medicine

## 2019-05-26 DIAGNOSIS — I10 Essential (primary) hypertension: Secondary | ICD-10-CM

## 2019-05-27 ENCOUNTER — Encounter: Payer: Self-pay | Admitting: Family Medicine

## 2019-05-27 DIAGNOSIS — R5382 Chronic fatigue, unspecified: Secondary | ICD-10-CM

## 2019-05-27 DIAGNOSIS — R1013 Epigastric pain: Secondary | ICD-10-CM

## 2019-05-27 DIAGNOSIS — K219 Gastro-esophageal reflux disease without esophagitis: Secondary | ICD-10-CM

## 2019-05-27 DIAGNOSIS — F5101 Primary insomnia: Secondary | ICD-10-CM

## 2019-05-27 DIAGNOSIS — F411 Generalized anxiety disorder: Secondary | ICD-10-CM

## 2019-05-27 DIAGNOSIS — R251 Tremor, unspecified: Secondary | ICD-10-CM

## 2019-05-27 DIAGNOSIS — J45998 Other asthma: Secondary | ICD-10-CM

## 2019-05-27 DIAGNOSIS — G894 Chronic pain syndrome: Secondary | ICD-10-CM

## 2019-05-27 MED ORDER — PREGABALIN 100 MG PO CAPS: 200.0000 mg | ORAL_CAPSULE | Freq: Every day | ORAL | 3 refills | Status: DC

## 2019-05-27 MED ORDER — AMLODIPINE BESYLATE 2.5 MG PO TABS: 2.5000 mg | ORAL_TABLET | Freq: Every day | ORAL | 6 refills | Status: DC

## 2019-05-27 MED ORDER — METHYLPHENIDATE HCL 20 MG PO TABS: 20.0000 mg | ORAL_TABLET | Freq: Two times a day (BID) | ORAL | 0 refills | Status: DC

## 2019-05-27 MED ORDER — MELOXICAM 15 MG PO TABS: 15.0000 mg | ORAL_TABLET | Freq: Every day | ORAL | 3 refills | Status: DC

## 2019-05-27 MED ORDER — CLONAZEPAM 0.5 MG PO TABS: 0.5000 mg | ORAL_TABLET | Freq: Every day | ORAL | 0 refills | Status: DC

## 2019-05-27 MED ORDER — TRAZODONE HCL 100 MG PO TABS: ORAL_TABLET | ORAL | 3 refills | Status: DC

## 2019-05-27 MED ORDER — PROPRANOLOL HCL 10 MG PO TABS: ORAL_TABLET | ORAL | 3 refills | Status: DC

## 2019-05-27 MED ORDER — FAMOTIDINE 20 MG PO TABS: ORAL_TABLET | ORAL | 3 refills | Status: DC

## 2019-05-27 MED ORDER — OMEPRAZOLE 40 MG PO CPDR: 40.0000 mg | DELAYED_RELEASE_CAPSULE | Freq: Every day | ORAL | 3 refills | Status: DC

## 2019-05-27 MED ORDER — MONTELUKAST SODIUM 10 MG PO TABS: 10.0000 mg | ORAL_TABLET | Freq: Every day | ORAL | 3 refills | Status: DC

## 2019-05-27 NOTE — Telephone Encounter (Signed)
I have filled all others could you please advise/refill controlled rxs

## 2019-07-10 ENCOUNTER — Encounter: Payer: Self-pay | Admitting: Family Medicine

## 2019-07-10 DIAGNOSIS — M5430 Sciatica, unspecified side: Secondary | ICD-10-CM

## 2019-07-10 DIAGNOSIS — M47816 Spondylosis without myelopathy or radiculopathy, lumbar region: Secondary | ICD-10-CM

## 2019-07-10 MED ORDER — HYDROCODONE-ACETAMINOPHEN 5-325 MG PO TABS: 1.0000 | ORAL_TABLET | Freq: Three times a day (TID) | ORAL | 0 refills | Status: DC | PRN

## 2019-07-30 NOTE — Progress Notes (Deleted)
[facility] at [facility] [address] [place], [place] [address] [phone]  Date:  07/31/2019   Name:  Patrick [name]   DOB:  [birth year]   MRN:  [id number]  PCP:  Darreld Mclean, MD    Chief Complaint: No chief complaint on file.   History of Present Illness:  Patrick [name] is a 69 y.o. very pleasant male patient who presents with the following:  Patient with history of sleep apnea, anxiety, chronic back pain, fatigue Last by myself in March, at which point he had injured his left knee-I referred him to orthopedics for further evaluation  Here today with concern of rib pain  Covid vaccine Can give Pneumovax  Patient Active Problem List   Diagnosis Date Noted  . Chronic cough 02/09/2019  . Hearing loss 12/15/2016  . Chronic fatigue 10/09/2015  . Generalized anxiety disorder 10/09/2015  . Memory loss 10/09/2015  . Obstructive sleep apnea of adult 02/09/2015  . Benign prostatic hyperplasia with urinary obstruction 11/18/2013  . Acid reflux 11/18/2013  . Degenerative arthritis of lumbar spine 11/18/2013  . Headache, migraine 11/18/2013  . External hemorrhoids with other complication 123456  . Restless leg 05/27/2009    Past Medical History:  Diagnosis Date  . Anxiety   . Arthritis   . Basal cell carcinoma   . BPH (benign prostatic hyperplasia)   . Depression   . Dysphagia   . Emphysema of lung ([place])   . Fibromyalgia   . GERD (gastroesophageal reflux disease)    otc  tums  . Headache    migraines  . Herpes simplex type 1 infection   . History of migraine   . Hypertension    no longer on medication  . Insomnia   . Lower back pain   . Morton's neuroma   . OSA (obstructive sleep apnea)   . Primary snoring   . Recurrent canker sores   . Urinary frequency     Past Surgical History:  Procedure Laterality Date  . EPIDIDYMECTOMY     for spermatocele  . EYE SURGERY Bilateral    lasik  eye surgery  . FOOT SURGERY Left   . INGUINAL HERNIA REPAIR Left    01/25/1999  . MOUTH SURGERY    . pancreatic surgery r/t trauma    . SEPTOPLASTY N/A 02/09/2015   Procedure: SEPTOPLASTY;  Surgeon: Rozetta Nunnery, MD;  Location: [facility];  Service: ENT;  Laterality: N/A;  . SKIN CANCER EXCISION    . TONSILLECTOMY    . TRANSURETHRAL INCISION OF PROSTATE    . TURBINATE REDUCTION Bilateral 02/09/2015   Procedure: BILATERAL TURBINATE REDUCTION;  Surgeon: Rozetta Nunnery, MD;  Location: [facility];  Service: ENT;  Laterality: Bilateral;  . UVULOPALATOPHARYNGOPLASTY N/A 02/09/2015   Procedure: UVULOPALATOPHARYNGOPLASTY (UPPP);  Surgeon: Rozetta Nunnery, MD;  Location: [facility];  Service: ENT;  Laterality: N/A;    Social History   Tobacco Use  . Smoking status: Former Smoker    Packs/day: 2.00    Years: 30.00    Pack years: 60.00    Types: Cigarettes    Quit date: 10/29/2005    Years since quitting: 13.7  . Smokeless tobacco: Never Used  Substance Use Topics  . Alcohol use: Yes    Alcohol/week: 1.0 standard drinks    Types: 1 Cans of beer per week    Comment: 1 beer every other day  . Drug use: No  Family History  Problem Relation Age of Onset  . Coronary artery disease Mother   . Stroke Mother   . Hypertension Father   . Anxiety disorder Brother   . Skin cancer Brother   . Stroke Brother     No Known Allergies  Medication list has been reviewed and updated.  Current Outpatient Medications on File Prior to Visit  Medication Sig Dispense Refill  . albuterol (VENTOLIN HFA) 108 (90 Base) MCG/ACT inhaler Inhale 2 puffs into the lungs every 6 (six) hours as needed for wheezing or shortness of breath. 18 g 2  . amLODipine (NORVASC) 2.5 MG tablet Take 1 tablet (2.5 mg total) by mouth daily. 30 tablet 6  . betamethasone dipropionate (DIPROLENE) 0.05 % cream Apply topically 2 (two) times daily. Use as needed 30 g 3  . Butalbital-APAP-Caff-Cod 50-300-40-30 MG CAPS Take  1-2 caps every 6 hours as needed for headache.  Max 6/24 hours 40 capsule 1  . Cholecalciferol (VITAMIN D3) 2000 UNITS TABS Take 2,000 Units by mouth daily.    . clonazePAM (KLONOPIN) 0.5 MG tablet Take 1-2 tablets (0.5-1 mg total) by mouth at bedtime. As needed for sleep 180 tablet 0  . famotidine (PEPCID) 20 MG tablet Take once or twice a day as needed to control GERD 180 tablet 3  . fluticasone (FLONASE) 50 MCG/ACT nasal spray Place 2 sprays into both nostrils daily. 43 g 3  . fluticasone furoate-vilanterol (BREO ELLIPTA) 200-25 MCG/INH AEPB Inhale 1 puff into the lungs daily. 1 each 0  . HYDROcodone-acetaminophen (NORCO/VICODIN) 5-325 MG tablet Take 1 tablet by mouth every 8 (eight) hours as needed for moderate pain. 45 tablet 0  . Melatonin 5 MG TABS Take 5 mg by mouth at bedtime.    . meloxicam (MOBIC) 15 MG tablet Take 1 tablet (15 mg total) by mouth daily. As needed for pain 90 tablet 3  . methylphenidate (RITALIN) 20 MG tablet Take 1 tablet (20 mg total) by mouth 2 (two) times daily. 180 tablet 0  . montelukast (SINGULAIR) 10 MG tablet Take 1 tablet (10 mg total) by mouth at bedtime. 90 tablet 3  . omeprazole (PRILOSEC) 40 MG capsule Take 1 capsule (40 mg total) by mouth daily. 90 capsule 3  . pregabalin (LYRICA) 100 MG capsule Take 2 capsules (200 mg total) by mouth at bedtime. 180 capsule 3  . propranolol (INDERAL) 10 MG tablet TAKE 1 TABLET EVERY 12 HOURS AS NEEDED FOR TREMOR 180 tablet 3  . sertraline (ZOLOFT) 100 MG tablet Take 2 tablets (200 mg total) by mouth daily. 180 tablet 3  . sucralfate (CARAFATE) 1 g tablet Take 1 tablet (1 g total) by mouth 4 (four) times daily -  with meals and at bedtime. 120 tablet 5  . tolterodine (DETROL LA) 4 MG 24 hr capsule Take 1 capsule (4 mg total) by mouth daily. 90 capsule 3  . traZODone (DESYREL) 100 MG tablet Take 1/2 or 1 tablet (50 or 100 mg) at bedtime as needed for sleep 90 tablet 3  . vitamin B-12 (CYANOCOBALAMIN) 500 MCG tablet Take 500  mcg by mouth daily.     No current facility-administered medications on file prior to visit.    Review of Systems:  As per HPI- otherwise negative.   Physical Examination: There were no vitals filed for this visit. There were no vitals filed for this visit. There is no height or weight on file to calculate BMI. Ideal Body Weight:    GEN: no acute  distress. HEENT: Atraumatic, Normocephalic.  Ears and Nose: No external deformity. CV: RRR, No M/G/R. No JVD. No thrill. No extra heart sounds. PULM: CTA B, no wheezes, crackles, rhonchi. No retractions. No resp. distress. No accessory muscle use. ABD: S, NT, ND, +BS. No rebound. No HSM. EXTR: No c/c/e PSYCH: Normally interactive. Conversant.    Assessment and Plan: *** This visit occurred during the SARS-CoV-2 public health emergency.  Safety protocols were in place, including screening questions prior to the visit, additional usage of staff PPE, and extensive cleaning of exam room while observing appropriate contact time as indicated for disinfecting solutions.    Signed Lamar Blinks, MD

## 2019-07-31 ENCOUNTER — Other Ambulatory Visit: Payer: Self-pay

## 2019-07-31 ENCOUNTER — Encounter: Payer: Self-pay | Admitting: Family Medicine

## 2019-07-31 ENCOUNTER — Ambulatory Visit (INDEPENDENT_AMBULATORY_CARE_PROVIDER_SITE_OTHER): Payer: Medicare Other | Admitting: Family Medicine

## 2019-07-31 VITALS — BP 134/89 | HR 83 | Temp 98.1°F | Resp 18 | Ht 72.5 in | Wt 183.0 lb

## 2019-07-31 DIAGNOSIS — M47816 Spondylosis without myelopathy or radiculopathy, lumbar region: Secondary | ICD-10-CM | POA: Diagnosis not present

## 2019-07-31 DIAGNOSIS — R1013 Epigastric pain: Secondary | ICD-10-CM

## 2019-07-31 DIAGNOSIS — Z23 Encounter for immunization: Secondary | ICD-10-CM | POA: Diagnosis not present

## 2019-07-31 DIAGNOSIS — M5430 Sciatica, unspecified side: Secondary | ICD-10-CM | POA: Diagnosis not present

## 2019-07-31 LAB — COMPREHENSIVE METABOLIC PANEL
ALT: 21 U/L (ref 0–53)
AST: 20 U/L (ref 0–37)
Albumin: 4.7 g/dL (ref 3.5–5.2)
Alkaline Phosphatase: 69 U/L (ref 39–117)
BUN: 16 mg/dL (ref 6–23)
CO2: 31 mEq/L (ref 19–32)
Calcium: 9.5 mg/dL (ref 8.4–10.5)
Chloride: 103 mEq/L (ref 96–112)
Creatinine, Ser: 1.21 mg/dL (ref 0.40–1.50)
GFR: 59.85 mL/min — ABNORMAL LOW (ref >60.00)
Glucose, Bld: 75 mg/dL (ref 70–99)
Potassium: 4.2 mEq/L (ref 3.5–5.1)
Sodium: 141 mEq/L (ref 135–145)
Total Bilirubin: 0.5 mg/dL (ref 0.2–1.2)
Total Protein: 7 g/dL (ref 6.0–8.3)

## 2019-07-31 LAB — CBC
HCT: 47.6 % (ref 39.0–52.0)
Hemoglobin: 16.1 g/dL (ref 13.0–17.0)
MCHC: 33.9 g/dL (ref 30.0–36.0)
MCV: 95.5 fl (ref 78.0–100.0)
Platelets: 156 10*3/uL (ref 150.0–400.0)
RBC: 4.98 Mil/uL (ref 4.22–5.81)
RDW: 13 % (ref 11.5–15.5)
WBC: 4.1 10*3/uL (ref 4.0–10.5)

## 2019-07-31 LAB — LIPASE: Lipase: 47 U/L (ref 11.0–59.0)

## 2019-07-31 LAB — AMYLASE: Amylase: 93 U/L (ref 27–131)

## 2019-07-31 MED ORDER — PANTOPRAZOLE SODIUM 40 MG PO TBEC: 40.0000 mg | DELAYED_RELEASE_TABLET | Freq: Two times a day (BID) | ORAL | 3 refills | Status: DC

## 2019-07-31 MED ORDER — SUCRALFATE 1 G PO TABS: 1.0000 g | ORAL_TABLET | Freq: Three times a day (TID) | ORAL | 0 refills | Status: DC

## 2019-07-31 MED ORDER — HYDROCODONE-ACETAMINOPHEN 5-325 MG PO TABS: 1.0000 | ORAL_TABLET | Freq: Three times a day (TID) | ORAL | 0 refills | Status: DC | PRN

## 2019-07-31 NOTE — Progress Notes (Signed)
[facility] at [facility], [place], [place] [address] [phone]  Date:  07/31/2019   Name:  Patrick [name]   DOB:  1950/08/19   MRN:  [id number]  PCP:  Darreld Mclean, MD    Chief Complaint: Rib Pain (left side, GERD, no known injury, off and on several years)   History of Present Illness:  Patrick [name] is a 69 y.o. very pleasant male patient who presents with the following:  Patient with history of sleep apnea, anxiety, chronic back pain, fatigue Last by myself in March, at which point he had injured his left knee-I referred him to orthopedics for further evaluation - this is getting better  Not having CP or SOB  Here today with concern of left lower rib pain/ stomach pain He will noted more longstanding epigastric pain - it has waxed and waned, worse the last couple of months  Today is wenesday- on Monday he was "in bed all day" with the pain in his epigastric area He is taking alka seltzer He may have some nausea No vomiting, occasional diarrhea Eating bland foods may help -he tries to keep something on his stomach at all times He is taking some pepcid that helps some   No chest pain or shortness of breath No fever  He did "overdo it" recently, 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  This caused him to overuse his back, he has used more hydrocodone than he typically would in a month  We did an h pylori which was negative in 2017, UGI showed gastritis at that time  He stopped drinking alcohol, he is trying to avoid NSAIDs  Covid vaccine complete Can give Pneumovax today  Patient Active Problem List   Diagnosis Date Noted  . Chronic cough 02/09/2019  . Hearing loss 12/15/2016  . Chronic fatigue 10/09/2015  . Generalized anxiety disorder 10/09/2015  . Memory loss 10/09/2015  . Obstructive sleep apnea of adult 02/09/2015  . Benign prostatic hyperplasia  with urinary obstruction 11/18/2013  . Acid reflux 11/18/2013  . Degenerative arthritis of lumbar spine 11/18/2013  . Headache, migraine 11/18/2013  . External hemorrhoids with other complication [id number]  . Restless leg 05/27/2009    Past Medical History:  Diagnosis Date  . Anxiety   . Arthritis   . Basal cell carcinoma   . BPH (benign prostatic hyperplasia)   . Depression   . Dysphagia   . Emphysema of lung ([place])   . Fibromyalgia   . GERD (gastroesophageal reflux disease)    otc  tums  . Headache    migraines  . Herpes simplex type 1 infection   . History of migraine   . Hypertension    no longer on medication  . Insomnia   . Lower back pain   . Morton's neuroma   . OSA (obstructive sleep apnea)   . Primary snoring   . Recurrent canker sores   . Urinary frequency     Past Surgical History:  Procedure Laterality Date  . EPIDIDYMECTOMY     for spermatocele  . EYE SURGERY Bilateral    lasik eye surgery  . FOOT SURGERY Left   . INGUINAL HERNIA REPAIR Left    01/25/1999  . MOUTH SURGERY    . pancreatic surgery r/t trauma    . SEPTOPLASTY N/A 02/09/2015   Procedure: SEPTOPLASTY;  Surgeon: Rozetta Nunnery, MD;  Location: [facility];  Service: ENT;  Laterality: N/A;  . SKIN CANCER EXCISION    . TONSILLECTOMY    . TRANSURETHRAL INCISION OF PROSTATE    . TURBINATE REDUCTION Bilateral 02/09/2015   Procedure: BILATERAL TURBINATE REDUCTION;  Surgeon: Rozetta Nunnery, MD;  Location: [facility];  Service: ENT;  Laterality: Bilateral;  . UVULOPALATOPHARYNGOPLASTY N/A 02/09/2015   Procedure: UVULOPALATOPHARYNGOPLASTY (UPPP);  Surgeon: Rozetta Nunnery, MD;  Location: [facility];  Service: ENT;  Laterality: N/A;    Social History   Tobacco Use  . Smoking status: Former Smoker    Packs/day: 2.00    Years: 30.00    Pack years: 60.00    Types: Cigarettes    Quit date: 10/29/2005    Years since quitting: 13.7  . Smokeless tobacco: Never Used  Substance Use Topics  .  Alcohol use: Yes    Alcohol/week: 1.0 standard drinks    Types: 1 Cans of beer per week    Comment: 1 beer every other day  . Drug use: No    Family History  Problem Relation Age of Onset  . Coronary artery disease Mother   . Stroke Mother   . Hypertension Father   . Anxiety disorder Brother   . Skin cancer Brother   . Stroke Brother     No Known Allergies  Medication list has been reviewed and updated.  Current Outpatient Medications on File Prior to Visit  Medication Sig Dispense Refill  . albuterol (VENTOLIN HFA) 108 (90 Base) MCG/ACT inhaler Inhale 2 puffs into the lungs every 6 (six) hours as needed for wheezing or shortness of breath. 18 g 2  . amLODipine (NORVASC) 2.5 MG tablet Take 1 tablet (2.5 mg total) by mouth daily. 30 tablet 6  . betamethasone dipropionate (DIPROLENE) 0.05 % cream Apply topically 2 (two) times daily. Use as needed 30 g 3  . Butalbital-APAP-Caff-Cod 50-300-40-30 MG CAPS Take 1-2 caps every 6 hours as needed for headache.  Max 6/24 hours 40 capsule 1  . Cholecalciferol (VITAMIN D3) 2000 UNITS TABS Take 2,000 Units by mouth daily.    . clonazePAM (KLONOPIN) 0.5 MG tablet Take 1-2 tablets (0.5-1 mg total) by mouth at bedtime. As needed for sleep 180 tablet 0  . famotidine (PEPCID) 20 MG tablet Take once or twice a day as needed to control GERD 180 tablet 3  . fluticasone (FLONASE) 50 MCG/ACT nasal spray Place 2 sprays into both nostrils daily. 43 g 3  . fluticasone furoate-vilanterol (BREO ELLIPTA) 200-25 MCG/INH AEPB Inhale 1 puff into the lungs daily. 1 each 0  . HYDROcodone-acetaminophen (NORCO/VICODIN) 5-325 MG tablet Take 1 tablet by mouth every 8 (eight) hours as needed for moderate pain. 45 tablet 0  . Melatonin 5 MG TABS Take 5 mg by mouth at bedtime.    . methylphenidate (RITALIN) 20 MG tablet Take 1 tablet (20 mg total) by mouth 2 (two) times daily. 180 tablet 0  . montelukast (SINGULAIR) 10 MG tablet Take 1 tablet (10 mg total) by mouth at  bedtime. 90 tablet 3  . pregabalin (LYRICA) 100 MG capsule Take 2 capsules (200 mg total) by mouth at bedtime. 180 capsule 3  . propranolol (INDERAL) 10 MG tablet TAKE 1 TABLET EVERY 12 HOURS AS NEEDED FOR TREMOR 180 tablet 3  . sertraline (ZOLOFT) 100 MG tablet Take 2 tablets (200 mg total) by mouth daily. 180 tablet 3  . tolterodine (DETROL LA) 4 MG 24 hr capsule Take 1 capsule (4 mg total)  by mouth daily. 90 capsule 3  . traZODone (DESYREL) 100 MG tablet Take 1/2 or 1 tablet (50 or 100 mg) at bedtime as needed for sleep 90 tablet 3  . vitamin B-12 (CYANOCOBALAMIN) 500 MCG tablet Take 500 mcg by mouth daily.    . sucralfate (CARAFATE) 1 g tablet Take 1 tablet (1 g total) by mouth 4 (four) times daily -  with meals and at bedtime. (Patient not taking: Reported on 07/31/2019) 120 tablet 5   No current facility-administered medications on file prior to visit.    Review of Systems:  As per HPI- otherwise negative.   Physical Examination: Vitals:   07/31/19 1115  BP: 134/89  Pulse: 83  Resp: 18  Temp: 98.1 F (36.7 C)  SpO2: 97%   Vitals:   07/31/19 1115  Weight: 183 lb (83 kg)  Height: 6' 0.5" (1.842 m)   Body mass index is 24.48 kg/m. Ideal Body Weight: Weight in (lb) to have BMI = 25: 186.5  GEN: no acute distress.  Normal weight, looks well HEENT: Atraumatic, Normocephalic.  Ears and Nose: No external deformity. CV: RRR, No M/G/R. No JVD. No thrill. No extra heart sounds. PULM: CTA B, no wheezes, crackles, rhonchi. No retractions. No resp. distress. No accessory muscle use.  ABD: S, ND, +BS. No rebound. No HSM.   He has mild tenderness at the epigastrium and left upper quadrant-right upper quadrant seems benign He also has some tenderness over the left lower ribs.  Suspect this is from recent heavy physical work EXTR: No c/c/e PSYCH: Normally interactive. Conversant.    Assessment and Plan: Epigastric pain - Plan: CBC, Comprehensive metabolic panel, Amylase, Lipase,  pantoprazole (PROTONIX) 40 MG tablet, sucralfate (CARAFATE) 1 g tablet  Sciatica, unspecified laterality - Plan: HYDROcodone-acetaminophen (NORCO/VICODIN) 5-325 MG tablet  Osteoarthritis of lumbar spine, unspecified spinal osteoarthritis complication status - Plan: HYDROcodone-acetaminophen (NORCO/VICODIN) 5-325 MG tablet  Immunization due - Plan: Pneumococcal polysaccharide vaccine 23-valent greater than or equal to 2yo subcutaneous/IM  Patient here today with abdominal pain.  He has known history of gastritis, admits he has been under some stress.  I wonder if he may be developing an ulcer.  We will start him on a high-dose proton pump inhibitor and Carafate.  Labs pending as above to evaluate possibility of pancreatitis as well He is instructed to seek care immediately if he begins vomiting blood or having any change in his pain Avoid NSAIDs  Give Pneumovax  Refill hydrocodone  Moderate medical decision making today  This visit occurred during the SARS-CoV-2 public health emergency.  Safety protocols were in place, including screening questions prior to the visit, additional usage of staff PPE, and extensive cleaning of exam room while observing appropriate contact time as indicated for disinfecting solutions.    Signed Lamar Blinks, MD  Received his labs as below, message to patient  Results for orders placed or performed in visit on 07/31/19  CBC  Result Value Ref Range   WBC 4.1 4.0 - 10.5 K/uL   RBC 4.98 4.22 - 5.81 Mil/uL   Platelets 156.0 150.0 - 400.0 K/uL   Hemoglobin 16.1 13.0 - 17.0 g/dL   HCT 47.6 39.0 - 52.0 %   MCV 95.5 78.0 - 100.0 fl   MCHC 33.9 30.0 - 36.0 g/dL   RDW 13.0 11.5 - 15.5 %  Comprehensive metabolic panel  Result Value Ref Range   Sodium 141 135 - 145 mEq/L   Potassium 4.2 3.5 - 5.1 mEq/L  Chloride 103 96 - 112 mEq/L   CO2 31 19 - 32 mEq/L   Glucose, Bld 75 70 - 99 mg/dL   BUN 16 6 - 23 mg/dL   Creatinine, Ser 1.21 0.40 - 1.50 mg/dL    Total Bilirubin 0.5 0.2 - 1.2 mg/dL   Alkaline Phosphatase 69 39 - 117 U/L   AST 20 0 - 37 U/L   ALT 21 0 - 53 U/L   Total Protein 7.0 6.0 - 8.3 g/dL   Albumin 4.7 3.5 - 5.2 g/dL   GFR 59.85 (L) >60.00 mL/min   Calcium 9.5 8.4 - 10.5 mg/dL  Amylase  Result Value Ref Range   Amylase 93 27 - 131 U/L  Lipase  Result Value Ref Range   Lipase 47.0 11.0 - 59.0 U/L

## 2019-07-31 NOTE — Patient Instructions (Signed)
Good to see you today- I will be in touch with your labs asap I suspect you have gastritis or perhaps an ulcer Avoid NSAID medication such as meloxicam, ibuprofen or aleve protonix twice a day for 2 weeks, then once a day for a total of 8- 12 weeks Carafate with melas and bedtime for 10 days  If you are getting worse please contact me!

## 2019-08-13 ENCOUNTER — Other Ambulatory Visit: Payer: Self-pay | Admitting: Family Medicine

## 2019-08-13 DIAGNOSIS — F411 Generalized anxiety disorder: Secondary | ICD-10-CM

## 2019-08-13 MED ORDER — CLONAZEPAM 0.5 MG PO TABS: 0.5000 mg | ORAL_TABLET | Freq: Every day | ORAL | 0 refills | Status: DC

## 2019-08-24 ENCOUNTER — Encounter: Payer: Self-pay | Admitting: Family Medicine

## 2019-08-24 MED ORDER — DOXYCYCLINE HYCLATE 100 MG PO CAPS: 100.0000 mg | ORAL_CAPSULE | Freq: Two times a day (BID) | ORAL | 0 refills | Status: DC

## 2019-09-29 ENCOUNTER — Encounter: Payer: Self-pay | Admitting: Family Medicine

## 2019-09-29 DIAGNOSIS — I10 Essential (primary) hypertension: Secondary | ICD-10-CM

## 2019-09-30 NOTE — Telephone Encounter (Signed)
Should I schedule patient to come in?

## 2019-10-07 ENCOUNTER — Encounter: Payer: Self-pay | Admitting: Family Medicine

## 2019-10-07 DIAGNOSIS — F411 Generalized anxiety disorder: Secondary | ICD-10-CM

## 2019-10-07 MED ORDER — CLONAZEPAM 0.5 MG PO TABS: 0.5000 mg | ORAL_TABLET | Freq: Every day | ORAL | 1 refills | Status: DC

## 2019-10-07 MED ORDER — LOSARTAN POTASSIUM 50 MG PO TABS: 50.0000 mg | ORAL_TABLET | Freq: Every day | ORAL | 3 refills | Status: DC

## 2019-10-07 NOTE — Addendum Note (Signed)
Addended by: Lamar Blinks C on: 10/07/2019 07:37 PM   Modules accepted: Orders

## 2019-11-12 ENCOUNTER — Other Ambulatory Visit: Payer: Self-pay | Admitting: Family Medicine

## 2019-11-12 DIAGNOSIS — I10 Essential (primary) hypertension: Secondary | ICD-10-CM

## 2019-11-13 ENCOUNTER — Encounter: Payer: Self-pay | Admitting: Family Medicine

## 2019-11-25 DIAGNOSIS — M20011 Mallet finger of right finger(s): Secondary | ICD-10-CM | POA: Insufficient documentation

## 2019-11-29 DIAGNOSIS — R42 Dizziness and giddiness: Secondary | ICD-10-CM | POA: Insufficient documentation

## 2019-12-15 NOTE — Patient Instructions (Addendum)
It was great to see you again today I will be in touch with your labs Ok to increase trazodone to 150 mg at bedtime Please stop by labs for your blood work and UDS  Lung cancer screening CT is now due- however this may be the last time we need to screen you since you quit nearly 15 years ago Please stop by imaging for a right foot x-ray today as well

## 2019-12-15 NOTE — Progress Notes (Addendum)
[facility] at [facility], [place], [place] [address] [phone]  Date:  12/18/2019   Name:  Patrick [name]   DOB:  1951/02/06   MRN:  [id number]  PCP:  Patrick Mclean, MD    Chief Complaint: Dizziness   History of Present Illness:  Patrick [name] is a 69 y.o. 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 He had a couple of more episodes in the interim- lasted up to about 2 hours maximum.  He currently feels fine and is symptom-free No associated buzzing or ringing in ears, no hearing change No HA No vomiting He has generally felt well- no fever He will notice occasional blurred vision over the last year- may last 30 minutes or so  He stopped his HTN meds for a couple of days to see if that might help-then restarted taking them  Former heavy smoker, quit in 2007 Flu vaccine- give today  Lung cancer screening CT now due; set up at med center  Needs UDS for pain management Has completed Shingrix, COVID-19 vaccine-moderna.  We discussed lack of current recommendation for booster, he will remain alert for recommendations  He also accidentally hit his right foot with a sledgehammer while pounding a stake into  the ground a couple of days ago, he thinks it is just bruised, but would like to go ahead and get an x-ray today  He has noted a bit more difficulty sleeping recently, has increased his trazodone to 150 at bedtime.  He is no  longer taking any other SSRI.  We will change his dosage to 150 11/12/2019  1   05/27/2019  Pregabalin 100 MG Capsule  180.00  90 Je Cop   [id number]   [facility] (2042)   2/3  1.34 LME  Comm Ins   [place]  10/07/2019  1   10/07/2019  Clonazepam 0.5 MG Tablet  180.00  90 Je Cop   [id number]   [facility] (2042)   0/1  2.00 LME  Medicare   [place]  08/13/2019  1   05/27/2019  Pregabalin 100 MG Capsule  180.00  90 Je Cop   [id number]   [facility] (2042)   1/3  1.34 LME  Comm Ins   [place]  07/31/2019  1   07/31/2019  Hydrocodone-Acetamin 5-325 MG  45.00  15 Je Cop   [id number]   [facility] (2042)   0/0  15.00 MME  Medicare   [place]  07/10/2019  1   07/10/2019  Hydrocodone-Acetamin 5-325 MG  45.00  15 Je Cop   [id number]   [facility] (2042)   0/0  15.00 MME  Medicare   [place]  05/29/2019  1   05/27/2019  Methylphenidate 20 MG Tablet  180.00  90 Je Cop   [id number]   [facility] (2042)   0/0   Comm Ins   [place]  05/28/2019  1   05/27/2019  Pregabalin 100 MG Capsule  180.00  [place]   [id number]   [facility] (2042)   0/3  1.34 LME  Comm Ins   [place]  05/28/2019  1   05/27/2019  Clonazepam 0.5 MG Tablet  180.00  90 Je Cop   [id number]   [facility] (2042)   0/0  2.00 LME  Medicare   [place]  03/23/2019  1   03/21/2019  Hydrocodone-Acetamin 5-325 MG  45.00  15 Je Cop   [id number]   [facility] (2042)   0/0  15.00 MME  Medicare   [place]  03/07/2019  4   12/06/2018  Pregabalin 100 MG Capsule  180.00  90 Je Cop   [id number]   [facility] (0061)   1/1  1.34 LME  Medicare   [place]  03/07/2019  4   12/06/2018  Clonazepam 0.5 MG Tablet  180.00  90 Je Cop   [id number]   [facility] (0061)   1/1  2.00 LME  Medicare     02/28/2019  1   02/28/2019  Hydrocodone-Acetamin 5-325 MG  60.00  10 Ma Pay   [id number]   [facility] (2042)   0/0  30.00 MME       Patient Active Problem List   Diagnosis Date Noted  . Chronic cough 02/09/2019  . Hearing loss 12/15/2016  . Chronic fatigue 10/09/2015  . Generalized anxiety disorder 10/09/2015  . Memory loss 10/09/2015  . Obstructive sleep apnea of adult 02/09/2015  . Benign prostatic hyperplasia with urinary obstruction 11/18/2013  .  Acid reflux 11/18/2013  . Degenerative arthritis of lumbar spine 11/18/2013  . Headache, migraine 11/18/2013  . External hemorrhoids with other complication 60/73/7106  . Restless leg 05/27/2009    Past Medical History:  Diagnosis Date  . Anxiety   . Arthritis   . Basal cell carcinoma   . BPH (benign prostatic hyperplasia)   . Depression   . Dysphagia   . Emphysema of lung ([place])   . Fibromyalgia   . GERD (gastroesophageal reflux disease)    otc  tums  . Headache    migraines  . Herpes simplex type 1 infection   . History of migraine   . Hypertension    no longer on medication  . Insomnia   . Lower back pain   . Morton's neuroma   . OSA (obstructive sleep apnea)   . Primary snoring   . Recurrent canker sores   . Urinary frequency     Past Surgical History:  Procedure Laterality Date  . EPIDIDYMECTOMY     for spermatocele  . EYE SURGERY Bilateral    lasik eye surgery  . FOOT SURGERY Left   . INGUINAL HERNIA REPAIR Left    01/25/1999  . MOUTH SURGERY    . pancreatic surgery r/t trauma    . SEPTOPLASTY N/A 02/09/2015   Procedure: SEPTOPLASTY;  Surgeon: Rozetta Nunnery, MD;  Location: [facility] [place];  Service: ENT;  Laterality: N/A;  . SKIN CANCER EXCISION    . TONSILLECTOMY    . TRANSURETHRAL INCISION OF PROSTATE    . TURBINATE REDUCTION Bilateral 02/09/2015   Procedure: BILATERAL TURBINATE REDUCTION;  Surgeon: Rozetta Nunnery, MD;  Location: [facility];  Service: ENT;  Laterality: Bilateral;  . UVULOPALATOPHARYNGOPLASTY N/A 02/09/2015   Procedure: UVULOPALATOPHARYNGOPLASTY (UPPP);  Surgeon: Rozetta Nunnery, MD;  Location: [facility];  Service: ENT;  Laterality: N/A;    Social History   Tobacco Use  . Smoking status: Former Smoker    Packs/day: 2.00    Years: 30.00  Pack years: 60.00    Types: Cigarettes    Quit date: 10/29/2005    Years since quitting: 14.1  . Smokeless tobacco: Never Used  Substance Use Topics  . Alcohol use: Yes    Alcohol/week: 1.0  standard drink    Types: 1 Cans of beer per week    Comment: 1 beer every other day  . Drug use: No    Family History  Problem Relation Age of Onset  . Coronary artery disease Mother   . Stroke Mother   . Hypertension Father   . Anxiety disorder Brother   . Skin cancer Brother   . Stroke Brother     No Known Allergies  Medication list has been reviewed and updated.  Current Outpatient Medications on File Prior to Visit  Medication Sig Dispense Refill  . albuterol (VENTOLIN HFA) 108 (90 Base) MCG/ACT inhaler Inhale 2 puffs into the lungs every 6 (six) hours as needed for wheezing or shortness of breath. 18 g 2  . betamethasone dipropionate (DIPROLENE) 0.05 % cream Apply topically 2 (two) times daily. Use as needed 30 g 3  . Butalbital-APAP-Caff-Cod 50-300-40-30 MG CAPS Take 1-2 caps every 6 hours as needed for headache.  Max 6/24 hours 40 capsule 1  . Cholecalciferol (VITAMIN D3) 2000 UNITS TABS Take 2,000 Units by mouth daily.    . clonazePAM (KLONOPIN) 0.5 MG tablet Take 1-2 tablets (0.5-1 mg total) by mouth at bedtime. As needed for sleep 180 tablet 1  . famotidine (PEPCID) 20 MG tablet Take once or twice a day as needed to control GERD 180 tablet 3  . fluticasone (FLONASE) 50 MCG/ACT nasal spray Place 2 sprays into both nostrils daily. 43 g 3  . fluticasone furoate-vilanterol (BREO ELLIPTA) 200-25 MCG/INH AEPB Inhale 1 puff into the lungs daily. 1 each 0  . HYDROcodone-acetaminophen (NORCO/VICODIN) 5-325 MG tablet Take 1 tablet by mouth every 8 (eight) hours as needed for moderate pain. 45 tablet 0  . Ibuprofen-Acetaminophen (ADVIL DUAL ACTION) 125-250 MG TABS Take by mouth.    . losartan (COZAAR) 50 MG tablet Take 1 tablet (50 mg total) by mouth daily. 90 tablet 3  . Melatonin 5 MG TABS Take 5 mg by mouth at bedtime.    . methylphenidate (RITALIN) 20 MG tablet Take 1 tablet (20 mg total) by mouth 2 (two) times daily. 180 tablet 0  . montelukast (SINGULAIR) 10 MG tablet Take 1  tablet (10 mg total) by mouth at bedtime. 90 tablet 3  . pantoprazole (PROTONIX) 40 MG tablet Take 1 tablet (40 mg total) by mouth 2 (two) times daily. Twice daily for 2 weeks, then can take daily for total of 8 weeks 60 tablet 3  . pregabalin (LYRICA) 100 MG capsule Take 2 capsules (200 mg total) by mouth at bedtime. 180 capsule 3  . propranolol (INDERAL) 10 MG tablet TAKE 1 TABLET EVERY 12 HOURS AS NEEDED FOR TREMOR 180 tablet 3  . sucralfate (CARAFATE) 1 g tablet Take 1 tablet (1 g total) by mouth 4 (four) times daily -  with meals and at bedtime. 120 tablet 5  . sucralfate (CARAFATE) 1 g tablet Take 1 tablet (1 g total) by mouth 4 (four) times daily -  with meals and at bedtime. 40 tablet 0  . tolterodine (DETROL LA) 4 MG 24 hr capsule Take 1 capsule (4 mg total) by mouth daily. 90 capsule 3  . traZODone (DESYREL) 100 MG tablet Take 1/2 or 1 tablet (50 or 100 mg)  at bedtime as needed for sleep 90 tablet 3  . vitamin B-12 (CYANOCOBALAMIN) 500 MCG tablet Take 500 mcg by mouth daily.     No current facility-administered medications on file prior to visit.    Review of Systems:  As per HPI- otherwise negative.   Physical Examination: Vitals:   12/18/19 0815  BP: 122/80  Pulse: 72  Resp: 16  SpO2: 95%   Vitals:   12/18/19 0815  Weight: 190 lb (86.2 kg)  Height: 6' 0.5" (1.842 m)   Body mass index is 25.41 kg/m. Ideal Body Weight: Weight in (lb) to have BMI = 25: 186.5  GEN: no acute distress.  Normal weight, looks well HEENT: Atraumatic, Normocephalic.   Wears hearing aids, Bilateral TM wnl, oropharynx normal.  PEERL,EOMI.   Ears and Nose: No external deformity. CV: RRR, No M/G/R. No JVD. No thrill. No extra heart sounds. PULM: CTA B, no wheezes, crackles, rhonchi. No retractions. No resp. distress. No accessory muscle use. ABD: S, NT, ND, +BS. No rebound. No HSM. EXTR: No c/c/e PSYCH: Normally interactive. Conversant.  Normal strength, sensation, DTR of all limbs.  Normal  Romberg Dix-Hallpike testing is negative He has mild tenderness and a small contusion over the proximal right second metatarsal bone   Assessment and Plan: GAD (generalized anxiety disorder)  Essential hypertension  Osteoarthritis of lumbar spine, unspecified spinal osteoarthritis complication status - Plan: DRUG MONITORING, PANEL 8 WITH CONFIRMATION, URINE, HYDROcodone-acetaminophen (NORCO/VICODIN) 5-325 MG tablet  Chronic bilateral low back pain, unspecified whether sciatica present - Plan: DRUG MONITORING, PANEL 8 WITH CONFIRMATION, URINE  Epigastric pain - Plan: sucralfate (CARAFATE) 1 g tablet  Primary insomnia - Plan: traZODone (DESYREL) 150 MG tablet  Sciatica, unspecified laterality - Plan: HYDROcodone-acetaminophen (NORCO/VICODIN) 5-325 MG tablet  Screening for hyperlipidemia - Plan: Lipid panel  Screening for prostate cancer  Contusion of right foot, initial encounter - Plan: DG Foot Complete Right  Screening for lung cancer - Plan: CT CHEST LUNG CA SCREEN LOW DOSE W/O CM  Increased prostate specific antigen (PSA) velocity - Plan: PSA  Smoking greater than 30 pack years - Plan: CT CHEST LUNG CA SCREEN LOW DOSE W/O CM  Needs flu shot - Plan: Flu Vaccine QUAD High Dose(Fluad)  Patient today for follow-up visit Labs are pending as above Blood pressure under good control; he has gotten a DOT exam recently.  Notes that his blood pressure was high at time of exam, this is thought due to nerves Ordered CT lung cancer screening X-ray of foot pending- Will plan further follow- up pending results and labs This visit occurred during the SARS-CoV-2 public health emergency.  Safety protocols were in place, including screening questions prior to the visit, additional usage of staff PPE, and extensive cleaning of exam room while observing appropriate contact time as indicated for disinfecting solutions.    Signed Lamar Blinks, MD  Addendum 10/7, received his labs as  below Results for orders placed or performed in visit on 12/18/19  Lipid panel  Result Value Ref Range   Cholesterol 175 <200 mg/dL   HDL 49 > OR = 40 mg/dL   Triglycerides 157 (H) <150 mg/dL   LDL Cholesterol (Calc) 101 (H) mg/dL (calc)   Total CHOL/HDL Ratio 3.6 <5.0 (calc)   Non-HDL Cholesterol (Calc) 126 <130 mg/dL (calc)  PSA  Result Value Ref Range   PSA 0.35 < OR = 4.0 ng/mL   Lab Results  Component Value Date   PSA 0.35 12/18/2019   PSA 0.47  11/28/2018   PSA 1.01 10/09/2017    The 10-year ASCVD risk score Mikey Bussing DC Jr., et al., 2013) is: 14.7%   Values used to calculate the score:     Age: 77 years     Sex: Male     Is Non-Hispanic African American: No     Diabetic: No     Tobacco smoker: No     Systolic Blood Pressure: 706 mmHg     Is BP treated: Yes     HDL Cholesterol: 49 mg/dL     Total Cholesterol: 175 mg/dL

## 2019-12-18 ENCOUNTER — Encounter: Payer: Self-pay | Admitting: Family Medicine

## 2019-12-18 ENCOUNTER — Ambulatory Visit (INDEPENDENT_AMBULATORY_CARE_PROVIDER_SITE_OTHER): Payer: Medicare Other | Admitting: Family Medicine

## 2019-12-18 ENCOUNTER — Ambulatory Visit (HOSPITAL_BASED_OUTPATIENT_CLINIC_OR_DEPARTMENT_OTHER)
Admission: RE | Admit: 2019-12-18 | Discharge: 2019-12-18 | Disposition: A | Discharge status: Home / Self Care | Payer: Medicare Other | Source: Ambulatory Visit | Attending: Family Medicine | Admitting: Family Medicine

## 2019-12-18 ENCOUNTER — Other Ambulatory Visit: Payer: Self-pay

## 2019-12-18 VITALS — BP 122/80 | HR 72 | Resp 16 | Ht 72.5 in | Wt 190.0 lb

## 2019-12-18 DIAGNOSIS — M545 Low back pain, unspecified: Secondary | ICD-10-CM

## 2019-12-18 DIAGNOSIS — I10 Essential (primary) hypertension: Secondary | ICD-10-CM | POA: Diagnosis not present

## 2019-12-18 DIAGNOSIS — Z23 Encounter for immunization: Secondary | ICD-10-CM

## 2019-12-18 DIAGNOSIS — F411 Generalized anxiety disorder: Secondary | ICD-10-CM | POA: Diagnosis not present

## 2019-12-18 DIAGNOSIS — M47816 Spondylosis without myelopathy or radiculopathy, lumbar region: Secondary | ICD-10-CM

## 2019-12-18 DIAGNOSIS — R972 Elevated prostate specific antigen [PSA]: Secondary | ICD-10-CM

## 2019-12-18 DIAGNOSIS — R1013 Epigastric pain: Secondary | ICD-10-CM

## 2019-12-18 DIAGNOSIS — S9031XA Contusion of right foot, initial encounter: Secondary | ICD-10-CM | POA: Diagnosis present

## 2019-12-18 DIAGNOSIS — Z13220 Encounter for screening for lipoid disorders: Secondary | ICD-10-CM

## 2019-12-18 DIAGNOSIS — Z125 Encounter for screening for malignant neoplasm of prostate: Secondary | ICD-10-CM

## 2019-12-18 DIAGNOSIS — Z122 Encounter for screening for malignant neoplasm of respiratory organs: Secondary | ICD-10-CM

## 2019-12-18 DIAGNOSIS — F5101 Primary insomnia: Secondary | ICD-10-CM

## 2019-12-18 DIAGNOSIS — F17210 Nicotine dependence, cigarettes, uncomplicated: Secondary | ICD-10-CM

## 2019-12-18 DIAGNOSIS — G8929 Other chronic pain: Secondary | ICD-10-CM

## 2019-12-18 DIAGNOSIS — M5430 Sciatica, unspecified side: Secondary | ICD-10-CM

## 2019-12-18 MED ORDER — SUCRALFATE 1 G PO TABS: 1.0000 g | ORAL_TABLET | Freq: Three times a day (TID) | ORAL | 5 refills | Status: DC

## 2019-12-18 MED ORDER — HYDROCODONE-ACETAMINOPHEN 5-325 MG PO TABS: 1.0000 | ORAL_TABLET | Freq: Three times a day (TID) | ORAL | 0 refills | Status: DC | PRN

## 2019-12-18 MED ORDER — TRAZODONE HCL 150 MG PO TABS: ORAL_TABLET | ORAL | 3 refills | Status: DC

## 2019-12-19 ENCOUNTER — Ambulatory Visit (HOSPITAL_BASED_OUTPATIENT_CLINIC_OR_DEPARTMENT_OTHER)
Admission: RE | Admit: 2019-12-19 | Discharge: 2019-12-19 | Disposition: A | Discharge status: Home / Self Care | Payer: Medicare Other | Source: Ambulatory Visit | Attending: Family Medicine | Admitting: Family Medicine

## 2019-12-19 ENCOUNTER — Encounter: Payer: Self-pay | Admitting: Family Medicine

## 2019-12-19 DIAGNOSIS — Z122 Encounter for screening for malignant neoplasm of respiratory organs: Secondary | ICD-10-CM | POA: Diagnosis present

## 2019-12-19 DIAGNOSIS — F17210 Nicotine dependence, cigarettes, uncomplicated: Secondary | ICD-10-CM | POA: Insufficient documentation

## 2019-12-19 LAB — LIPID PANEL
Cholesterol: 175 mg/dL (ref <200)
HDL: 49 mg/dL (ref >=40)
LDL Cholesterol (Calc): 101 mg/dL (calc) — ABNORMAL HIGH
Non-HDL Cholesterol (Calc): 126 mg/dL (calc) (ref <130)
Total CHOL/HDL Ratio: 3.6 (calc) (ref <5.0)
Triglycerides: 157 mg/dL — ABNORMAL HIGH (ref <150)

## 2019-12-19 LAB — PSA: PSA: 0.35 ng/mL (ref <=4.0)

## 2019-12-20 MED ORDER — ROSUVASTATIN CALCIUM 10 MG PO TABS: 10.0000 mg | ORAL_TABLET | Freq: Every day | ORAL | 3 refills | Status: DC

## 2019-12-21 LAB — DRUG MONITORING, PANEL 8 WITH CONFIRMATION, URINE
6 Acetylmorphine: NEGATIVE ng/mL (ref <10)
Alcohol Metabolites: NEGATIVE ng/mL
Amphetamines: NEGATIVE ng/mL (ref <500)
Benzodiazepines: NEGATIVE ng/mL (ref <100)
Buprenorphine, Urine: NEGATIVE ng/mL (ref <5)
Cocaine Metabolite: NEGATIVE ng/mL (ref <150)
Codeine: NEGATIVE ng/mL (ref <50)
Creatinine: 58.4 mg/dL
Hydrocodone: 226 ng/mL — ABNORMAL HIGH (ref <50)
Hydromorphone: NEGATIVE ng/mL (ref <50)
MDMA: NEGATIVE ng/mL (ref <500)
Marijuana Metabolite: NEGATIVE ng/mL (ref <20)
Morphine: NEGATIVE ng/mL (ref <50)
Norhydrocodone: 91 ng/mL — ABNORMAL HIGH (ref <50)
Opiates: POSITIVE ng/mL — AB (ref <100)
Oxidant: NEGATIVE ug/mL
Oxycodone: NEGATIVE ng/mL (ref <100)
pH: 5.3 (ref 4.5–9.0)

## 2019-12-21 LAB — DM TEMPLATE

## 2020-03-20 ENCOUNTER — Encounter: Payer: Self-pay | Admitting: Family Medicine

## 2020-03-20 ENCOUNTER — Other Ambulatory Visit: Payer: Self-pay

## 2020-03-20 ENCOUNTER — Other Ambulatory Visit: Payer: Self-pay | Admitting: Family Medicine

## 2020-03-20 DIAGNOSIS — F411 Generalized anxiety disorder: Secondary | ICD-10-CM

## 2020-03-20 DIAGNOSIS — G894 Chronic pain syndrome: Secondary | ICD-10-CM

## 2020-03-20 DIAGNOSIS — J45998 Other asthma: Secondary | ICD-10-CM

## 2020-03-20 MED ORDER — MONTELUKAST SODIUM 10 MG PO TABS: 10.0000 mg | ORAL_TABLET | Freq: Every day | ORAL | 3 refills | Status: DC

## 2020-03-20 MED ORDER — CLONAZEPAM 0.5 MG PO TABS: 0.5000 mg | ORAL_TABLET | Freq: Every day | ORAL | 1 refills | Status: DC

## 2020-03-20 MED ORDER — PREGABALIN 100 MG PO CAPS: 200.0000 mg | ORAL_CAPSULE | Freq: Every day | ORAL | 3 refills | Status: DC

## 2020-03-20 NOTE — Telephone Encounter (Signed)
Requesting: Lyrica, Clonazepam Contract: 07/20/2016 UDS: 11/28/2018 Last Visit: 12/18/2019 Next Visit: None Last Refill: Lyrica: 05/27/2019, Clonazepam: 10/07/2019

## 2020-03-22 ENCOUNTER — Encounter: Payer: Self-pay | Admitting: Family Medicine

## 2020-03-23 ENCOUNTER — Other Ambulatory Visit: Payer: Self-pay

## 2020-03-23 ENCOUNTER — Ambulatory Visit (INDEPENDENT_AMBULATORY_CARE_PROVIDER_SITE_OTHER): Payer: Medicare Other | Admitting: Family Medicine

## 2020-03-23 ENCOUNTER — Ambulatory Visit (INDEPENDENT_AMBULATORY_CARE_PROVIDER_SITE_OTHER): Payer: Medicare Other

## 2020-03-23 ENCOUNTER — Encounter: Payer: Self-pay | Admitting: Family Medicine

## 2020-03-23 VITALS — BP 136/82 | HR 76 | Resp 15 | Ht 72.5 in | Wt 190.0 lb

## 2020-03-23 DIAGNOSIS — J320 Chronic maxillary sinusitis: Secondary | ICD-10-CM | POA: Diagnosis not present

## 2020-03-23 DIAGNOSIS — D696 Thrombocytopenia, unspecified: Secondary | ICD-10-CM

## 2020-03-23 DIAGNOSIS — G4453 Primary thunderclap headache: Secondary | ICD-10-CM

## 2020-03-23 LAB — BASIC METABOLIC PANEL
BUN: 15 mg/dL (ref 6–23)
CO2: 34 mEq/L — ABNORMAL HIGH (ref 19–32)
Calcium: 9.4 mg/dL (ref 8.4–10.5)
Chloride: 105 mEq/L (ref 96–112)
Creatinine, Ser: 1.04 mg/dL (ref 0.40–1.50)
GFR: 74.34 mL/min (ref >60.00)
Glucose, Bld: 81 mg/dL (ref 70–99)
Potassium: 4.3 mEq/L (ref 3.5–5.1)
Sodium: 141 mEq/L (ref 135–145)

## 2020-03-23 LAB — CBC
HCT: 46.8 % (ref 39.0–52.0)
Hemoglobin: 15.5 g/dL (ref 13.0–17.0)
MCHC: 33.1 g/dL (ref 30.0–36.0)
MCV: 94.4 fl (ref 78.0–100.0)
Platelets: 125 10*3/uL — ABNORMAL LOW (ref 150.0–400.0)
RBC: 4.96 Mil/uL (ref 4.22–5.81)
RDW: 13.6 % (ref 11.5–15.5)
WBC: 3.4 10*3/uL — ABNORMAL LOW (ref 4.0–10.5)

## 2020-03-23 MED ORDER — IOHEXOL 350 MG/ML SOLN
100.0000 mL | Freq: Once | INTRAVENOUS | Status: AC | PRN
  Administered 2020-03-23: 100 mL via INTRAVENOUS

## 2020-03-23 MED ORDER — AMOXICILLIN 500 MG PO CAPS: 1000.0000 mg | ORAL_CAPSULE | Freq: Two times a day (BID) | ORAL | 0 refills | Status: DC

## 2020-03-23 NOTE — Patient Instructions (Signed)
It was good to see you today, I am sorry you had this strange headache.  I agree that further evaluation is needed.  After talking with radiology, we will plan to do a CT angiogram of your head; this will enable [place] to evaluate for any aneurysm or source of bleeding.  I have ordered this stat so hopefully we can get it done today or tomorrow.  We will also draw a basic metabolic profile today to make sure your kidneys are functioning well for contrast  Please let me know if anything else unusual happens.  If he were to have another headache of this sort before we complete your CT I would recommend being seen at the emergency room

## 2020-03-23 NOTE — Progress Notes (Signed)
[facility] [place], [place], [place] [address] [phone]  Date:  03/23/2020   Name:  Patrick [name]   DOB:  1951-02-09   MRN:  [id number]  PCP:  Darreld Mclean, MD    Chief Complaint: Headache (Migraine after intercourse last weekend, left side above brow dull pain, throbbing/)   History of Present Illness:  Patrick [name] is a 70 y.o. very pleasant male patient who presents with the following:  Patient with history of sleep apnea, anxiety, chronic back pain treated with Lyrica, hydrocodone, fatigue, GERD  Last seen by myself in October, at that time he had a recent episode of apparent vertigo  He contacted me recently with concern of unusual headache:  I had a really bad headache after sex last week end.  It finally got some better but I still have a mild migraine over my left eye.   A couple weeks back I also had some blurred vision but that went away after an hour or so.   I was wondering if you can have an mri done or do I need to go to a neurologist.  Thanks    COVID booster- done   He developed a severe HA after intercourse about 10 days ago.  Thunderclap type headache-this severe headache lasted for maybe 5 minutes. He is not sure of distribution No nausea or vomiting No change in light or sound sensitivity  He continues to note a pain over his left eye since that time-this is much better, but he still has a headache flare He tried his Fioricet but it did not really help   He had an epiosode of blurry vision in both his eyes also recently This occurred perhaps 2 weeks ago, lasted 45- 60 minutes This has occurred in the past as well  He had a similar headache perhaps 2 years ago No numbness, weakness, or slurred speech  Patient Active Problem List   Diagnosis Date Noted  . Chronic cough 02/09/2019  . Hearing loss 12/15/2016  . Chronic fatigue 10/09/2015  . Generalized anxiety  disorder 10/09/2015  . Memory loss 10/09/2015  . Obstructive sleep apnea of adult 02/09/2015  . Benign prostatic hyperplasia with urinary obstruction 11/18/2013  . Acid reflux 11/18/2013  . Degenerative arthritis of lumbar spine 11/18/2013  . Headache, migraine 11/18/2013  . External hemorrhoids with other complication [id number]  . Restless leg 05/27/2009    Past Medical History:  Diagnosis Date  . Anxiety   . Arthritis   . Basal cell carcinoma   . BPH (benign prostatic hyperplasia)   . Depression   . Dysphagia   . Emphysema of lung ([place])   . Fibromyalgia   . GERD (gastroesophageal reflux disease)    otc  tums  . Headache    migraines  . Herpes simplex type 1 infection   . History of migraine   . Hypertension    no longer on medication  . Insomnia   . Lower back pain   . Morton's neuroma   . OSA (obstructive sleep apnea)   . Primary snoring   . Recurrent canker sores   . Urinary frequency     Past Surgical History:  Procedure Laterality Date  . EPIDIDYMECTOMY     for spermatocele  . EYE SURGERY Bilateral    lasik eye surgery  . FOOT SURGERY Left   . INGUINAL HERNIA REPAIR Left    01/25/1999  .  MOUTH SURGERY    . pancreatic surgery r/t trauma    . SEPTOPLASTY N/A 02/09/2015   Procedure: SEPTOPLASTY;  Surgeon: Rozetta Nunnery, MD;  Location: [facility];  Service: ENT;  Laterality: N/A;  . SKIN CANCER EXCISION    . TONSILLECTOMY    . TRANSURETHRAL INCISION OF PROSTATE    . TURBINATE REDUCTION Bilateral 02/09/2015   Procedure: BILATERAL TURBINATE REDUCTION;  Surgeon: Rozetta Nunnery, MD;  Location: [facility];  Service: ENT;  Laterality: Bilateral;  . UVULOPALATOPHARYNGOPLASTY N/A 02/09/2015   Procedure: UVULOPALATOPHARYNGOPLASTY (UPPP);  Surgeon: Rozetta Nunnery, MD;  Location: [facility];  Service: ENT;  Laterality: N/A;    Social History   Tobacco Use  . Smoking status: Former Smoker    Packs/day: 2.00    Years: 30.00    Pack years: 60.00    Types:  Cigarettes    Quit date: 10/29/2005    Years since quitting: 14.4  . Smokeless tobacco: Never Used  Substance Use Topics  . Alcohol use: Yes    Alcohol/week: 1.0 standard drink    Types: 1 Cans of beer per week    Comment: 1 beer every other day  . Drug use: No    Family History  Problem Relation Age of Onset  . Coronary artery disease Mother   . Stroke Mother   . Hypertension Father   . Anxiety disorder Brother   . Skin cancer Brother   . Stroke Brother     No Known Allergies  Medication list has been reviewed and updated.  Current Outpatient Medications on File Prior to Visit  Medication Sig Dispense Refill  . albuterol (VENTOLIN HFA) 108 (90 Base) MCG/ACT inhaler Inhale 2 puffs into the lungs every 6 (six) hours as needed for wheezing or shortness of breath. 18 g 2  . betamethasone dipropionate (DIPROLENE) 0.05 % cream Apply topically 2 (two) times daily. Use as needed 30 g 3  . Butalbital-APAP-Caff-Cod 50-300-40-30 MG CAPS Take 1-2 caps every 6 hours as needed for headache.  Max 6/24 hours 40 capsule 1  . Cholecalciferol (VITAMIN D3) 2000 UNITS TABS Take 2,000 Units by mouth daily.    . clonazePAM (KLONOPIN) 0.5 MG tablet Take 1-2 tablets (0.5-1 mg total) by mouth at bedtime. As needed for sleep 180 tablet 1  . famotidine (PEPCID) 20 MG tablet Take once or twice a day as needed to control GERD 180 tablet 3  . fluticasone (FLONASE) 50 MCG/ACT nasal spray Place 2 sprays into both nostrils daily. 43 g 3  . fluticasone furoate-vilanterol (BREO ELLIPTA) 200-25 MCG/INH AEPB Inhale 1 puff into the lungs daily. 1 each 0  . HYDROcodone-acetaminophen (NORCO/VICODIN) 5-325 MG tablet Take 1 tablet by mouth every 8 (eight) hours as needed for moderate pain. 45 tablet 0  . Ibuprofen-Acetaminophen (ADVIL DUAL ACTION) 125-250 MG TABS Take by mouth.    . losartan (COZAAR) 50 MG tablet Take 1 tablet (50 mg total) by mouth daily. 90 tablet 3  . Melatonin 5 MG TABS Take 5 mg by mouth at  bedtime.    . methylphenidate (RITALIN) 20 MG tablet Take 1 tablet (20 mg total) by mouth 2 (two) times daily. 180 tablet 0  . montelukast (SINGULAIR) 10 MG tablet Take 1 tablet (10 mg total) by mouth at bedtime. 90 tablet 3  . pantoprazole (PROTONIX) 40 MG tablet Take 1 tablet (40 mg total) by mouth 2 (two) times daily. Twice daily for 2 weeks, then can take daily for total of 8 weeks  60 tablet 3  . pregabalin (LYRICA) 100 MG capsule Take 2 capsules (200 mg total) by mouth at bedtime. 180 capsule 3  . propranolol (INDERAL) 10 MG tablet TAKE 1 TABLET EVERY 12 HOURS AS NEEDED FOR TREMOR 180 tablet 3  . rosuvastatin (CRESTOR) 10 MG tablet Take 1 tablet (10 mg total) by mouth daily. 90 tablet 3  . sucralfate (CARAFATE) 1 g tablet Take 1 tablet (1 g total) by mouth 4 (four) times daily -  with meals and at bedtime. 40 tablet 0  . sucralfate (CARAFATE) 1 g tablet Take 1 tablet (1 g total) by mouth 4 (four) times daily -  with meals and at bedtime. Use as needed 120 tablet 5  . tolterodine (DETROL LA) 4 MG 24 hr capsule Take 1 capsule (4 mg total) by mouth daily. 90 capsule 3  . traZODone (DESYREL) 150 MG tablet Take 150 mg at bedtime as needed for sleep 90 tablet 3  . vitamin B-12 (CYANOCOBALAMIN) 500 MCG tablet Take 500 mcg by mouth daily.     No current facility-administered medications on file prior to visit.    Review of Systems:  As per HPI- otherwise negative.   Physical Examination: Vitals:   03/23/20 1102  BP: 136/82  Pulse: 76  Resp: 15  SpO2: 99%   Vitals:   03/23/20 1102  Weight: 190 lb (86.2 kg)  Height: 6' 0.5" (1.842 m)   Body mass index is 25.41 kg/m. Ideal Body Weight: Weight in (lb) to have BMI = 25: 186.5  GEN: no acute distress. normal weight, looks well  HEENT: Atraumatic, Normocephalic.  Bilateral TM wnl, oropharynx normal.  PEERL,EOMI.   Hearing aids bilaterally removed for exam  Ears and Nose: No external deformity. CV: RRR, No M/G/R. No JVD. No thrill. No  extra heart sounds. PULM: CTA B, no wheezes, crackles, rhonchi. No retractions. No resp. distress. No accessory muscle use. ABD: S, NT, ND, +BS. No rebound. No HSM. EXTR: No c/c/e PSYCH: Normally interactive. Conversant.  Normal strength, sensation, and DTR of all extremities  Normal facial motion, normal facial sensation Negative gait, negative Romberg  Assessment and Plan: Thunderclap headache - Plan: Basic metabolic panel, CBC, CT ANGIO HEAD W OR WO CONTRAST, CANCELED: CT ANGIO HEAD W OR WO CONTRAST   Patient is here today with a thunderclap/orgasm headache, concerning for possible bleed or aneurysm.  Discussed imaging options with neurology, they recommended a CT angio head with and without contrast.  I discussed this recommendation with the patient, he would like to go ahead with imaging as soon as possible.  We will obtain BMP as required prior to contrast administration and hopefully get imaging done today  This visit occurred during the SARS-CoV-2 public health emergency.  Safety protocols were in place, including screening questions prior to the visit, additional usage of staff PPE, and extensive cleaning of exam room while observing appropriate contact time as indicated for disinfecting solutions.    Signed Ammiel Guiney, MD  Received his labs as well as imaging reports as below-contacted patient  CT ANGIO HEAD W OR WO CONTRAST  Result Date: 03/23/2020 CLINICAL DATA:  Acute neuro deficit. Post coital headache 10 days ago. EXAM: CT ANGIOGRAPHY HEAD TECHNIQUE: Multidetector CT imaging of the head was performed using the standard protocol during bolus administration of intravenous contrast. Multiplanar CT image reconstructions and MIPs were obtained to evaluate the vascular anatomy. CONTRAST:  <MEASUREMEN295([place] 295([place]M 295([place]M<MEASUREMEN295([place] 295([place]M 295([place]M<MEASUREMEN295 295([place]M 295([place]M 295([place]M 295([place] 295([place]M(36Maxwell CaulxOHEXOL 350 MG/ML SOLN COMPARISON:  CT head 10/20/2014.  MRI head 03/25/2006  FINDINGS: CT HEAD Brain: No evidence of acute infarction, hemorrhage, hydrocephalus, extra-axial  collection or mass lesion/mass effect. Vascular: Negative for hyperdense vessel Skull: Negative Sinuses: Mild mucosal edema left maxillary sinus. Remaining sinuses clear. Orbits: Negative CTA HEAD Anterior circulation: No significant stenosis, proximal occlusion, aneurysm, or vascular malformation. Posterior circulation: No significant stenosis, proximal occlusion, aneurysm, or vascular malformation. Venous sinuses: Normal venous enhancement. No venous sinus thrombosis. Left transverse sinus dominant. Anatomic variants: None IMPRESSION: Negative CT head. Negative CT angio head.  No vascular malformation. Electronically Signed   By: Franchot Gallo M.D.   On: 03/23/2020 14:35   Results for orders placed or performed in visit on 24/82/50  Basic metabolic panel  Result Value Ref Range   Sodium 141 135 - 145 mEq/L   Potassium 4.3 3.5 - 5.1 mEq/L   Chloride 105 96 - 112 mEq/L   CO2 34 (H) 19 - 32 mEq/L   Glucose, Bld 81 70 - 99 mg/dL   BUN 15 6 - 23 mg/dL   Creatinine, Ser 1.04 0.40 - 1.50 mg/dL   GFR 74.34 >60.00 mL/min   Calcium 9.4 8.4 - 10.5 mg/dL  CBC  Result Value Ref Range   WBC 3.4 (L) 4.0 - 10.5 K/uL   RBC 4.96 4.22 - 5.81 Mil/uL   Platelets 125.0 (L) 150.0 - 400.0 K/uL   Hemoglobin 15.5 13.0 - 17.0 g/dL   HCT 46.8 39.0 - 52.0 %   MCV 94.4 78.0 - 100.0 fl   MCHC 33.1 30.0 - 36.0 g/dL   RDW 13.6 11.5 - 15.5 %

## 2020-03-24 ENCOUNTER — Encounter: Payer: Self-pay | Admitting: Neurology

## 2020-04-22 NOTE — Progress Notes (Signed)
NEUROLOGY CONSULTATION NOTE  Patrick [name] MRN: [id number] DOB: April 12, 1950  Referring provider: Lamar Blinks, MD Primary care provider: Lamar Blinks, MD  Reason for consult:  Thunderclap headache   Subjective:  Patrick [name] is a 70 year old right-handed male with chronic back pain, sleep apnea and anxiety who presents for thunderclap headache.  History supplemented by referring provider's note.  He reports a couple of episodes of severe headache associated with sexual activity.  It first occurred a couple of years ago.  It occurred again a couple of months ago.  He describes sudden onset head exploding, with ejaculation.  It lasted 10-15 minutes followed by dull headache for a while.  CTA of head on 03/23/2020 personally reviewed was normal with no evidence of intracranial mass lesion, stroke, bleed, aneurysm or other vascular abnormality.  He reports history of migraines for last several years.  They are typically severe bifrontal throbbing headache with nausea, photophobia and phonophobia, usually occurs once a month to once every 2 to 3 months, responds within an hour to Fioricet with codeine.  For the past month, he reports a new headache.  It is a severe non-throbbing left frontotemporal headache.  It lasts 5-6 hours and have been occurring once a week.  They are not associated with other symptoms such as nausea, vomiting, photophobia, phonophobia, visual disturbance, speech disturbance, autonomic symptoms, numbness and weakness.  Standing up may make it worse.  Rubbing his forehead/temple helps relieve it.  It does not respond to the Fioricet, Tylenol, Advil, Excedrin.Marland Kitchen      He occasionally gets dizzy from time to time, spinning sensation upon standing.    Current NSAIDS/analgesics:  Fioricet with codeine, Vicodin, ibuprofen-acetaminophen Current triptans:  none Current ergotamine:  none Current anti-emetic:  none Current muscle relaxants:  none Current  Antihypertensive medications: propranolol 10mg  PRN (tremor) Current Antidepressant medications:  Trazodone 150mg  QHS PRN Current Anticonvulsant medications:  Lyrica 200mg  QHS Current anti-CGRP:  none Current Vitamins/Herbal/Supplements: B12, melatonin Current Antihistamines/Decongestants:  Flonase Other therapy:  none Hormone/birth control:  none Other medications:  Ritalin  Past NSAIDS/analgesics:  Tylenol, Advil, Aleve, Excedrin Past abortive triptans:  none  Past abortive ergotamine:  none Past muscle relaxants:  none Past anti-emetic:  none Past antihypertensive medications:  none Past antidepressant medications:  Sertraline Past anticonvulsant medications:  none Past anti-CGRP:  none Past vitamins/Herbal/Supplements:  none Past antihistamines/decongestants:  none Other past therapies:  none  Caffeine:  1 yeti size of coffee daily Diet:  Tries to hydrate Exercise:  Walks a couple of miles daily Depression:  no; Anxiety:  no Other pain: generalized aches, arthritis, fibromyalgia, shoulder, back  Sleep:  Needs trazodone, melatonin, clonazepam Family history of headache:  Both parents, mother had a stroke      PAST MEDICAL HISTORY: Past Medical History:  Diagnosis Date  . Anxiety   . Arthritis   . Basal cell carcinoma   . BPH (benign prostatic hyperplasia)   . Depression   . Dysphagia   . Emphysema of lung ([place])   . Fibromyalgia   . GERD (gastroesophageal reflux disease)    otc  tums  . Headache    migraines  . Herpes simplex type 1 infection   . History of migraine   . Hypertension    no longer on medication  . Insomnia   . Lower back pain   . Morton's neuroma   . OSA (obstructive sleep apnea)   . Primary snoring   . Recurrent canker sores   .  Urinary frequency     PAST SURGICAL HISTORY: Past Surgical History:  Procedure Laterality Date  . EPIDIDYMECTOMY     for spermatocele  . EYE SURGERY Bilateral    lasik eye surgery  . FOOT SURGERY Left   .  INGUINAL HERNIA REPAIR Left    01/25/1999  . MOUTH SURGERY    . pancreatic surgery r/t trauma    . SEPTOPLASTY N/A 02/09/2015   Procedure: SEPTOPLASTY;  Surgeon: Rozetta Nunnery, MD;  Location: [facility];  Service: ENT;  Laterality: N/A;  . SKIN CANCER EXCISION    . TONSILLECTOMY    . TRANSURETHRAL INCISION OF PROSTATE    . TURBINATE REDUCTION Bilateral 02/09/2015   Procedure: BILATERAL TURBINATE REDUCTION;  Surgeon: Rozetta Nunnery, MD;  Location: [facility];  Service: ENT;  Laterality: Bilateral;  . UVULOPALATOPHARYNGOPLASTY N/A 02/09/2015   Procedure: UVULOPALATOPHARYNGOPLASTY (UPPP);  Surgeon: Rozetta Nunnery, MD;  Location: [facility];  Service: ENT;  Laterality: N/A;    MEDICATIONS: Current Outpatient Medications on File Prior to Visit  Medication Sig Dispense Refill  . albuterol (VENTOLIN HFA) 108 (90 Base) MCG/ACT inhaler Inhale 2 puffs into the lungs every 6 (six) hours as needed for wheezing or shortness of breath. 18 g 2  . amoxicillin (AMOXIL) 500 MG capsule Take 2 capsules (1,000 mg total) by mouth 2 (two) times daily. 40 capsule 0  . betamethasone dipropionate (DIPROLENE) 0.05 % cream Apply topically 2 (two) times daily. Use as needed 30 g 3  . Butalbital-APAP-Caff-Cod 50-300-40-30 MG CAPS Take 1-2 caps every 6 hours as needed for headache.  Max 6/24 hours 40 capsule 1  . Cholecalciferol (VITAMIN D3) 2000 UNITS TABS Take 2,000 Units by mouth daily.    . clonazePAM (KLONOPIN) 0.5 MG tablet Take 1-2 tablets (0.5-1 mg total) by mouth at bedtime. As needed for sleep 180 tablet 1  . famotidine (PEPCID) 20 MG tablet Take once or twice a day as needed to control GERD 180 tablet 3  . fluticasone (FLONASE) 50 MCG/ACT nasal spray Place 2 sprays into both nostrils daily. 43 g 3  . fluticasone furoate-vilanterol (BREO ELLIPTA) 200-25 MCG/INH AEPB Inhale 1 puff into the lungs daily. 1 each 0  . HYDROcodone-acetaminophen (NORCO/VICODIN) 5-325 MG tablet Take 1 tablet by mouth every 8  (eight) hours as needed for moderate pain. 45 tablet 0  . Ibuprofen-Acetaminophen (ADVIL DUAL ACTION) 125-250 MG TABS Take by mouth.    . losartan (COZAAR) 50 MG tablet Take 1 tablet (50 mg total) by mouth daily. 90 tablet 3  . Melatonin 5 MG TABS Take 5 mg by mouth at bedtime.    . methylphenidate (RITALIN) 20 MG tablet Take 1 tablet (20 mg total) by mouth 2 (two) times daily. 180 tablet 0  . montelukast (SINGULAIR) 10 MG tablet Take 1 tablet (10 mg total) by mouth at bedtime. 90 tablet 3  . pantoprazole (PROTONIX) 40 MG tablet Take 1 tablet (40 mg total) by mouth 2 (two) times daily. Twice daily for 2 weeks, then can take daily for total of 8 weeks 60 tablet 3  . pregabalin (LYRICA) 100 MG capsule Take 2 capsules (200 mg total) by mouth at bedtime. 180 capsule 3  . propranolol (INDERAL) 10 MG tablet TAKE 1 TABLET EVERY 12 HOURS AS NEEDED FOR TREMOR 180 tablet 3  . rosuvastatin (CRESTOR) 10 MG tablet Take 1 tablet (10 mg total) by mouth daily. 90 tablet 3  . sucralfate (CARAFATE) 1 g tablet Take 1 tablet (1 g total)  by mouth 4 (four) times daily -  with meals and at bedtime. 40 tablet 0  . sucralfate (CARAFATE) 1 g tablet Take 1 tablet (1 g total) by mouth 4 (four) times daily -  with meals and at bedtime. Use as needed 120 tablet 5  . tolterodine (DETROL LA) 4 MG 24 hr capsule Take 1 capsule (4 mg total) by mouth daily. 90 capsule 3  . traZODone (DESYREL) 150 MG tablet Take 150 mg at bedtime as needed for sleep 90 tablet 3  . vitamin B-12 (CYANOCOBALAMIN) 500 MCG tablet Take 500 mcg by mouth daily.     No current facility-administered medications on file prior to visit.    ALLERGIES: No Known Allergies  FAMILY HISTORY: Family History  Problem Relation Age of Onset  . Coronary artery disease Mother   . Stroke Mother   . Hypertension Father   . Anxiety disorder Brother   . Skin cancer Brother   . Stroke Brother     SOCIAL HISTORY: Social History   Socioeconomic History  . Marital  status: Married    Spouse name: Not on file  . Number of children: 2  . Years of education: BS   . Highest education level: Not on file  Occupational History  . Occupation: Chief Financial Officer  Tobacco Use  . Smoking status: Former Smoker    Packs/day: 2.00    Years: 30.00    Pack years: 60.00    Types: Cigarettes    Quit date: 10/29/2005    Years since quitting: 14.4  . Smokeless tobacco: Never Used  Substance and Sexual Activity  . Alcohol use: Yes    Alcohol/week: 1.0 standard drink    Types: 1 Cans of beer per week    Comment: 1 beer every other day  . Drug use: No  . Sexual activity: Not on file  Other Topics Concern  . Not on file  Social History Narrative   Denies caffeine use    Social Determinants of Health   Financial Resource Strain: Not on file  Food Insecurity: Not on file  Transportation Needs: Not on file  Physical Activity: Not on file  Stress: Not on file  Social Connections: Not on file  Intimate Partner Violence: Not on file    Objective:  Blood pressure 126/81, pulse 84, height 6' (1.829 m), weight 189 lb (85.7 kg), SpO2 96 %. General: No acute distress.  Patient appears well-groomed.   Head:  Normocephalic/atraumatic Eyes:  fundi examined but not visualized Neck: supple, no paraspinal tenderness, full range of motion Back: No paraspinal tenderness Heart: regular rate and rhythm Lungs: Clear to auscultation bilaterally. Vascular: No carotid bruits. Neurological Exam: Mental status: alert and oriented to person, place, and time, recent and remote memory intact, fund of knowledge intact, attention and concentration intact, speech fluent and not dysarthric, language intact. Cranial nerves: CN I: not tested CN II: pupils equal, round and reactive to light, visual fields intact CN III, IV, VI:  full range of motion, no nystagmus, no ptosis CN V: facial sensation intact. CN VII: upper and lower face symmetric CN VIII: hearing intact CN IX, X: gag intact,  uvula midline CN XI: sternocleidomastoid and trapezius muscles intact CN XII: tongue midline Bulk & Tone: normal, no fasciculations. Motor:  muscle strength 5/5 throughout Sensation:  Pinprick, temperature and vibratory sensation intact. Deep Tendon Reflexes:  2+ throughout,  toes downgoing.   Finger to nose testing:  Without dysmetria.   Heel to shin:  Without dysmetria.  Gait:  Normal station and stride.  Romberg negative.  Assessment/Plan:   1.  Primary headache associated with sexual activity 2.  Migraine without aura, without status migrainosus, not intractable.  New headaches are likely change in migraine.  CT/CTA brain unremarkable. 3.  Dizziness - vague  1.  To address frequent headache, will start topiramate 25mg  at bedtime for one week, then 50mg  at bedtime.  He may discontinue propranolol which he took as needed for tremor as tremor isn't too bad and also may be treated by topiramate 2.  For migraine rescue, stop Fioricet with Codeine.  He will try sumatriptan 100mg . 3.  If he should have another severe headache with sexual activity, he may try indomethacin 25mg  to 50mg  as needed. 4.  Limit use of pain relievers to no more than 2 days out of week to prevent risk of rebound or medication-overuse headache. 5.  Keep headache diary 6.  Follow up in 6 months.    Thank you for allowing me to take part in the care of this patient.  Metta Clines, DO  CC:  Lamar Blinks, MD

## 2020-04-23 ENCOUNTER — Encounter: Payer: Self-pay | Admitting: Neurology

## 2020-04-23 ENCOUNTER — Other Ambulatory Visit: Payer: Self-pay

## 2020-04-23 ENCOUNTER — Ambulatory Visit (INDEPENDENT_AMBULATORY_CARE_PROVIDER_SITE_OTHER): Payer: Medicare Other | Admitting: Neurology

## 2020-04-23 VITALS — BP 126/81 | HR 84 | Ht 72.0 in | Wt 189.0 lb

## 2020-04-23 DIAGNOSIS — G4482 Headache associated with sexual activity: Secondary | ICD-10-CM

## 2020-04-23 DIAGNOSIS — R42 Dizziness and giddiness: Secondary | ICD-10-CM | POA: Diagnosis not present

## 2020-04-23 DIAGNOSIS — G43009 Migraine without aura, not intractable, without status migrainosus: Secondary | ICD-10-CM | POA: Diagnosis not present

## 2020-04-23 MED ORDER — SUMATRIPTAN SUCCINATE 100 MG PO TABS: ORAL_TABLET | ORAL | 5 refills | Status: DC

## 2020-04-23 MED ORDER — TOPIRAMATE 25 MG PO TABS: ORAL_TABLET | ORAL | 0 refills | Status: DC

## 2020-04-23 MED ORDER — INDOMETHACIN 25 MG PO CAPS: ORAL_CAPSULE | ORAL | 0 refills | Status: AC

## 2020-04-23 NOTE — Patient Instructions (Signed)
1.  Start topiramate 25mg  at bedtime for one week, then increase to 50mg  at bedtime.  Stop propranolol. 2.  Stop butalbital.  Take sumatriptan 100mg  at earliest onset of migraine.  May repeat in 2 hours if needed.  Maximum 2 tablets in 24 hours. 3.  If you should get another severe headache with sexual activity, take indomethacin 25mg  1 to 2 pills as needed.  May take up to 3 times in 24 hours. 4.  Limit use of pain relievers to no more than 2 days out of week to prevent risk of rebound or medication-overuse headache. 5.  Follow up in 6 months.

## 2020-05-05 ENCOUNTER — Encounter: Payer: Self-pay | Admitting: Family Medicine

## 2020-05-05 ENCOUNTER — Telehealth: Payer: Self-pay | Admitting: Family Medicine

## 2020-05-05 NOTE — Telephone Encounter (Signed)
Form in providers folder for review and signature.

## 2020-05-05 NOTE — Telephone Encounter (Signed)
Pt dropped off document to be filled out by provider (1 page [employer] document) Pt would like document to be faxed when ready to [phone]. Document put at front office tray under providers name.

## 2020-05-12 NOTE — Patient Instructions (Incomplete)
Good to see you again today - be safe doing your tree work!    Use the wrist brace as needed for comfort At your convenience please have a writs and chest film taken I will refer you to cardiology as well- let me know if nay change or worsening in your shortness of breath symptoms in the  meantime

## 2020-05-12 NOTE — Progress Notes (Signed)
[facility] [place], [place], [place] [address] [phone]  Date:  05/14/2020   Name:  Patrick [name]   DOB:  [birth year]   MRN:  [id number]  PCP:  Darreld Mclean, MD    Chief Complaint: Wrist Pain (Right wrist ) and Cough (Has been experiencing a cough for several months, taking singulair )   History of Present Illness:  Patrick [name] is a 70 y.o. very pleasant male patient who presents with the following:  Patient with history of sleep apnea, anxiety, chronic back paintreated with Lyrica, hydrocodone, fatigue, GERD  Here today with concern of wrist pain Last seen by myself in January when he had a concerning thunderclap headache He followed up with neurology last month  Dr Tomi Likens 1.  Primary headache associated with sexual activity 2.  Migraine without aura, without status migrainosus, not intractable.  New headaches are likely change in migraine.  CT/CTA brain unremarkable. 3.  Dizziness - vague------------------------------ 1.  To address frequent headache, will start topiramate 25mg  at bedtime for one week, then 50mg  at bedtime.  He may discontinue propranolol which he took as needed for tremor as tremor isn't too bad and also may be treated by topiramate 2.  For migraine rescue, stop Fioricet with Codeine.  He will try sumatriptan 100mg . 3.  If he should have another severe headache with sexual activity, he may try indomethacin 25mg  to 50mg  as needed. 4.  Limit use of pain relievers to no more than 2 days out of week to prevent risk of rebound or medication-overuse headache. 5.  Keep headache diary 6.  Follow up in 6 months.  covid booster - done   His main concern today is right wrist pain for about 2 months.  He is right-handed NKI-he is not aware of any change in his activities which could have triggered this pain It is getting worse  No numbness or tingling in his fingers Grip  strength is a bit worse than prior  Does not swell or get red at all   He notes a good result with his Topamax, he is taking 50 mg.  His next follow-up with neurology is in August, I will go ahead and refill for him  Finally, he notes that he may cough sometimes and that physical work can feel a bit harder than it normally might.  No chest pain, but he does get winded more easily with exercise.  He is taking Singulair already for allergies.  Suggested adding Claritin or Zyrtec if he likes  We did a stress test in 2019 which was normal  Blood pressure demonstrated a normal response to exercise.  There was no ST segment deviation noted during stress.  Clinically and electrically negative for ischemia  Excellent exercise tolerance    Patient Active Problem List   Diagnosis Date Noted  . Chronic cough 02/09/2019  . Hearing loss 12/15/2016  . Chronic fatigue 10/09/2015  . Generalized anxiety disorder 10/09/2015  . Memory loss 10/09/2015  . Obstructive sleep apnea of adult 02/09/2015  . Benign prostatic hyperplasia with urinary obstruction 11/18/2013  . Acid reflux 11/18/2013  . Degenerative arthritis of lumbar spine 11/18/2013  . Headache, migraine 11/18/2013  . External hemorrhoids with other complication 09/04/7626  . Restless leg 05/27/2009    Past Medical History:  Diagnosis Date  . Anxiety   . Arthritis   . Basal cell carcinoma   . BPH (benign  prostatic hyperplasia)   . Depression   . Dysphagia   . Emphysema of lung ([facility])   . Fibromyalgia   . GERD (gastroesophageal reflux disease)    otc  tums  . Headache    migraines  . Herpes simplex type 1 infection   . History of migraine   . Hypertension    no longer on medication  . Insomnia   . Lower back pain   . Morton's neuroma   . OSA (obstructive sleep apnea)   . Primary snoring   . Recurrent canker sores   . Urinary frequency     Past Surgical History:  Procedure Laterality Date  . EPIDIDYMECTOMY     for  spermatocele  . EYE SURGERY Bilateral    lasik eye surgery  . FOOT SURGERY Left   . INGUINAL HERNIA REPAIR Left    01/25/1999  . MOUTH SURGERY    . pancreatic surgery r/t trauma    . SEPTOPLASTY N/A 02/09/2015   Procedure: SEPTOPLASTY;  Surgeon: Rozetta Nunnery, MD;  Location: [facility];  Service: ENT;  Laterality: N/A;  . SKIN CANCER EXCISION    . TONSILLECTOMY    . TRANSURETHRAL INCISION OF PROSTATE    . TURBINATE REDUCTION Bilateral 02/09/2015   Procedure: BILATERAL TURBINATE REDUCTION;  Surgeon: Rozetta Nunnery, MD;  Location: [facility];  Service: ENT;  Laterality: Bilateral;  . UVULOPALATOPHARYNGOPLASTY N/A 02/09/2015   Procedure: UVULOPALATOPHARYNGOPLASTY (UPPP);  Surgeon: Rozetta Nunnery, MD;  Location: [facility];  Service: ENT;  Laterality: N/A;    Social History   Tobacco Use  . Smoking status: Former Smoker    Packs/day: 2.00    Years: 30.00    Pack years: 60.00    Types: Cigarettes    Quit date: 10/29/2005    Years since quitting: 14.5  . Smokeless tobacco: Never Used  Substance Use Topics  . Alcohol use: Yes    Alcohol/week: 1.0 standard drink    Types: 1 Cans of beer per week    Comment: 1 beer every other day  . Drug use: No    Family History  Problem Relation Age of Onset  . Coronary artery disease Mother   . Stroke Mother   . Hypertension Father   . Anxiety disorder Brother   . Skin cancer Brother   . Stroke Brother     No Known Allergies  Medication list has been reviewed and updated.  Current Outpatient Medications on File Prior to Visit  Medication Sig Dispense Refill  . albuterol (VENTOLIN HFA) 108 (90 Base) MCG/ACT inhaler Inhale 2 puffs into the lungs every 6 (six) hours as needed for wheezing or shortness of breath. 18 g 2  . clonazePAM (KLONOPIN) 0.5 MG tablet Take 1-2 tablets (0.5-1 mg total) by mouth at bedtime. As needed for sleep 180 tablet 1  . famotidine (PEPCID) 20 MG tablet Take once or twice a day as needed to control GERD 180  tablet 3  . fluticasone (FLONASE) 50 MCG/ACT nasal spray Place 2 sprays into both nostrils daily. 43 g 3  . fluticasone furoate-vilanterol (BREO ELLIPTA) 200-25 MCG/INH AEPB Inhale 1 puff into the lungs daily. 1 each 0  . HYDROcodone-acetaminophen (NORCO/VICODIN) 5-325 MG tablet Take 1 tablet by mouth every 8 (eight) hours as needed for moderate pain. 45 tablet 0  . indomethacin (INDOCIN) 25 MG capsule Take 1 to 2 capsules three times daily as needed for headache. 20 capsule 0  . Melatonin 5 MG TABS Take 5 mg  by mouth at bedtime.    . methylphenidate (RITALIN) 20 MG tablet Take 1 tablet (20 mg total) by mouth 2 (two) times daily. 180 tablet 0  . montelukast (SINGULAIR) 10 MG tablet Take 1 tablet (10 mg total) by mouth at bedtime. 90 tablet 3  . pregabalin (LYRICA) 100 MG capsule Take 2 capsules (200 mg total) by mouth at bedtime. 180 capsule 3  . rosuvastatin (CRESTOR) 10 MG tablet Take 1 tablet (10 mg total) by mouth daily. 90 tablet 3  . sucralfate (CARAFATE) 1 g tablet Take 1 tablet (1 g total) by mouth 4 (four) times daily -  with meals and at bedtime. 40 tablet 0  . SUMAtriptan (IMITREX) 100 MG tablet Take 1 tablet earliest onset of migraine.  May repeat in 2 hours if headache persists or recurs.  Maximum 2 tablets in 24 hours. 10 tablet 5  . tolterodine (DETROL LA) 4 MG 24 hr capsule Take 1 capsule (4 mg total) by mouth daily. 90 capsule 3  . traZODone (DESYREL) 150 MG tablet Take 150 mg at bedtime as needed for sleep 90 tablet 3  . vitamin B-12 (CYANOCOBALAMIN) 500 MCG tablet Take 500 mcg by mouth daily.     No current facility-administered medications on file prior to visit.    Review of Systems:  As per HPI- otherwise negative.   Physical Examination: Vitals:   05/14/20 0902  BP: 104/88  Pulse: 80  Resp: 18  Temp: 97.7 F (36.5 C)  SpO2: 97%   Vitals:   05/14/20 0902  Weight: 191 lb (86.6 kg)  Height: 6' (1.829 m)   Body mass index is 25.9 kg/m. Ideal Body Weight:  Weight in (lb) to have BMI = 25: 183.9  GEN: no acute distress.  Normal weight, looks well HEENT: Atraumatic, Normocephalic.  Ears and Nose: No external deformity. CV: RRR, No M/G/R. No JVD. No thrill. No extra heart sounds. PULM: CTA B, no wheezes, crackles, rhonchi. No retractions. No resp. distress. No accessory muscle use. ABD: S, NT, ND, +BS. No rebound. No HSM. EXTR: No c/c/e PSYCH: Normally interactive. Conversant.  Right wrist exam shows normal range of motion, no redness or swelling or heat.  Negative Finkelstein's testing.  The patient notes pain more if he deviates his hand towards the radius.  Tenderness seems concentrated at the radius/metacarpal junction  EKG shows normal sinus rhythm, no acute concerning changes when compared with tracing from 2017, no ST elevation or depression Assessment and Plan: SOB (shortness of breath) - Plan: EKG 12-Lead, DG Chest 2 View, Ambulatory referral to Cardiology  Right wrist pain - Plan: DG Wrist Complete Right  Cough - Plan: benzonatate (TESSALON) 200 MG capsule  Thunderclap headache - Plan: topiramate (TOPAMAX) 50 MG tablet  Tim is here today with a couple of concerns.  He has recently noted that he feels more short of breath and gets winded more easily with exercise.  No chest pain, he is okay at rest.  We did not do an exercise treadmill test back in 2019, but no more advanced imaging.  EKG is normal today.  I have ordered a chest x-ray-he does plan to do this at a later date as he is about to leave town.  We will also refer to cardiology for any further imaging may be indicated He is advised to take it somewhat easy, avoid maximal physical exertion until evaluation is done.  He is to seek care if getting worse  He has used [facility] in  the past for persistent cough, would like a refill  Thunderclap Headache has been assessed by neurology, no concerning findings.  He is using Topamax with success, refilled  Right wrist pain of  uncertain etiology.  Does not seem to be anything dangerous.  I have ordered wrist films, again he plans to do this at a later date.  Provided a Velcro wrist splint to use as needed for support in the meantime  This visit occurred during the SARS-CoV-2 public health emergency.  Safety protocols were in place, including screening questions prior to the visit, additional usage of staff PPE, and extensive cleaning of exam room while observing appropriate contact time as indicated for disinfecting solutions.    Signed Lamar Blinks, MD

## 2020-05-14 ENCOUNTER — Ambulatory Visit (INDEPENDENT_AMBULATORY_CARE_PROVIDER_SITE_OTHER): Payer: Medicare Other | Admitting: Family Medicine

## 2020-05-14 ENCOUNTER — Encounter: Payer: Self-pay | Admitting: Family Medicine

## 2020-05-14 ENCOUNTER — Other Ambulatory Visit: Payer: Self-pay

## 2020-05-14 VITALS — BP 104/88 | HR 80 | Temp 97.7°F | Resp 18 | Ht 72.0 in | Wt 191.0 lb

## 2020-05-14 DIAGNOSIS — R059 Cough, unspecified: Secondary | ICD-10-CM

## 2020-05-14 DIAGNOSIS — R0602 Shortness of breath: Secondary | ICD-10-CM | POA: Diagnosis not present

## 2020-05-14 DIAGNOSIS — M25531 Pain in right wrist: Secondary | ICD-10-CM | POA: Diagnosis not present

## 2020-05-14 DIAGNOSIS — G4453 Primary thunderclap headache: Secondary | ICD-10-CM | POA: Diagnosis not present

## 2020-05-14 MED ORDER — BENZONATATE 200 MG PO CAPS: 200.0000 mg | ORAL_CAPSULE | Freq: Three times a day (TID) | ORAL | 2 refills | Status: DC | PRN

## 2020-05-14 MED ORDER — TOPIRAMATE 50 MG PO TABS: ORAL_TABLET | ORAL | 3 refills | Status: DC

## 2020-05-18 ENCOUNTER — Encounter: Payer: Self-pay | Admitting: Family Medicine

## 2020-05-18 ENCOUNTER — Other Ambulatory Visit: Payer: Self-pay

## 2020-05-18 ENCOUNTER — Ambulatory Visit (HOSPITAL_BASED_OUTPATIENT_CLINIC_OR_DEPARTMENT_OTHER)
Admission: RE | Admit: 2020-05-18 | Discharge: 2020-05-18 | Disposition: A | Discharge status: Home / Self Care | Payer: Medicare Other | Source: Ambulatory Visit | Attending: Family Medicine | Admitting: Family Medicine

## 2020-05-18 DIAGNOSIS — M25531 Pain in right wrist: Secondary | ICD-10-CM | POA: Diagnosis present

## 2020-05-18 DIAGNOSIS — R0602 Shortness of breath: Secondary | ICD-10-CM | POA: Insufficient documentation

## 2020-05-21 ENCOUNTER — Ambulatory Visit: Payer: Medicare Other | Admitting: Neurology

## 2020-05-29 ENCOUNTER — Encounter: Payer: Self-pay | Admitting: Family Medicine

## 2020-05-29 DIAGNOSIS — J45998 Other asthma: Secondary | ICD-10-CM

## 2020-05-30 ENCOUNTER — Encounter: Payer: Self-pay | Admitting: Family Medicine

## 2020-05-30 DIAGNOSIS — R0602 Shortness of breath: Secondary | ICD-10-CM

## 2020-05-30 MED ORDER — MONTELUKAST SODIUM 10 MG PO TABS: 10.0000 mg | ORAL_TABLET | Freq: Every day | ORAL | 3 refills | Status: DC

## 2020-05-31 MED ORDER — ALBUTEROL SULFATE HFA 108 (90 BASE) MCG/ACT IN AERS: 2.0000 | INHALATION_SPRAY | Freq: Four times a day (QID) | RESPIRATORY_TRACT | 0 refills | Status: DC | PRN

## 2020-05-31 NOTE — Addendum Note (Signed)
Addended by: Lamar Blinks C on: 05/31/2020 07:51 PM   Modules accepted: Orders

## 2020-06-01 MED ORDER — PREDNISONE 20 MG PO TABS: ORAL_TABLET | ORAL | 0 refills | Status: DC

## 2020-06-01 NOTE — Addendum Note (Signed)
Addended by: Darreld Mclean on: 06/01/2020 07:58 PM   Modules accepted: Orders

## 2020-06-02 DIAGNOSIS — C4491 Basal cell carcinoma of skin, unspecified: Secondary | ICD-10-CM | POA: Insufficient documentation

## 2020-06-02 DIAGNOSIS — R350 Frequency of micturition: Secondary | ICD-10-CM | POA: Insufficient documentation

## 2020-06-02 DIAGNOSIS — G4700 Insomnia, unspecified: Secondary | ICD-10-CM | POA: Insufficient documentation

## 2020-06-02 DIAGNOSIS — M797 Fibromyalgia: Secondary | ICD-10-CM | POA: Insufficient documentation

## 2020-06-02 DIAGNOSIS — B009 Herpesviral infection, unspecified: Secondary | ICD-10-CM | POA: Insufficient documentation

## 2020-06-02 DIAGNOSIS — M1990 Unspecified osteoarthritis, unspecified site: Secondary | ICD-10-CM | POA: Insufficient documentation

## 2020-06-02 DIAGNOSIS — J439 Emphysema, unspecified: Secondary | ICD-10-CM | POA: Insufficient documentation

## 2020-06-02 DIAGNOSIS — I10 Essential (primary) hypertension: Secondary | ICD-10-CM | POA: Insufficient documentation

## 2020-06-02 DIAGNOSIS — N400 Enlarged prostate without lower urinary tract symptoms: Secondary | ICD-10-CM | POA: Insufficient documentation

## 2020-06-02 DIAGNOSIS — K219 Gastro-esophageal reflux disease without esophagitis: Secondary | ICD-10-CM | POA: Insufficient documentation

## 2020-06-02 DIAGNOSIS — R519 Headache, unspecified: Secondary | ICD-10-CM | POA: Insufficient documentation

## 2020-06-02 DIAGNOSIS — F419 Anxiety disorder, unspecified: Secondary | ICD-10-CM | POA: Insufficient documentation

## 2020-06-02 DIAGNOSIS — R0683 Snoring: Secondary | ICD-10-CM | POA: Insufficient documentation

## 2020-06-02 DIAGNOSIS — K120 Recurrent oral aphthae: Secondary | ICD-10-CM | POA: Insufficient documentation

## 2020-06-02 DIAGNOSIS — Z8669 Personal history of other diseases of the nervous system and sense organs: Secondary | ICD-10-CM | POA: Insufficient documentation

## 2020-06-02 DIAGNOSIS — G4733 Obstructive sleep apnea (adult) (pediatric): Secondary | ICD-10-CM | POA: Insufficient documentation

## 2020-06-02 DIAGNOSIS — R1310 Dysphagia, unspecified: Secondary | ICD-10-CM | POA: Insufficient documentation

## 2020-06-02 DIAGNOSIS — G5760 Lesion of plantar nerve, unspecified lower limb: Secondary | ICD-10-CM | POA: Insufficient documentation

## 2020-06-02 DIAGNOSIS — F32A Depression, unspecified: Secondary | ICD-10-CM | POA: Insufficient documentation

## 2020-06-02 DIAGNOSIS — M545 Low back pain, unspecified: Secondary | ICD-10-CM | POA: Insufficient documentation

## 2020-06-07 ENCOUNTER — Encounter: Payer: Self-pay | Admitting: Family Medicine

## 2020-06-07 NOTE — Progress Notes (Signed)
[facility] [place], [place], [place] [address] [phone]  Date:  06/10/2020   Name:  Patrick [name]   DOB:  1950-11-09   MRN:  [id number]  PCP:  Darreld Mclean, MD    Chief Complaint: Hearing Loss (Possible wax build up?)   History of Present Illness:  Patrick [name] is a 70 y.o. very pleasant male patient who presents with the following:  Pt here today with concern of possible ear wax Last seen by myself earlier this month   Patient withhistory of sleep apnea, anxiety, chronic back paintreated with Lyrica, hydrocodone, fatigue, GERD  He notes that his left sided hearing hearing seems to come and go- like there might be wax in his ear.  He also wonders if there could be a problem with his hearing aid He want [place] to check for wax before he made appointment with his hearing aid specialist Not painful, no injury   Patient Active Problem List   Diagnosis Date Noted  . Urinary frequency   . Recurrent canker sores   . Primary snoring   . OSA (obstructive sleep apnea)   . Morton's neuroma   . Lower back pain   . Insomnia   . Hypertension   . History of migraine   . Herpes simplex type 1 infection   . GERD (gastroesophageal reflux disease)   . Headache   . Fibromyalgia   . Emphysema of lung ([facility])   . Dysphagia   . Depression   . BPH (benign prostatic hyperplasia)   . Basal cell carcinoma   . Arthritis   . Anxiety   . Chronic cough 02/09/2019  . Hearing loss 12/15/2016  . Chronic fatigue 10/09/2015  . Generalized anxiety disorder 10/09/2015  . Memory loss 10/09/2015  . Obstructive sleep apnea of adult 02/09/2015  . Benign prostatic hyperplasia with urinary obstruction 11/18/2013  . Acid reflux 11/18/2013  . Degenerative arthritis of lumbar spine 11/18/2013  . Headache, migraine 11/18/2013  . External hemorrhoids with other complication 34/19/6222  . Restless leg 05/27/2009     Past Medical History:  Diagnosis Date  . Anxiety   . Arthritis   . Basal cell carcinoma   . BPH (benign prostatic hyperplasia)   . Depression   . Dysphagia   . Emphysema of lung ([facility])   . Fibromyalgia   . GERD (gastroesophageal reflux disease)    otc  tums  . Headache    migraines  . Herpes simplex type 1 infection   . History of migraine   . Hypertension    no longer on medication  . Insomnia   . Lower back pain   . Morton's neuroma   . OSA (obstructive sleep apnea)   . Primary snoring   . Recurrent canker sores   . Urinary frequency     Past Surgical History:  Procedure Laterality Date  . EPIDIDYMECTOMY     for spermatocele  . EYE SURGERY Bilateral    lasik eye surgery  . FOOT SURGERY Left   . INGUINAL HERNIA REPAIR Left    01/25/1999  . MOUTH SURGERY    . pancreatic surgery r/t trauma    . SEPTOPLASTY N/A 02/09/2015   Procedure: SEPTOPLASTY;  Surgeon: Rozetta Nunnery, MD;  Location: [facility];  Service: ENT;  Laterality: N/A;  . SKIN CANCER EXCISION    . TONSILLECTOMY    . TRANSURETHRAL INCISION OF PROSTATE    .  TURBINATE REDUCTION Bilateral 02/09/2015   Procedure: BILATERAL TURBINATE REDUCTION;  Surgeon: Rozetta Nunnery, MD;  Location: [facility];  Service: ENT;  Laterality: Bilateral;  . UVULOPALATOPHARYNGOPLASTY N/A 02/09/2015   Procedure: UVULOPALATOPHARYNGOPLASTY (UPPP);  Surgeon: Rozetta Nunnery, MD;  Location: [facility];  Service: ENT;  Laterality: N/A;    Social History   Tobacco Use  . Smoking status: Former Smoker    Packs/day: 2.00    Years: 30.00    Pack years: 60.00    Types: Cigarettes    Quit date: 10/29/2005    Years since quitting: 14.6  . Smokeless tobacco: Never Used  Substance Use Topics  . Alcohol use: Yes    Alcohol/week: 1.0 standard drink    Types: 1 Cans of beer per week    Comment: 1 beer every other day  . Drug use: No    Family History  Problem Relation Age of Onset  . Coronary artery disease Mother   .  Stroke Mother   . Hypertension Father   . Anxiety disorder Brother   . Skin cancer Brother   . Stroke Brother     No Known Allergies  Medication list has been reviewed and updated.  Current Outpatient Medications on File Prior to Visit  Medication Sig Dispense Refill  . albuterol (VENTOLIN HFA) 108 (90 Base) MCG/ACT inhaler Inhale 2 puffs into the lungs every 6 (six) hours as needed for wheezing or shortness of breath. 18 g 2  . albuterol (VENTOLIN HFA) 108 (90 Base) MCG/ACT inhaler Inhale 2 puffs into the lungs every 6 (six) hours as needed for wheezing or shortness of breath. 1 each 0  . benzonatate (TESSALON) 200 MG capsule Take 1 capsule (200 mg total) by mouth 3 (three) times daily as needed for cough. 60 capsule 2  . clonazePAM (KLONOPIN) 0.5 MG tablet Take 1-2 tablets (0.5-1 mg total) by mouth at bedtime. As needed for sleep 180 tablet 1  . famotidine (PEPCID) 20 MG tablet Take once or twice a day as needed to control GERD 180 tablet 3  . fluticasone (FLONASE) 50 MCG/ACT nasal spray Place 2 sprays into both nostrils daily. 43 g 3  . fluticasone furoate-vilanterol (BREO ELLIPTA) 200-25 MCG/INH AEPB Inhale 1 puff into the lungs daily. 1 each 0  . HYDROcodone-acetaminophen (NORCO/VICODIN) 5-325 MG tablet Take 1 tablet by mouth every 8 (eight) hours as needed for moderate pain. 45 tablet 0  . indomethacin (INDOCIN) 25 MG capsule Take 1 to 2 capsules three times daily as needed for headache. 20 capsule 0  . Melatonin 5 MG TABS Take 5 mg by mouth at bedtime.    . methylphenidate (RITALIN) 20 MG tablet Take 1 tablet (20 mg total) by mouth 2 (two) times daily. 180 tablet 0  . montelukast (SINGULAIR) 10 MG tablet Take 1 tablet (10 mg total) by mouth at bedtime. 90 tablet 3  . pregabalin (LYRICA) 100 MG capsule Take 2 capsules (200 mg total) by mouth at bedtime. 180 capsule 3  . rosuvastatin (CRESTOR) 10 MG tablet Take 1 tablet (10 mg total) by mouth daily. 90 tablet 3  . sucralfate  (CARAFATE) 1 g tablet Take 1 tablet (1 g total) by mouth 4 (four) times daily -  with meals and at bedtime. 40 tablet 0  . SUMAtriptan (IMITREX) 100 MG tablet Take 1 tablet earliest onset of migraine.  May repeat in 2 hours if headache persists or recurs.  Maximum 2 tablets in 24 hours. 10 tablet 5  .  tolterodine (DETROL LA) 4 MG 24 hr capsule Take 1 capsule (4 mg total) by mouth daily. 90 capsule 3  . topiramate (TOPAMAX) 100 MG tablet Take 100 mg by mouth daily.    . traZODone (DESYREL) 150 MG tablet Take 150 mg at bedtime as needed for sleep 90 tablet 3  . vitamin B-12 (CYANOCOBALAMIN) 500 MCG tablet Take 500 mcg by mouth daily.     No current facility-administered medications on file prior to visit.    Review of Systems:  As per HPI- otherwise negative.   Physical Examination: Vitals:   06/10/20 1336  BP: 126/72  Pulse: 90  Resp: 17  Temp: (!) 97.1 F (36.2 C)  SpO2: 99%   Vitals:   06/10/20 1336  Weight: 189 lb (85.7 kg)  Height: 6' (1.829 m)   Body mass index is 25.63 kg/m. Ideal Body Weight: Weight in (lb) to have BMI = 25: 183.9  GEN: No acute distress; alert,appropriate. PULM: Breathing comfortably in no respiratory distress PSYCH: Normally interactive.  Patient looks well.  Both external ear canals and tympanic membranes are normal   Assessment and Plan: Bilateral hearing loss, unspecified hearing loss type  Patient is here today wondering if he either has earwax buildup or if his hearing aids are malfunctioning.  I advised him that ear canals appear normal, no wax occlusion.  He plans to see his audiologist as soon as possible for hearing aid check  No charge for visit today This visit occurred during the SARS-CoV-2 public health emergency.  Safety protocols were in place, including screening questions prior to the visit, additional usage of staff PPE, and extensive cleaning of exam room while observing appropriate contact time as indicated for disinfecting  solutions.    Signed Lamar Blinks, MD

## 2020-06-10 ENCOUNTER — Encounter: Payer: Self-pay | Admitting: Family Medicine

## 2020-06-10 ENCOUNTER — Other Ambulatory Visit: Payer: Self-pay

## 2020-06-10 ENCOUNTER — Ambulatory Visit (INDEPENDENT_AMBULATORY_CARE_PROVIDER_SITE_OTHER): Payer: Medicare Other | Admitting: Family Medicine

## 2020-06-10 VITALS — BP 126/72 | HR 90 | Temp 97.1°F | Resp 17 | Ht 72.0 in | Wt 189.0 lb

## 2020-06-10 DIAGNOSIS — H9193 Unspecified hearing loss, bilateral: Secondary | ICD-10-CM

## 2020-06-11 ENCOUNTER — Ambulatory Visit: Payer: Medicare Other | Admitting: Cardiology

## 2020-06-20 NOTE — Progress Notes (Signed)
Cardiology Office Note:    Date:  06/22/2020   ID:  Patrick [name], DOB 1951-02-19, MRN [id number]  PCP:  Darreld Mclean, MD  Cardiologist:  Shirlee More, MD   Referring MD: Darreld Mclean, MD  ASSESSMENT:    1. Dyspnea on exertion   2. Hyperlipidemia, unspecified hyperlipidemia type   3. Primary hypertension    PLAN:    In order of problems listed above:  1. Differential diagnosis includes chronic lung injury secondary to cigarette smoking especially with his chronic cough although his chest x-ray was normal versus cardiac dysfunction or other heart failure though he has no edema or findings physical exam and anginal equivalent.  We discussed further evaluation.  He would like to pursue the issue will be scheduled for echocardiogram to look for pulmonary hypertension findings of heart failure systolic or diastolic or unrecognized valvular heart disease and cardiac CTA to define the presence or absence of CAD functional importance.  I told him in the end that this may be pulmonary etiology and partly cardiac CTA will get a CT of his chest.  He may need further evaluation like PFTs. 2. Continue statin with his calcium score is 0 he could consider withdrawal 3. Presently not on antihypertensive agents  Next appointment 6 weeks   Medication Adjustments/Labs and Tests Ordered: Current medicines are reviewed at length with the patient today.  Concerns regarding medicines are outlined above.  No orders of the defined types were placed in this encounter.  Meds ordered this encounter  Medications  . metoprolol tartrate (LOPRESSOR) 100 MG tablet    Sig: Take 1 tablet (100 mg total) by mouth once for 1 dose. Take two hours prior to your cardiac CT    Dispense:  1 tablet    Refill:  0     No chief complaint on file.   History of Present Illness:    Patrick [name] is a 70 y.o. male with a history of hypertension presently not on medication emphysema with  60-pack-year smoking history and obstructive sleep apnea who is being seen today for the evaluation of shortness of breath   the request of Copland, Gay Filler, MD.  Recent chest x-ray 05/18/2020 was normal. His EKG 05/14/2020 was normal except for possible left atrial enlargement. Previously in 2019 he had an exercise tolerance test performed showing a normal ST segment response no chest pain and normal exercise tolerance of 12.2 METS.   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 Is vague but he finds himself breathless with activities more than usual that he did previously without trouble is gotten worse than a few times he has to stop and rest to recover. He also has had a chronic cough and has used bronchodilators steroids cough suppressant but is not wheezing. No edema orthopnea chest pain palpitation or syncope. He has no history of congenital or rheumatic heart disease. He is concerned that he may have unrecognized underlying heart disease especially in the context of previous cigarette smoking  Past Medical History:  Diagnosis Date  . Anxiety   . Arthritis   . Basal cell carcinoma   . BPH (benign prostatic hyperplasia)   . Depression   . Dysphagia   . Emphysema of lung ([place])   . Fibromyalgia   . GERD (gastroesophageal reflux disease)    otc  tums  . Headache    migraines  .  Herpes simplex type 1 infection   . History of migraine   . Hypertension    no longer on medication  . Insomnia   . Lower back pain   . Morton's neuroma   . OSA (obstructive sleep apnea)   . Primary snoring   . Recurrent canker sores   . Urinary frequency     Past Surgical History:  Procedure Laterality Date  . EPIDIDYMECTOMY     for spermatocele  . EYE SURGERY Bilateral    lasik eye surgery  . FOOT SURGERY Left   . INGUINAL HERNIA REPAIR Left    01/25/1999  . MOUTH SURGERY    . pancreatic  surgery r/t trauma    . SEPTOPLASTY N/A 02/09/2015   Procedure: SEPTOPLASTY;  Surgeon: Rozetta Nunnery, MD;  Location: [facility];  Service: ENT;  Laterality: N/A;  . SKIN CANCER EXCISION    . TONSILLECTOMY    . TRANSURETHRAL INCISION OF PROSTATE    . TURBINATE REDUCTION Bilateral 02/09/2015   Procedure: BILATERAL TURBINATE REDUCTION;  Surgeon: Rozetta Nunnery, MD;  Location: [facility];  Service: ENT;  Laterality: Bilateral;  . UVULOPALATOPHARYNGOPLASTY N/A 02/09/2015   Procedure: UVULOPALATOPHARYNGOPLASTY (UPPP);  Surgeon: Rozetta Nunnery, MD;  Location: [facility] OR;  Service: ENT;  Laterality: N/A;    Current Medications: Current Meds  Medication Sig  . albuterol (VENTOLIN HFA) 108 (90 Base) MCG/ACT inhaler Inhale 2 puffs into the lungs every 6 (six) hours as needed for wheezing or shortness of breath.  Marland Kitchen albuterol (VENTOLIN HFA) 108 (90 Base) MCG/ACT inhaler Inhale 2 puffs into the lungs every 6 (six) hours as needed for wheezing or shortness of breath.  . benzonatate (TESSALON) 200 MG capsule Take 1 capsule (200 mg total) by mouth 3 (three) times daily as needed for cough.  . clonazePAM (KLONOPIN) 0.5 MG tablet Take 1-2 tablets (0.5-1 mg total) by mouth at bedtime. As needed for sleep  . famotidine (PEPCID) 20 MG tablet Take once or twice a day as needed to control GERD  . fluticasone (FLONASE) 50 MCG/ACT nasal spray Place 2 sprays into both nostrils daily.  . fluticasone furoate-vilanterol (BREO ELLIPTA) 200-25 MCG/INH AEPB Inhale 1 puff into the lungs daily.  Marland Kitchen HYDROcodone-acetaminophen (NORCO/VICODIN) 5-325 MG tablet Take 1 tablet by mouth every 8 (eight) hours as needed for moderate pain.  . indomethacin (INDOCIN) 25 MG capsule Take 1 to 2 capsules three times daily as needed for headache.  . Melatonin 5 MG TABS Take 5 mg by mouth at bedtime.  . methylphenidate (RITALIN) 20 MG tablet Take 1 tablet (20 mg total) by mouth 2 (two) times daily.  . metoprolol tartrate (LOPRESSOR) 100 MG  tablet Take 1 tablet (100 mg total) by mouth once for 1 dose. Take two hours prior to your cardiac CT  . montelukast (SINGULAIR) 10 MG tablet Take 1 tablet (10 mg total) by mouth at bedtime.  . pregabalin (LYRICA) 100 MG capsule Take 2 capsules (200 mg total) by mouth at bedtime.  . rosuvastatin (CRESTOR) 10 MG tablet Take 1 tablet (10 mg total) by mouth daily.  . sucralfate (CARAFATE) 1 g tablet Take 1 tablet (1 g total) by mouth 4 (four) times daily -  with meals and at bedtime.  . SUMAtriptan (IMITREX) 100 MG tablet Take 1 tablet earliest onset of migraine.  May repeat in 2 hours if headache persists or recurs.  Maximum 2 tablets in 24 hours.  Marland Kitchen tolterodine (DETROL LA) 4 MG 24 hr capsule Take 1  capsule (4 mg total) by mouth daily.  Marland Kitchen topiramate (TOPAMAX) 100 MG tablet Take 100 mg by mouth daily.  . traZODone (DESYREL) 150 MG tablet Take 150 mg at bedtime as needed for sleep  . vitamin B-12 (CYANOCOBALAMIN) 500 MCG tablet Take 500 mcg by mouth daily.     Allergies:   Patient has no known allergies.   Social History   Socioeconomic History  . Marital status: Married    Spouse name: Not on file  . Number of children: 2  . Years of education: BS   . Highest education level: Not on file  Occupational History  . Occupation: Chief Financial Officer  Tobacco Use  . Smoking status: Former Smoker    Packs/day: 2.00    Years: 30.00    Pack years: 60.00    Types: Cigarettes    Quit date: 10/29/2005    Years since quitting: 14.6  . Smokeless tobacco: Never Used  Substance and Sexual Activity  . Alcohol use: Yes    Alcohol/week: 1.0 standard drink    Types: 1 Cans of beer per week    Comment: 1 beer every other day  . Drug use: No  . Sexual activity: Not on file  Other Topics Concern  . Not on file  Social History Narrative   Denies caffeine use    Right handed   Social Determinants of Health   Financial Resource Strain: Not on file  Food Insecurity: Not on file  Transportation Needs: Not on  file  Physical Activity: Not on file  Stress: Not on file  Social Connections: Not on file     Family History: The patient's family history includes Anxiety disorder in his brother; Coronary artery disease in his mother; Hypertension in his father; Skin cancer in his brother; Stroke in his brother and mother.  ROS:   ROS Please see the history of present illness.    all other systems reviewed and are negative.  EKGs/Labs/Other Studies Reviewed:    The following studies were reviewed today:   EKG:  EKG 05/14/2020 independently reviewed shows sinus rhythm 79 bpm and is normal  Recent Labs: 07/31/2019: ALT 21 03/23/2020: BUN 15; Creatinine, Ser 1.04; Hemoglobin 15.5; Platelets 125.0; Potassium 4.3; Sodium 141  Recent Lipid Panel    Component Value Date/Time   CHOL 175 12/18/2019 0849   TRIG 157 (H) 12/18/2019 0849   HDL 49 12/18/2019 0849   CHOLHDL 3.6 12/18/2019 0849   VLDL 32.6 11/28/2018 1350   LDLCALC 101 (H) 12/18/2019 0849    Physical Exam:    VS:  BP 112/82 (BP Location: Right Arm, Patient Position: Sitting)   Pulse 80   Ht 6' (1.829 m)   Wt 187 lb (84.8 kg)   SpO2 99%   BMI 25.36 kg/m     Wt Readings from Last 3 Encounters:  06/22/20 187 lb (84.8 kg)  06/10/20 189 lb (85.7 kg)  05/14/20 191 lb (86.6 kg)     GEN:  Well nourished, well developed in no acute distress HEENT: Normal NECK: No JVD; No carotid bruits LYMPHATICS: No lymphadenopathy CARDIAC: Hyperinflated lungs RRR, no murmurs, rubs, gallops RESPIRATORY:  Clear to auscultation without rales, wheezing or rhonchi  ABDOMEN: Soft, non-tender, non-distended MUSCULOSKELETAL:  No edema; No deformity  SKIN: Warm and dry NEUROLOGIC:  Alert and oriented x 3 PSYCHIATRIC:  Normal affect     Signed, Shirlee More, MD  06/22/2020 8:56 AM    [place]

## 2020-06-22 ENCOUNTER — Ambulatory Visit (INDEPENDENT_AMBULATORY_CARE_PROVIDER_SITE_OTHER): Payer: Medicare Other | Admitting: Cardiology

## 2020-06-22 ENCOUNTER — Other Ambulatory Visit: Payer: Self-pay

## 2020-06-22 ENCOUNTER — Encounter: Payer: Self-pay | Admitting: Cardiology

## 2020-06-22 ENCOUNTER — Ambulatory Visit: Payer: Medicare Other | Attending: Internal Medicine

## 2020-06-22 VITALS — BP 112/82 | HR 80 | Ht 72.0 in | Wt 187.0 lb

## 2020-06-22 DIAGNOSIS — I10 Essential (primary) hypertension: Secondary | ICD-10-CM

## 2020-06-22 DIAGNOSIS — I119 Hypertensive heart disease without heart failure: Secondary | ICD-10-CM

## 2020-06-22 DIAGNOSIS — Z23 Encounter for immunization: Secondary | ICD-10-CM

## 2020-06-22 DIAGNOSIS — R0600 Dyspnea, unspecified: Secondary | ICD-10-CM | POA: Diagnosis not present

## 2020-06-22 DIAGNOSIS — E785 Hyperlipidemia, unspecified: Secondary | ICD-10-CM

## 2020-06-22 DIAGNOSIS — R0609 Other forms of dyspnea: Secondary | ICD-10-CM

## 2020-06-22 MED ORDER — METOPROLOL TARTRATE 100 MG PO TABS: 100.0000 mg | ORAL_TABLET | Freq: Once | ORAL | 0 refills | Status: DC

## 2020-06-22 NOTE — Progress Notes (Signed)
   Covid-19 Vaccination Clinic  Name:  Patrick [name]    MRN: [id number] DOB: 1950-12-21  06/22/2020  [name] was observed post Covid-19 immunization for 15 minutes without incident. He was provided with Vaccine Information Sheet and instruction to access the V-Safe system.   [name] was instructed to call 911 with any severe reactions post vaccine: Marland Kitchen Difficulty breathing  . Swelling of face and throat  . A fast heartbeat  . A bad rash all over body  . Dizziness and weakness   Immunizations Administered    Name Date Dose VIS Date Route   Moderna Covid-19 Booster Vaccine 06/22/2020  9:26 AM 0.25 mL 01/01/2020 Intramuscular   Manufacturer: Levan Hurst   Lot: [id number]   [place]: [phone]

## 2020-06-22 NOTE — Patient Instructions (Addendum)
Medication Instructions:  Your physician recommends that you continue on your current medications as directed. Please refer to the Current Medication list given to you today.  *If you need a refill on your cardiac medications before your next appointment, please call your pharmacy*   Lab Work: Your physician recommends that you return for lab work in: Within one week of your cardiac CT  BMP  If you have labs (blood work) drawn today and your tests are completely normal, you will receive your results only by: Marland Kitchen MyChart Message (if you have MyChart) OR . A paper copy in the mail If you have any lab test that is abnormal or we need to change your treatment, we will call you to review the results.   Testing/Procedures: Your physician has requested that you have an echocardiogram. Echocardiography is a painless test that uses sound waves to create images of your heart. It provides your doctor with information about the size and shape of your heart and how well your heart's chambers and valves are working. This procedure takes approximately one hour. There are no restrictions for this procedure.  Your cardiac CT will be scheduled at the below location:   [facility] [address] [place], [place] [address] [phone]  If scheduled at [facility], please arrive at the [facility] main entrance (entrance A) of [facility] 30 minutes prior to test start time. Proceed to the [facility] Radiology Department (first floor) to check-in and test prep.  Please follow these instructions carefully (unless otherwise directed):  On the Night Before the Test: . Be sure to Drink plenty of water. . Do not consume any caffeinated/decaffeinated beverages or chocolate 12 hours prior to your test. . Do not take any antihistamines 12 hours prior to your test.  On the Day of the Test: . Drink plenty of water until 1 hour prior to the test. . Do not eat any food 4 hours  prior to the test. . You may take your regular medications prior to the test.  . Take metoprolol (Lopressor) two hours prior to test.       After the Test: . Drink plenty of water. . After receiving IV contrast, you may experience a mild flushed feeling. This is normal. . On occasion, you may experience a mild rash up to 24 hours after the test. This is not dangerous. If this occurs, you can take Benadryl 25 mg and increase your fluid intake. . If you experience trouble breathing, this can be serious. If it is severe call 911 IMMEDIATELY. If it is mild, please call our office. . If you take any of these medications: Glipizide/Metformin, Avandament, Glucavance, please do not take 48 hours after completing test unless otherwise instructed.   Once we have confirmed authorization from your insurance company, we will call you to set up a date and time for your test. Based on how quickly your insurance processes prior authorizations requests, please allow up to 4 weeks to be contacted for scheduling your Cardiac CT appointment. Be advised that routine Cardiac CT appointments could be scheduled as many as 8 weeks after your provider has ordered it.  For non-scheduling related questions, please contact the cardiac imaging nurse navigator should you have any questions/concerns: Marchia Bond, Cardiac Imaging Nurse Navigator Gordy Clement, Cardiac Imaging Nurse Navigator [facility] Direct Office Dial: [phone]   For scheduling needs, including cancellations and rescheduling, please call [place], [phone].  Follow-Up: At [facility], you and your health needs are our priority.  As part of our continuing mission to provide you with exceptional heart care, we have created designated Provider Care Teams.  These Care Teams include your primary Cardiologist (physician) and Advanced Practice Providers (APPs -  Physician Assistants and Nurse Practitioners) who all  work together to provide you with the care you need, when you need it.  We recommend signing up for the patient portal called "MyChart".  Sign up information is provided on this After Visit Summary.  MyChart is used to connect with patients for Virtual Visits (Telemedicine).  Patients are able to view lab/test results, encounter notes, upcoming appointments, etc.  Non-urgent messages can be sent to your provider as well.   To learn more about what you can do with MyChart, go to [email or web address].    Your next appointment:   6 week(s)  The format for your next appointment:   In Person  Provider:   Shirlee More, MD   Other Instructions

## 2020-06-24 ENCOUNTER — Encounter: Payer: Self-pay | Admitting: Family Medicine

## 2020-06-24 DIAGNOSIS — R059 Cough, unspecified: Secondary | ICD-10-CM

## 2020-06-24 MED ORDER — UMECLIDINIUM BROMIDE 62.5 MCG/INH IN AEPB: 1.0000 | INHALATION_SPRAY | Freq: Every day | RESPIRATORY_TRACT | 1 refills | Status: DC

## 2020-06-24 NOTE — Addendum Note (Signed)
Addended by: Lamar Blinks C on: 06/24/2020 04:17 PM   Modules accepted: Orders

## 2020-06-24 NOTE — Addendum Note (Signed)
Addended by: Lamar Blinks C on: 06/24/2020 04:03 PM   Modules accepted: Orders

## 2020-06-26 ENCOUNTER — Other Ambulatory Visit (HOSPITAL_BASED_OUTPATIENT_CLINIC_OR_DEPARTMENT_OTHER): Payer: Self-pay

## 2020-06-26 MED ORDER — COVID-19 MRNA VACC (MODERNA) 100 MCG/0.5ML IM SUSP
INTRAMUSCULAR | 0 refills | Status: DC
  Filled 2020-06-26: qty 0.25, 1d supply, fill #0

## 2020-07-03 ENCOUNTER — Other Ambulatory Visit (HOSPITAL_COMMUNITY): Payer: Self-pay | Admitting: Emergency Medicine

## 2020-07-03 MED ORDER — IVABRADINE HCL 5 MG PO TABS: 10.0000 mg | ORAL_TABLET | Freq: Once | ORAL | 0 refills | Status: AC

## 2020-07-03 NOTE — Progress Notes (Signed)
10mg  ivabradine one time dose prescribed for CCTA HR control.  Marchia Bond RN Navigator Cardiac Imaging [facility] [phone] Office  [phone] Cell

## 2020-07-04 LAB — BASIC METABOLIC PANEL
BUN/Creatinine Ratio: 15 (ref 10–24)
BUN: 16 mg/dL (ref 8–27)
CO2: 21 mmol/L (ref 20–29)
Calcium: 9.1 mg/dL (ref 8.6–10.2)
Chloride: 108 mmol/L — ABNORMAL HIGH (ref 96–106)
Creatinine, Ser: 1.06 mg/dL (ref 0.76–1.27)
Glucose: 99 mg/dL (ref 65–99)
Potassium: 4.3 mmol/L (ref 3.5–5.2)
Sodium: 143 mmol/L (ref 134–144)
eGFR: 77 mL/min/{1.73_m2} (ref >59)

## 2020-07-06 ENCOUNTER — Telehealth (HOSPITAL_COMMUNITY): Payer: Self-pay | Admitting: Emergency Medicine

## 2020-07-06 NOTE — Telephone Encounter (Signed)
Attempted to call patient regarding upcoming cardiac CT appointment. °Left message on voicemail with name and callback number °Dillard Pascal RN Navigator Cardiac Imaging °[facility] °[phone] Office °[phone] Cell ° °

## 2020-07-07 ENCOUNTER — Telehealth (HOSPITAL_COMMUNITY): Payer: Self-pay | Admitting: Emergency Medicine

## 2020-07-07 NOTE — Telephone Encounter (Signed)
Reaching out to patient to offer assistance regarding upcoming cardiac imaging study; pt verbalizes understanding of appt date/time, parking situation and where to check in, pre-test NPO status and medications ordered, and verified current allergies; name and call back number provided for further questions should they arise Patrick Bond RN Navigator Cardiac Imaging [facility] [phone] office [phone] cell  10mg  ivabradine 2 hr prior to scan

## 2020-07-08 ENCOUNTER — Ambulatory Visit (HOSPITAL_COMMUNITY)
Admission: RE | Admit: 2020-07-08 | Discharge: 2020-07-08 | Disposition: A | Discharge status: Home / Self Care | Payer: Medicare Other | Source: Ambulatory Visit | Attending: Cardiology | Admitting: Cardiology

## 2020-07-08 ENCOUNTER — Encounter (HOSPITAL_COMMUNITY): Payer: Self-pay

## 2020-07-08 ENCOUNTER — Other Ambulatory Visit: Payer: Self-pay

## 2020-07-08 ENCOUNTER — Other Ambulatory Visit (HOSPITAL_COMMUNITY): Payer: Self-pay | Admitting: Cardiology

## 2020-07-08 DIAGNOSIS — R931 Abnormal findings on diagnostic imaging of heart and coronary circulation: Secondary | ICD-10-CM | POA: Insufficient documentation

## 2020-07-08 DIAGNOSIS — I10 Essential (primary) hypertension: Secondary | ICD-10-CM | POA: Diagnosis present

## 2020-07-08 DIAGNOSIS — I2510 Atherosclerotic heart disease of native coronary artery without angina pectoris: Secondary | ICD-10-CM | POA: Diagnosis not present

## 2020-07-08 DIAGNOSIS — R0600 Dyspnea, unspecified: Secondary | ICD-10-CM | POA: Diagnosis present

## 2020-07-08 DIAGNOSIS — Z006 Encounter for examination for normal comparison and control in clinical research program: Secondary | ICD-10-CM

## 2020-07-08 DIAGNOSIS — E785 Hyperlipidemia, unspecified: Secondary | ICD-10-CM | POA: Diagnosis present

## 2020-07-08 DIAGNOSIS — I119 Hypertensive heart disease without heart failure: Secondary | ICD-10-CM | POA: Insufficient documentation

## 2020-07-08 MED ORDER — IOHEXOL 350 MG/ML SOLN
95.0000 mL | Freq: Once | INTRAVENOUS | Status: AC | PRN
  Administered 2020-07-08: 95 mL via INTRAVENOUS

## 2020-07-08 MED ORDER — NITROGLYCERIN 0.4 MG SL SUBL
0.8000 mg | SUBLINGUAL_TABLET | Freq: Once | SUBLINGUAL | Status: AC
  Administered 2020-07-08: 0.8 mg via SUBLINGUAL

## 2020-07-08 MED ORDER — NITROGLYCERIN 0.4 MG SL SUBL
SUBLINGUAL_TABLET | SUBLINGUAL | Status: AC
  Filled 2020-07-08: qty 2

## 2020-07-08 NOTE — Research (Signed)
IDENTIFY Informed Consent                  Subject Name: Patrick [name]    Subject met inclusion and exclusion criteria.  The informed consent form, study requirements and expectations were reviewed with the subject and questions and concerns were addressed prior to the signing of the consent form.  The subject verbalized understanding of the trial requirements.  The subject agreed to participate in the IDENTIFY trial and signed the informed consent at 09:20AM on 07/08/20.  The informed consent was obtained prior to performance of any protocol-specific procedures for the subject.  A copy of the signed informed consent was given to the subject and a copy was placed in the subject's medical record.   Meade Maw , Naval architect

## 2020-07-12 ENCOUNTER — Encounter: Payer: Self-pay | Admitting: Family Medicine

## 2020-07-13 ENCOUNTER — Encounter: Payer: Self-pay | Admitting: Family Medicine

## 2020-07-13 ENCOUNTER — Telehealth: Payer: Self-pay

## 2020-07-13 ENCOUNTER — Telehealth: Payer: Self-pay | Admitting: Cardiology

## 2020-07-13 ENCOUNTER — Other Ambulatory Visit: Payer: Self-pay | Admitting: Family Medicine

## 2020-07-13 DIAGNOSIS — M47816 Spondylosis without myelopathy or radiculopathy, lumbar region: Secondary | ICD-10-CM

## 2020-07-13 DIAGNOSIS — M5430 Sciatica, unspecified side: Secondary | ICD-10-CM

## 2020-07-13 DIAGNOSIS — R1013 Epigastric pain: Secondary | ICD-10-CM

## 2020-07-13 MED ORDER — HYDROCODONE-ACETAMINOPHEN 5-325 MG PO TABS: 1.0000 | ORAL_TABLET | Freq: Three times a day (TID) | ORAL | 0 refills | Status: DC | PRN

## 2020-07-13 MED ORDER — PANTOPRAZOLE SODIUM 40 MG PO TBEC
40.0000 mg | DELAYED_RELEASE_TABLET | Freq: Every day | ORAL | 3 refills | Status: DC
Start: 2020-07-13 — End: 2020-11-26

## 2020-07-13 MED ORDER — SUCRALFATE 1 G PO TABS: 1.0000 g | ORAL_TABLET | Freq: Three times a day (TID) | ORAL | 0 refills | Status: DC

## 2020-07-13 NOTE — Telephone Encounter (Signed)
Please re-sign to correct pharmacy.

## 2020-07-13 NOTE — Telephone Encounter (Signed)
Left a message for the patient letting him know that once the results of this test are reviewed by the doctor we will give him a call with the results. I also gave him our call back number to call back with any other questions or concerns.

## 2020-07-13 NOTE — Telephone Encounter (Signed)
-----   Message from Berniece Salines, DO sent at 07/13/2020  2:24 PM EDT ----- This is Dr. Joya Gaskins patient: Calcium score is 111, you do have mild coronary artery disease.  For now we will continue medical management.  The noncardiac portion of your tests is suggesting pulmonary arterial hypertension.  Which could be the source of your shortness of breath.  It is important that you keep your appointment for May 18 for your echocardiogram after that time we will have more information about the pulmonary arterial hypertension. I will recommend setting up appointment to see Dr. Bettina Gavia after getting your echocardiogram.

## 2020-07-13 NOTE — Telephone Encounter (Signed)
Spoke with patient regarding results and recommendation.  Patient verbalizes understanding and is agreeable to plan of care. Advised patient to call back with any issues or concerns.  

## 2020-07-13 NOTE — Telephone Encounter (Signed)
Patient is requesting to go over his CT results.

## 2020-07-13 NOTE — Telephone Encounter (Signed)
Sent to the schedulers to set up an appt

## 2020-07-13 NOTE — Telephone Encounter (Signed)
-----   Message from Milus Banister, MD sent at 07/13/2020  7:40 AM EDT ----- Got it, thanks   Ellamarie Naeve, Can you please contact him and offer my first available return office visit appointment?  Not with an extender and do not double book.  Thank you    ----- Message ----- From: Darreld Mclean, MD Sent: 07/13/2020   7:00 AM EDT To: Milus Banister, MD  Jacqulyn Cane- this patient is having stomach pain, and I saw that 5 years ago he had diffuse inflammation on his endoscopy. Could you ask your staff to bring him in?  I am worried about an ulcer.  Thank you so much!

## 2020-07-15 ENCOUNTER — Ambulatory Visit (INDEPENDENT_AMBULATORY_CARE_PROVIDER_SITE_OTHER): Payer: Medicare Other | Admitting: Family Medicine

## 2020-07-15 ENCOUNTER — Encounter: Payer: Self-pay | Admitting: Family Medicine

## 2020-07-15 ENCOUNTER — Other Ambulatory Visit: Payer: Self-pay

## 2020-07-15 VITALS — BP 118/80 | HR 80 | Temp 98.1°F | Ht 72.0 in | Wt 182.0 lb

## 2020-07-15 DIAGNOSIS — R531 Weakness: Secondary | ICD-10-CM | POA: Diagnosis not present

## 2020-07-15 DIAGNOSIS — R202 Paresthesia of skin: Secondary | ICD-10-CM

## 2020-07-15 DIAGNOSIS — R059 Cough, unspecified: Secondary | ICD-10-CM | POA: Diagnosis not present

## 2020-07-15 DIAGNOSIS — R7309 Other abnormal glucose: Secondary | ICD-10-CM | POA: Diagnosis not present

## 2020-07-15 DIAGNOSIS — R5383 Other fatigue: Secondary | ICD-10-CM | POA: Diagnosis not present

## 2020-07-15 DIAGNOSIS — Z131 Encounter for screening for diabetes mellitus: Secondary | ICD-10-CM

## 2020-07-15 DIAGNOSIS — E559 Vitamin D deficiency, unspecified: Secondary | ICD-10-CM

## 2020-07-15 DIAGNOSIS — K219 Gastro-esophageal reflux disease without esophagitis: Secondary | ICD-10-CM

## 2020-07-15 LAB — COMPREHENSIVE METABOLIC PANEL
ALT: 24 U/L (ref 0–53)
AST: 21 U/L (ref 0–37)
Albumin: 4.5 g/dL (ref 3.5–5.2)
Alkaline Phosphatase: 58 U/L (ref 39–117)
BUN: 16 mg/dL (ref 6–23)
CO2: 32 mEq/L (ref 19–32)
Calcium: 9.6 mg/dL (ref 8.4–10.5)
Chloride: 105 mEq/L (ref 96–112)
Creatinine, Ser: 0.97 mg/dL (ref 0.40–1.50)
GFR: 80.65 mL/min (ref >60.00)
Glucose, Bld: 101 mg/dL — ABNORMAL HIGH (ref 70–99)
Potassium: 4.2 mEq/L (ref 3.5–5.1)
Sodium: 141 mEq/L (ref 135–145)
Total Bilirubin: 0.9 mg/dL (ref 0.2–1.2)
Total Protein: 6.7 g/dL (ref 6.0–8.3)

## 2020-07-15 LAB — C-REACTIVE PROTEIN: CRP: <1 mg/dL (ref 0.5–20.0)

## 2020-07-15 LAB — VITAMIN D 25 HYDROXY (VIT D DEFICIENCY, FRACTURES): VITD: 27.85 ng/mL — ABNORMAL LOW (ref 30.00–100.00)

## 2020-07-15 LAB — VITAMIN B12: Vitamin B-12: 264 pg/mL (ref 211–911)

## 2020-07-15 LAB — HEMOGLOBIN A1C: Hgb A1c MFr Bld: 5.4 % (ref 4.6–6.5)

## 2020-07-15 LAB — TSH: TSH: 2.62 u[IU]/mL (ref 0.35–4.50)

## 2020-07-15 LAB — SEDIMENTATION RATE: Sed Rate: 3 mm/hr (ref 0–20)

## 2020-07-15 MED ORDER — PREDNISONE 20 MG PO TABS: ORAL_TABLET | ORAL | 0 refills | Status: DC

## 2020-07-15 NOTE — Progress Notes (Addendum)
[facility] [place] [facility] [address] [place], [place] [address] [phone]  Date:  07/15/2020   Name:  Patrick [name]   DOB:  1950/10/14   MRN:  [id number]  PCP:  Darreld Mclean, MD    Chief Complaint: Cough (And breathing issues/Go over results)   History of Present Illness:  Patrick [name] is a 70 y.o. very pleasant male patient who presents with the following:  Pt seen today with concern of SOB and generally not feeling well  His cardiologist is DR Bettina Gavia - last visit about 4 weeks ago for SOB He underwent a coronary calcium CT on 4/27-  IMPRESSION: 1. Coronary calcium score of 111. This was 52nd percentile for age and sex matched control. 2.  Normal coronary origin with right dominance. 3.  Mild atherosclerosis.  CAD RADS 2. 4.  Recommend preventive therapy and risk factor modification. 5.  This study has been sent for FFR flow analysis.  He also had a flow reserve study-   IMPRESSION: 1. Coronary CTA FFR flow analysis demonstrates possible flow reduction in the very distal LCx. 2.   Recommend medical management  He is feeling pretty good today but notes he has been struggling much of the time He sometimes feeling weak/ lightheaded He is not having CP He has noted a cough for several months now  He does not feel any tightness or pressure in his heart   Per cardiology notes, there was some concern for possible pulmonary hypertension He is seeing pulmonology later on this month and also will be having an echocardiogram in about 2 weeks  Albuterol does help when he is coughing  We did some steroids in March and they did help temporarily   His father did have what sounds like pulmonary fibrosis- this is thought due to working in [employer], educational  Mother with history of CVA, this is thought to also be the case with his brother at age 68 Patient Active Problem List   Diagnosis Date Noted  . Urinary  frequency   . Recurrent canker sores   . Primary snoring   . OSA (obstructive sleep apnea)   . Morton's neuroma   . Lower back pain   . Insomnia   . Hypertension   . History of migraine   . Herpes simplex type 1 infection   . GERD (gastroesophageal reflux disease)   . Headache   . Fibromyalgia   . Emphysema of lung ([place])   . Dysphagia   . Depression   . BPH (benign prostatic hyperplasia)   . Basal cell carcinoma   . Arthritis   . Anxiety   . Dizziness 11/29/2019  . Acquired mallet deformity of finger of right hand 11/25/2019  . Chronic cough 02/09/2019  . Tear of right rotator cuff 12/13/2017  . Right rotator cuff tendinitis 01/10/2017  . Hearing loss 12/15/2016  . Chronic fatigue 10/09/2015  . Generalized anxiety disorder 10/09/2015  . Memory loss 10/09/2015  . Chronic SI joint pain 05/25/2015  . Recurrent major depressive disorder, in partial remission ([place]) 04/30/2015  . Obstructive sleep apnea of adult 02/09/2015  . Lumbar facet joint pain 04/09/2014  . Benign prostatic hyperplasia with urinary obstruction 11/18/2013  . Acid reflux 11/18/2013  . Degenerative arthritis of lumbar spine 11/18/2013  . Headache, migraine 11/18/2013  . Anal pain 11/18/2013  . Atopic dermatitis 11/18/2013  . Bladder neck contracture 11/18/2013  . Constipation 11/18/2013  .  Crush injury to finger 11/18/2013  . Deviated nasal septum 11/18/2013  . Empty sella syndrome ([place]) 11/18/2013  . Abdominal pain 11/18/2013  . Hematoma, subungual, third finger, left 11/18/2013  . Hypertrophy of nasal turbinates 11/18/2013  . Laceration of third finger, left 11/18/2013  . Lateral epicondylitis 11/18/2013  . Lymph nodes enlarged 11/18/2013  . Nonallopathic lesion of sacral region 11/18/2013  . OAB (overactive bladder) 11/18/2013  . Open fracture of distal phalanx of third finger of left hand 11/18/2013  . Other intervertebral disc degeneration, lumbar region 11/18/2013  . External hemorrhoids with  other complication 78/29/5621  . Restless leg 05/27/2009    Past Medical History:  Diagnosis Date  . Anxiety   . Arthritis   . Basal cell carcinoma   . BPH (benign prostatic hyperplasia)   . Depression   . Dysphagia   . Emphysema of lung ([place])   . Fibromyalgia   . GERD (gastroesophageal reflux disease)    otc  tums  . Headache    migraines  . Herpes simplex type 1 infection   . History of migraine   . Hypertension    no longer on medication  . Insomnia   . Lower back pain   . Morton's neuroma   . OSA (obstructive sleep apnea)   . Primary snoring   . Recurrent canker sores   . Urinary frequency     Past Surgical History:  Procedure Laterality Date  . EPIDIDYMECTOMY     for spermatocele  . EYE SURGERY Bilateral    lasik eye surgery  . FOOT SURGERY Left   . INGUINAL HERNIA REPAIR Left    01/25/1999  . MOUTH SURGERY    . pancreatic surgery r/t trauma    . SEPTOPLASTY N/A 02/09/2015   Procedure: SEPTOPLASTY;  Surgeon: Rozetta Nunnery, MD;  Location: [facility];  Service: ENT;  Laterality: N/A;  . SKIN CANCER EXCISION    . TONSILLECTOMY    . TRANSURETHRAL INCISION OF PROSTATE    . TURBINATE REDUCTION Bilateral 02/09/2015   Procedure: BILATERAL TURBINATE REDUCTION;  Surgeon: Rozetta Nunnery, MD;  Location: [facility];  Service: ENT;  Laterality: Bilateral;  . UVULOPALATOPHARYNGOPLASTY N/A 02/09/2015   Procedure: UVULOPALATOPHARYNGOPLASTY (UPPP);  Surgeon: Rozetta Nunnery, MD;  Location: [facility];  Service: ENT;  Laterality: N/A;    Social History   Tobacco Use  . Smoking status: Former Smoker    Packs/day: 2.00    Years: 30.00    Pack years: 60.00    Types: Cigarettes    Quit date: 10/29/2005    Years since quitting: 14.7  . Smokeless tobacco: Never Used  Substance Use Topics  . Alcohol use: Yes    Alcohol/week: 1.0 standard drink    Types: 1 Cans of beer per week    Comment: 1 beer every other day  . Drug use: No    Family History  Problem Relation  Age of Onset  . Coronary artery disease Mother   . Stroke Mother   . Hypertension Father   . Anxiety disorder Brother   . Skin cancer Brother   . Stroke Brother     No Known Allergies  Medication list has been reviewed and updated.  Current Outpatient Medications on File Prior to Visit  Medication Sig Dispense Refill  . albuterol (VENTOLIN HFA) 108 (90 Base) MCG/ACT inhaler Inhale 2 puffs into the lungs every 6 (six) hours as needed for wheezing or shortness of breath. 1 each 0  . benzonatate (  TESSALON) 200 MG capsule Take 1 capsule (200 mg total) by mouth 3 (three) times daily as needed for cough. 60 capsule 2  . clonazePAM (KLONOPIN) 0.5 MG tablet Take 1-2 tablets (0.5-1 mg total) by mouth at bedtime. As needed for sleep 180 tablet 1  . famotidine (PEPCID) 20 MG tablet Take once or twice a day as needed to control GERD 180 tablet 3  . fluticasone (FLONASE) 50 MCG/ACT nasal spray Place 2 sprays into both nostrils daily. 43 g 3  . fluticasone furoate-vilanterol (BREO ELLIPTA) 200-25 MCG/INH AEPB Inhale 1 puff into the lungs daily. 1 each 0  . HYDROcodone-acetaminophen (NORCO/VICODIN) 5-325 MG tablet Take 1 tablet by mouth every 8 (eight) hours as needed for moderate pain. 30 tablet 0  . indomethacin (INDOCIN) 25 MG capsule Take 1 to 2 capsules three times daily as needed for headache. 20 capsule 0  . Melatonin 5 MG TABS Take 5 mg by mouth at bedtime.    . methylphenidate (RITALIN) 20 MG tablet Take 1 tablet (20 mg total) by mouth 2 (two) times daily. 180 tablet 0  . metoprolol tartrate (LOPRESSOR) 100 MG tablet Take 1 tablet (100 mg total) by mouth once for 1 dose. Take two hours prior to your cardiac CT 1 tablet 0  . montelukast (SINGULAIR) 10 MG tablet Take 1 tablet (10 mg total) by mouth at bedtime. 90 tablet 3  . pantoprazole (PROTONIX) 40 MG tablet Take 1 tablet (40 mg total) by mouth daily. 30 tablet 3  . pregabalin (LYRICA) 100 MG capsule Take 2 capsules (200 mg total) by mouth at  bedtime. 180 capsule 3  . rosuvastatin (CRESTOR) 10 MG tablet Take 1 tablet (10 mg total) by mouth daily. 90 tablet 3  . sucralfate (CARAFATE) 1 g tablet Take 1 tablet (1 g total) by mouth 4 (four) times daily -  with meals and at bedtime. 40 tablet 0  . SUMAtriptan (IMITREX) 100 MG tablet Take 1 tablet earliest onset of migraine.  May repeat in 2 hours if headache persists or recurs.  Maximum 2 tablets in 24 hours. 10 tablet 5  . tolterodine (DETROL LA) 4 MG 24 hr capsule Take 1 capsule (4 mg total) by mouth daily. 90 capsule 3  . topiramate (TOPAMAX) 100 MG tablet Take 100 mg by mouth daily.    . traZODone (DESYREL) 150 MG tablet Take 150 mg at bedtime as needed for sleep 90 tablet 3  . vitamin B-12 (CYANOCOBALAMIN) 500 MCG tablet Take 500 mcg by mouth daily.    Marland Kitchen umeclidinium bromide (INCRUSE ELLIPTA) 62.5 MCG/INH AEPB Inhale 1 puff into the lungs daily. (Patient not taking: Reported on 07/15/2020) 30 each 1   No current facility-administered medications on file prior to visit.    Review of Systems:  As per HPI- otherwise negative.   Physical Examination: Vitals:   07/15/20 0937  BP: 118/80  Pulse: 80  Temp: 98.1 F (36.7 C)  SpO2: 99%   Vitals:   07/15/20 0937  Weight: 182 lb (82.6 kg)  Height: 6' (1.829 m)   Body mass index is 24.68 kg/m. Ideal Body Weight: Weight in (lb) to have BMI = 25: 183.9  GEN: no acute distress.  Normal weight, appears his normal self HEENT: Atraumatic, Normocephalic.  Hearing aids bilaterally, PEERL Ears and Nose: No external deformity. CV: RRR, No M/G/R. No JVD. No thrill. No extra heart sounds. PULM: CTA B, no wheezes, crackles, rhonchi. No retractions. No resp. distress. No accessory muscle use. ABD: S,  NT, ND, +BS. No rebound. No HSM. EXTR: No c/c/e PSYCH: Normally interactive. Conversant.    Assessment and Plan: Cough - Plan: predniSONE (DELTASONE) 20 MG tablet  Fatigue, unspecified type - Plan: VITAMIN D 25 Hydroxy (Vit-D Deficiency,  Fractures), TSH, B12, Comprehensive metabolic panel, Sedimentation rate, C-reactive protein, ANA  Weakness - Plan: VITAMIN D 25 Hydroxy (Vit-D Deficiency, Fractures), TSH, B12, Comprehensive metabolic panel, Sedimentation rate, C-reactive protein, ANA  Screening for diabetes mellitus - Plan: Hemoglobin A1c  Gastroesophageal reflux disease, unspecified whether esophagitis present - Plan: Ambulatory referral to Gastroenterology  Elevated glucose - Plan: Hemoglobin A1c  Paresthesia - Plan: B12  Vitamin D deficiency - Plan: VITAMIN D 25 Hydroxy (Vit-D Deficiency, Fractures)  Is here today with concern of persistent cough, intermittent shortness of breath and feeling of lightheadedness/weakness He does confide that his father died of pulmonary fibrosis, this was thought due to his work in an [employer], corporate company Cough has been present for 6 months or longer Current plan is to have an echocardiogram and then see pulmonology  We wonder if he may have had COVID-19 at some point, he has not tested positive per my knowledge but certainly this is a possibility and could lead to pulmonary fibrosis I will also touch base with his cardiologist about any utility of cardiac cath as further work-up Lab work today to look for any other possible explanation for symptoms We will treat with prednisone for 10 days, this gave him temporary relief previously Will plan further follow- up pending labs. This visit occurred during the SARS-CoV-2 public health emergency.  Safety protocols were in place, including screening questions prior to the visit, additional usage of staff PPE, and extensive cleaning of exam room while observing appropriate contact time as indicated for disinfecting solutions.     Signed Lamar Blinks, MD  Received his labs as below, message to patient  Results for orders placed or performed in visit on 07/15/20  VITAMIN D 25 Hydroxy (Vit-D Deficiency, Fractures)  Result  Value Ref Range   VITD 27.85 (L) 30.00 - 100.00 ng/mL  TSH  Result Value Ref Range   TSH 2.62 0.35 - 4.50 uIU/mL  B12  Result Value Ref Range   Vitamin B-12 264 211 - 911 pg/mL  Comprehensive metabolic panel  Result Value Ref Range   Sodium 141 135 - 145 mEq/L   Potassium 4.2 3.5 - 5.1 mEq/L   Chloride 105 96 - 112 mEq/L   CO2 32 19 - 32 mEq/L   Glucose, Bld 101 (H) 70 - 99 mg/dL   BUN 16 6 - 23 mg/dL   Creatinine, Ser 0.97 0.40 - 1.50 mg/dL   Total Bilirubin 0.9 0.2 - 1.2 mg/dL   Alkaline Phosphatase 58 39 - 117 U/L   AST 21 0 - 37 U/L   ALT 24 0 - 53 U/L   Total Protein 6.7 6.0 - 8.3 g/dL   Albumin 4.5 3.5 - 5.2 g/dL   GFR 80.65 >60.00 mL/min   Calcium 9.6 8.4 - 10.5 mg/dL  Sedimentation rate  Result Value Ref Range   Sed Rate 3 0 - 20 mm/hr  C-reactive protein  Result Value Ref Range   CRP <1.0 0.5 - 20.0 mg/dL  Hemoglobin A1c  Result Value Ref Range   Hgb A1c MFr Bld 5.4 4.6 - 6.5 %    Received ANA 5/6- also negative.  Result note to pt  Also heard back from DR Csf - [place]- proceed with echo and pulmonology eval for now,  cath not currently indicated

## 2020-07-15 NOTE — Patient Instructions (Signed)
Good to see you again today- I am sorry that you are struggling so much Let's use prednisone again for about 10 days, this may help you feel better Otherwise I think your plan to have an echo and then see pulmonology makes sense Please let me know if anything changes or gets worse I will be in touch with your labs and will ask Dr Bettina Gavia if a cardiac cath makes any sense for you Also placed GI referral for you

## 2020-07-16 ENCOUNTER — Ambulatory Visit: Payer: Medicare Other | Admitting: Family Medicine

## 2020-07-17 ENCOUNTER — Encounter: Payer: Self-pay | Admitting: Family Medicine

## 2020-07-17 LAB — ANA: Anti Nuclear Antibody (ANA): NEGATIVE

## 2020-07-22 ENCOUNTER — Ambulatory Visit: Payer: Medicare Other | Admitting: Family Medicine

## 2020-07-29 ENCOUNTER — Other Ambulatory Visit: Payer: Self-pay

## 2020-07-29 ENCOUNTER — Telehealth: Payer: Self-pay

## 2020-07-29 ENCOUNTER — Ambulatory Visit (HOSPITAL_BASED_OUTPATIENT_CLINIC_OR_DEPARTMENT_OTHER)
Admission: RE | Admit: 2020-07-29 | Discharge: 2020-07-29 | Disposition: A | Discharge status: Home / Self Care | Payer: Medicare Other | Source: Ambulatory Visit | Attending: Cardiology | Admitting: Cardiology

## 2020-07-29 DIAGNOSIS — E785 Hyperlipidemia, unspecified: Secondary | ICD-10-CM | POA: Diagnosis present

## 2020-07-29 DIAGNOSIS — R0600 Dyspnea, unspecified: Secondary | ICD-10-CM | POA: Diagnosis present

## 2020-07-29 DIAGNOSIS — I10 Essential (primary) hypertension: Secondary | ICD-10-CM | POA: Insufficient documentation

## 2020-07-29 DIAGNOSIS — R0609 Other forms of dyspnea: Secondary | ICD-10-CM

## 2020-07-29 LAB — ECHOCARDIOGRAM COMPLETE
AR max vel: 2.66 cm2
AV Area VTI: 2.64 cm2
AV Area mean vel: 2.54 cm2
AV Mean grad: 2.5 mmHg
AV Peak grad: 4.7 mmHg
Ao pk vel: 1.09 m/s
Area-P 1/2: 2.68 cm2
Calc EF: 65.6 %
S' Lateral: 2.4 cm
Single Plane A2C EF: 71.6 %
Single Plane A4C EF: 60 %

## 2020-07-29 NOTE — Telephone Encounter (Signed)
-----   Message from Patrick Priest, MD sent at 07/29/2020  1:58 PM EDT ----- Overall I think this is a good report  No findings of heart dysfunction or heart failure or pulmonary artery hypertension to account for shortness of breath  At this time I would encourage him to follow-up with pulmonary regarding his shortness of breath.

## 2020-07-29 NOTE — Telephone Encounter (Signed)
Spoke with patient regarding results and recommendation.  Patient verbalizes understanding and is agreeable to plan of care. Advised patient to call back with any issues or concerns.  

## 2020-07-31 ENCOUNTER — Ambulatory Visit (INDEPENDENT_AMBULATORY_CARE_PROVIDER_SITE_OTHER): Payer: Medicare Other | Admitting: Pulmonary Disease

## 2020-07-31 ENCOUNTER — Encounter: Payer: Self-pay | Admitting: Pulmonary Disease

## 2020-07-31 ENCOUNTER — Other Ambulatory Visit: Payer: Self-pay

## 2020-07-31 VITALS — BP 122/84 | HR 74 | Ht 72.0 in | Wt 182.0 lb

## 2020-07-31 DIAGNOSIS — R053 Chronic cough: Secondary | ICD-10-CM | POA: Diagnosis not present

## 2020-07-31 MED ORDER — BREO ELLIPTA 200-25 MCG/INH IN AEPB: 1.0000 | INHALATION_SPRAY | Freq: Every day | RESPIRATORY_TRACT | 0 refills | Status: DC

## 2020-07-31 NOTE — Progress Notes (Signed)
Subjective:   PATIENT ID: Patrick [name] GENDER: male DOB: 1951/02/10, MRN: [id number]    Chief Complaint  Patient presents with  . Cough    For 1 year  . Shortness of Breath    Reason for Visit: New consult for chronic cough  Mr. Curlee Bogan is a 70 year old former smoker with GAD, moderate OSA and acid reflux who presents for follow-up.  Synopsis:  2020- Seen once for long standing dry hacking cough that occurs several times a day. Worse in the mornings. Denies wheezing. Triggered by cleaning solutions and wipes and strong scents. Can also be triggered when exposed to dust, grass, leaves. Also reports episodes of his throat closing up associated with sensations of feeling like he cannot breath. He has been taking singulair with some relief. He tried a rescue inhaler for a few weeks but did not notice a difference. On ROS, he reports a hx of reflux described as retrosternal burning. He is otherwise fairly active. He walks two miles daily. Only has shortness of breath with heavy exertion such as running.  2022- Returned for chronic cough in May.  07/31/20 Previously seen for chronic cough. Last visit in 2020. He was seen by his PCP 07/15/2020 with Dr. Lorelei Pont for shortness of breath and cough. He was placed on prednisone taper. He has also been referred to GI.   Since being on the steroid taper, his cough is improved. Before steroids he was using albuterol 2-3 times a day.  He is unsure if Breo helped in the past. 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 Quit smoking in 2007 Father had ?ILD after working at [employer]. Exposure to chemicals, powders   Environmental exposures:  Prior home reno projects including insulation and flooring. No clear exposure to asbestos Intermittent projects with Buyer, retail with metals  I have personally reviewed patient's past medical/family/social history/allergies/current medications.  Past Medical History:  Diagnosis Date  . Anxiety   . Arthritis   . Basal cell carcinoma   . BPH (benign prostatic hyperplasia)   . Depression   . Dysphagia   . Emphysema of lung ([place])   . Fibromyalgia   . GERD (gastroesophageal reflux disease)    otc  tums  . Headache    migraines  . Herpes simplex type 1 infection   . History of migraine   . Hypertension    no longer on medication  . Insomnia   . Lower back pain   . Morton's neuroma   . OSA (obstructive sleep apnea)   . Primary snoring   . Recurrent canker sores   . Urinary frequency     No Known Allergies   Outpatient Medications Prior to Visit  Medication Sig Dispense Refill  . albuterol (VENTOLIN HFA) 108 (90 Base) MCG/ACT inhaler Inhale 2 puffs into the lungs every 6 (six) hours as needed for wheezing or shortness of breath. 1 each 0  . benzonatate (TESSALON) 200 MG capsule Take 1 capsule (200 mg total) by mouth 3 (three) times daily as needed for cough. 60 capsule 2  . clonazePAM (KLONOPIN) 0.5 MG tablet Take 1-2 tablets (0.5-1 mg total) by mouth at bedtime. As needed for sleep 180 tablet 1  . famotidine (PEPCID) 20 MG tablet Take once or twice a day  as needed to control GERD 180 tablet 3  . fluticasone (FLONASE) 50 MCG/ACT nasal spray Place 2 sprays into both nostrils daily. 43 g 3  . fluticasone furoate-vilanterol (BREO ELLIPTA) 200-25 MCG/INH AEPB Inhale 1 puff into the lungs daily. 1 each 0  . HYDROcodone-acetaminophen (NORCO/VICODIN) 5-325 MG tablet Take 1 tablet by mouth every 8 (eight) hours as needed for moderate pain. 30 tablet 0  . indomethacin (INDOCIN) 25 MG capsule Take 1 to 2 capsules three times daily as needed for headache. 20 capsule 0  . Melatonin 5 MG TABS Take 5 mg by mouth at bedtime.    . methylphenidate (RITALIN) 20 MG tablet Take 1 tablet (20 mg total)  by mouth 2 (two) times daily. 180 tablet 0  . metoprolol tartrate (LOPRESSOR) 100 MG tablet Take 1 tablet (100 mg total) by mouth once for 1 dose. Take two hours prior to your cardiac CT 1 tablet 0  . montelukast (SINGULAIR) 10 MG tablet Take 1 tablet (10 mg total) by mouth at bedtime. 90 tablet 3  . pantoprazole (PROTONIX) 40 MG tablet Take 1 tablet (40 mg total) by mouth daily. 30 tablet 3  . pregabalin (LYRICA) 100 MG capsule Take 2 capsules (200 mg total) by mouth at bedtime. 180 capsule 3  . rosuvastatin (CRESTOR) 10 MG tablet Take 1 tablet (10 mg total) by mouth daily. 90 tablet 3  . sucralfate (CARAFATE) 1 g tablet Take 1 tablet (1 g total) by mouth 4 (four) times daily -  with meals and at bedtime. 40 tablet 0  . SUMAtriptan (IMITREX) 100 MG tablet Take 1 tablet earliest onset of migraine.  May repeat in 2 hours if headache persists or recurs.  Maximum 2 tablets in 24 hours. 10 tablet 5  . tolterodine (DETROL LA) 4 MG 24 hr capsule Take 1 capsule (4 mg total) by mouth daily. 90 capsule 3  . topiramate (TOPAMAX) 100 MG tablet Take 100 mg by mouth daily.    . traZODone (DESYREL) 150 MG tablet Take 150 mg at bedtime as needed for sleep 90 tablet 3  . umeclidinium bromide (INCRUSE ELLIPTA) 62.5 MCG/INH AEPB Inhale 1 puff into the lungs daily. 30 each 1  . vitamin B-12 (CYANOCOBALAMIN) 500 MCG tablet Take 500 mcg by mouth daily.    . predniSONE (DELTASONE) 20 MG tablet Take 40 mg daily for 5 days, then 20 mg daily for 5 days 15 tablet 0   No facility-administered medications prior to visit.    Review of Systems  Constitutional: Negative for chills, diaphoresis, fever, malaise/fatigue and weight loss.  HENT: Negative for congestion.   Respiratory: Positive for cough and shortness of breath. Negative for hemoptysis, sputum production and wheezing.   Cardiovascular: Negative for chest pain, palpitations and leg swelling.     Objective:   Vitals:   07/31/20 1133  BP: 122/84  Pulse: 74   SpO2: 98%  Weight: 182 lb (82.6 kg)  Height: 6' (1.829 m)   SpO2: 98 %   Physical Exam: General: Well-appearing, no acute distress HENT: [place], AT Eyes: EOMI, no scleral icterus Respiratory: Clear to auscultation bilaterally.  No crackles, wheezing or rales Cardiovascular: RRR, -M/R/G, no JVD Extremities:-Edema,-tenderness Neuro: AAO x4, CNII-XII grossly intact Skin: Intact, no rashes or bruising Psych: Normal mood, normal affect  Data Reviewed:  Imaging: CT Chest Lung Screen 10/15/18 - Centrilobular emphysema, RLL calcified granuloma. Subpleural left base nodule, unchanged CT Chest Lung Screen 12/19/19 - Centrilobular emphysema. Unchanged subpleural left base nodule. RLL calcified  granuloma CT Coronary - Lung parenchyma with minimal RML and lingula scarring and bilateral atelectasis. Enlarged pulmonary trunk.  PFT: None on file  Labs: CBC    Component Value Date/Time   WBC 3.4 (L) 03/23/2020 1134   RBC 4.96 03/23/2020 1134   HGB 15.5 03/23/2020 1134   HCT 46.8 03/23/2020 1134   PLT 125.0 (L) 03/23/2020 1134   MCV 94.4 03/23/2020 1134   MCH 32.3 09/26/2015 1505   MCHC 33.1 03/23/2020 1134   RDW 13.6 03/23/2020 1134   LYMPHSABS 1.3 01/30/2019 1227   MONOABS 0.4 01/30/2019 1227   EOSABS 0.1 01/30/2019 1227   BASOSABS 0.0 01/30/2019 1227   BMET    Component Value Date/Time   NA 141 07/15/2020 1014   NA 143 07/03/2020 0827   K 4.2 07/15/2020 1014   CL 105 07/15/2020 1014   CO2 32 07/15/2020 1014   GLUCOSE 101 (H) 07/15/2020 1014   BUN 16 07/15/2020 1014   BUN 16 07/03/2020 0827   CREATININE 0.97 07/15/2020 1014   CALCIUM 9.6 07/15/2020 1014   GFRNONAA >60 09/26/2015 1505   GFRAA >60 09/26/2015 1505   Absolute eos 100  Sleep study: 08/17/14 Split night - AHI 15.8 08/28/14 CPAP titration - Recommend 10cm H20  Echocardiogram EF 55-60%, no WMA or valvular abnormalities  Imaging, labs and test noted above have been reviewed independently by me.     Assessment  & Plan:   Discussion: 70 year old former smoker with GAD, moderate OSA and acid reflux who presents for follow-up for chronic cough. Has been an issue for >2 years. He did not follow-up after our initial consult after trial of PPI and management of allergic rhinitis. Still needs to rule out underlying obstructive or restrictive lung disease.  Multiple causes for cough are possible including uncontrolled asthma, acid reflux and post-nasal drainage. If trial of ICS/LABA and PFTs are unrevealing, then would consider additional chest imaging and bronchoscopy.  Chronic cough, episodic  --START Breo 200-25 mcg ONE puff ONCE a day. Sample given --CONTINUE Albuterol inhaler as needed --CONTINUE singulair 10 mg daily --Will arrange for pulmonary tests to rule out obstructive and restrictive disease --Will order HR CT Chest without contrast (end July 2022) --If testing is negative, we could consider bronchoscopy for evaluation for chronic cough >2 years   Health Maintenance Immunization History  Administered Date(s) Administered  . Fluad Quad(high Dose 65+) 12/18/2019  . Influenza, High Dose Seasonal PF 12/03/2018  . Influenza,inj,Quad PF,6+ Mos 12/15/2016  . Influenza-Unspecified 01/12/2018  . Moderna SARS-COV2 Booster Vaccination 06/22/2020  . Moderna Sars-Covid-2 Vaccination 04/15/2019, 05/12/2019  . Pneumococcal Conjugate-13 05/03/2018  . Pneumococcal Polysaccharide-23 07/31/2019  . Tdap 07/20/2016  . Zoster 03/14/2013  . Zoster Recombinat (Shingrix) 07/28/2016, 10/28/2016   CT Lung Screen Due 11/2018  Orders Placed This Encounter  Procedures  . CT Chest High Resolution    Standing Status:   Future    Standing Expiration Date:   07/31/2021    Scheduling Instructions:     By the end of July    Order Specific Question:   Preferred imaging location?    Answer:   [facility]  . Pulmonary Function Test    Standing Status:   Future    Standing Expiration Date:   07/31/2021     Order Specific Question:   Where should this test be performed?    Answer:   [facility]    Order Specific Question:   Full PFT: includes the following: basic  spirometry, spirometry pre & post bronchodilator, diffusion capacity (DLCO), lung volumes    Answer:   Full PFT   Meds ordered this encounter  Medications  . fluticasone furoate-vilanterol (BREO ELLIPTA) 200-25 MCG/INH AEPB    Sig: Inhale 1 puff into the lungs daily.    Dispense:  28 each    Refill:  0    Order Specific Question:   Lot Number?    Answer:   Janus Molder    Order Specific Question:   Expiration Date?    Answer:   11/12/2020    Order Specific Question:   Manufacturer?    Answer:   [employer] [[id number]]    Order Specific Question:   Quantity    Answer:   2    Return in 2 months (on 09/30/2020).   I have spent a total time of 32-minutes on the day of the appointment reviewing prior documentation, coordinating care and discussing medical diagnosis and plan with the patient/family. Imaging, labs and tests included in this note have been reviewed and interpreted independently by me.   [place], MD [facility] 07/31/2020 11:52 AM  Office Number [phone]

## 2020-07-31 NOTE — Patient Instructions (Addendum)
Chronic Cough --START Breo 200-25 mcg ONE puff ONCE a day. Sample given --CONTINUE Albuterol inhaler as needed --CONTINUE singulair 10 mg daily --Will arrange for pulmonary tests to rule out obstructive and restrictive disease --Will order HR CT Chest without contrast  --If testing is negative, we could consider bronchoscopy for evaluation for chronic cough >2 years   Follow-up with me (end of July)

## 2020-08-04 ENCOUNTER — Encounter: Payer: Self-pay | Admitting: Family Medicine

## 2020-08-04 DIAGNOSIS — G894 Chronic pain syndrome: Secondary | ICD-10-CM

## 2020-08-04 MED ORDER — PREGABALIN 100 MG PO CAPS: 200.0000 mg | ORAL_CAPSULE | Freq: Every day | ORAL | 3 refills | Status: DC

## 2020-08-11 ENCOUNTER — Encounter: Payer: Self-pay | Admitting: Pulmonary Disease

## 2020-08-21 ENCOUNTER — Ambulatory Visit: Payer: Medicare Other | Admitting: Cardiology

## 2020-08-25 ENCOUNTER — Other Ambulatory Visit: Payer: Self-pay | Admitting: Family Medicine

## 2020-08-25 DIAGNOSIS — K219 Gastro-esophageal reflux disease without esophagitis: Secondary | ICD-10-CM

## 2020-09-24 ENCOUNTER — Other Ambulatory Visit: Payer: Self-pay

## 2020-09-24 ENCOUNTER — Ambulatory Visit (HOSPITAL_BASED_OUTPATIENT_CLINIC_OR_DEPARTMENT_OTHER)
Admission: RE | Admit: 2020-09-24 | Discharge: 2020-09-24 | Disposition: A | Discharge status: Home / Self Care | Payer: Medicare Other | Source: Ambulatory Visit | Attending: Pulmonary Disease | Admitting: Pulmonary Disease

## 2020-09-24 DIAGNOSIS — R053 Chronic cough: Secondary | ICD-10-CM | POA: Insufficient documentation

## 2020-09-25 ENCOUNTER — Other Ambulatory Visit (HOSPITAL_COMMUNITY)
Admission: RE | Admit: 2020-09-25 | Discharge: 2020-09-25 | Disposition: A | Discharge status: Home / Self Care | Payer: Medicare Other | Source: Ambulatory Visit | Attending: Pulmonary Disease | Admitting: Pulmonary Disease

## 2020-09-25 DIAGNOSIS — Z20822 Contact with and (suspected) exposure to covid-19: Secondary | ICD-10-CM | POA: Insufficient documentation

## 2020-09-25 DIAGNOSIS — Z01812 Encounter for preprocedural laboratory examination: Secondary | ICD-10-CM | POA: Insufficient documentation

## 2020-09-25 LAB — SARS CORONAVIRUS 2 (TAT 6-24 HRS): SARS Coronavirus 2: NEGATIVE

## 2020-09-27 ENCOUNTER — Other Ambulatory Visit: Payer: Self-pay | Admitting: Family Medicine

## 2020-09-27 DIAGNOSIS — F411 Generalized anxiety disorder: Secondary | ICD-10-CM

## 2020-09-27 DIAGNOSIS — I10 Essential (primary) hypertension: Secondary | ICD-10-CM

## 2020-09-28 NOTE — Telephone Encounter (Signed)
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  I did not see in the chart as well why he d/c.  Do you want pt to continue?  Requesting: clonazepam Last Visit: 07/15/20 Next Visit: none Last Refill: 03/20/20  Please Advise

## 2020-09-29 ENCOUNTER — Encounter: Payer: Self-pay | Admitting: Pulmonary Disease

## 2020-09-29 ENCOUNTER — Ambulatory Visit (INDEPENDENT_AMBULATORY_CARE_PROVIDER_SITE_OTHER): Payer: Medicare Other | Admitting: Pulmonary Disease

## 2020-09-29 ENCOUNTER — Other Ambulatory Visit: Payer: Self-pay

## 2020-09-29 VITALS — BP 110/68 | HR 72 | Ht 72.0 in | Wt 187.3 lb

## 2020-09-29 DIAGNOSIS — R918 Other nonspecific abnormal finding of lung field: Secondary | ICD-10-CM | POA: Diagnosis not present

## 2020-09-29 DIAGNOSIS — R053 Chronic cough: Secondary | ICD-10-CM | POA: Diagnosis not present

## 2020-09-29 DIAGNOSIS — J42 Unspecified chronic bronchitis: Secondary | ICD-10-CM

## 2020-09-29 DIAGNOSIS — R059 Cough, unspecified: Secondary | ICD-10-CM

## 2020-09-29 LAB — PULMONARY FUNCTION TEST
DL/VA % pred: 127 %
DL/VA: 5.19 ml/min/mmHg/L
DLCO cor % pred: 127 %
DLCO cor: 35.71 ml/min/mmHg
DLCO unc % pred: 127 %
DLCO unc: 35.71 ml/min/mmHg
FEF 25-75 Post: 4.25 L/sec
FEF 25-75 Pre: 4.15 L/sec
FEF2575-%Change-Post: 2 %
FEF2575-%Pred-Post: 151 %
FEF2575-%Pred-Pre: 147 %
FEV1-%Change-Post: 1 %
FEV1-%Pred-Post: 107 %
FEV1-%Pred-Pre: 106 %
FEV1-Post: 3.91 L
FEV1-Pre: 3.86 L
FEV1FVC-%Change-Post: 1 %
FEV1FVC-%Pred-Pre: 111 %
FEV6-%Change-Post: 0 %
FEV6-%Pred-Post: 100 %
FEV6-%Pred-Pre: 100 %
FEV6-Post: 4.64 L
FEV6-Pre: 4.66 L
FEV6FVC-%Change-Post: 0 %
FEV6FVC-%Pred-Post: 104 %
FEV6FVC-%Pred-Pre: 104 %
FVC-%Change-Post: 0 %
FVC-%Pred-Post: 95 %
FVC-%Pred-Pre: 96 %
FVC-Post: 4.67 L
FVC-Pre: 4.68 L
Post FEV1/FVC ratio: 84 %
Post FEV6/FVC ratio: 99 %
Pre FEV1/FVC ratio: 82 %
Pre FEV6/FVC Ratio: 99 %
RV % pred: 69 %
RV: 1.75 L
TLC % pred: 89 %
TLC: 6.62 L

## 2020-09-29 NOTE — Progress Notes (Signed)
PFT done today. 

## 2020-09-29 NOTE — Patient Instructions (Addendum)
Chronic cough - improved  Chronic bronchitis -  Abnormal CT --HRCT Chest without contrast in one year  Follow-up with me in one year after scan

## 2020-09-29 NOTE — Progress Notes (Addendum)
Subjective:   PATIENT ID: Patrick [name] GENDER: male DOB: [birth year], MRN: [id number]    Chief Complaint  Patient presents with   Follow-up    Follow up on PFT. Not using inhalers, states coughing is a little better.    Reason for Visit: New consult for chronic cough  Patrick [name] is a 70 year old former smoker with GAD, moderate OSA and acid reflux who presents for follow-up.  Synopsis:  2020- Seen once for long standing dry hacking cough that occurs several times a day. Worse in the mornings. Denies wheezing. Triggered by cleaning solutions and wipes and strong scents. Can also be triggered when exposed to dust, grass, leaves. Also reports episodes of his throat closing up associated with sensations of feeling like he cannot breath. He has been taking singulair with some relief. He tried a rescue inhaler for a few weeks but did not notice a difference. On ROS, he reports a hx of reflux described as retrosternal burning. He is otherwise fairly active. He walks two miles daily. Only has shortness of breath with heavy exertion such as running.  2022- Returned for chronic cough in May.  09/29/20 Since our last visit he reports his cough has nearly resolved. He had previously tried prednisone, Breo and albuterol. He was restarted on Breo on the last visit but did not have any effect. His cough has self-resolved and no longer is an issue. His work-up for his cough included CT and PFT which we will discuss today. His wife is present as well. Denies significant shortness of breath or wheezing or any other respiratory issues. He does have concern about his lung due to his family history of ILD however unclear if this was work-related vs genetic.  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 Previously smoked 2 ppd Father had ?ILD after working at [employer]. Exposure to chemicals, powders   Environmental  exposures:  Prior home reno projects including insulation and flooring. 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  Past Medical History:  Diagnosis Date   Anxiety    Arthritis    Basal cell carcinoma    BPH (benign prostatic hyperplasia)    Depression    Dysphagia    Emphysema of lung ([facility])    Fibromyalgia    GERD (gastroesophageal reflux disease)    otc  tums   Headache    migraines   Herpes simplex type 1 infection    History of migraine    Hypertension    no longer on medication   Insomnia    Lower back pain    Morton's neuroma    OSA (obstructive sleep apnea)    Primary snoring    Recurrent canker sores    Urinary frequency     No Known Allergies   Outpatient Medications Prior to Visit  Medication Sig Dispense Refill   albuterol (VENTOLIN HFA) 108 (90 Base) MCG/ACT inhaler Inhale 2 puffs into the lungs every 6 (six) hours as needed for wheezing or shortness of breath. 1 each 0   benzonatate (TESSALON) 200 MG capsule Take 1 capsule (200 mg total) by mouth 3 (three) times daily as needed for cough. 60 capsule 2   clonazePAM (KLONOPIN) 0.5 MG tablet TAKE 1 TO 2 TABLETS BY MOUTH AT BEDTIME AS NEEDED FOR SLEEP 180 tablet 0   famotidine (PEPCID) 20 MG tablet TAKE 1 TABLET BY MOUTH ONCE  TO TWICE A DAY AS NEEDED FOR TO CONTROL GERD 180 tablet 3   fluticasone (FLONASE) 50 MCG/ACT nasal spray Place 2 sprays into both nostrils daily. 43 g 3   fluticasone furoate-vilanterol (BREO ELLIPTA) 200-25 MCG/INH AEPB Inhale 1 puff into the lungs daily. 1 each 0   fluticasone furoate-vilanterol (BREO ELLIPTA) 200-25 MCG/INH AEPB Inhale 1 puff into the lungs daily. 28 each 0   HYDROcodone-acetaminophen (NORCO/VICODIN) 5-325 MG tablet Take 1 tablet by mouth every 8 (eight) hours as needed for moderate pain. 30 tablet 0   indomethacin (INDOCIN) 25 MG capsule Take 1 to 2 capsules three times daily as needed for headache. 20 capsule  0   losartan (COZAAR) 50 MG tablet TAKE ONE TABLET BY MOUTH DAILY 90 tablet 3   Melatonin 5 MG TABS Take 5 mg by mouth at bedtime.     methylphenidate (RITALIN) 20 MG tablet Take 1 tablet (20 mg total) by mouth 2 (two) times daily. 180 tablet 0   montelukast (SINGULAIR) 10 MG tablet Take 1 tablet (10 mg total) by mouth at bedtime. 90 tablet 3   pantoprazole (PROTONIX) 40 MG tablet Take 1 tablet (40 mg total) by mouth daily. 30 tablet 3   pregabalin (LYRICA) 100 MG capsule Take 2 capsules (200 mg total) by mouth at bedtime. 180 capsule 3   rosuvastatin (CRESTOR) 10 MG tablet Take 1 tablet (10 mg total) by mouth daily. 90 tablet 3   sucralfate (CARAFATE) 1 g tablet Take 1 tablet (1 g total) by mouth 4 (four) times daily -  with meals and at bedtime. 40 tablet 0   SUMAtriptan (IMITREX) 100 MG tablet Take 1 tablet earliest onset of migraine.  May repeat in 2 hours if headache persists or recurs.  Maximum 2 tablets in 24 hours. 10 tablet 5   tolterodine (DETROL LA) 4 MG 24 hr capsule Take 1 capsule (4 mg total) by mouth daily. 90 capsule 3   topiramate (TOPAMAX) 100 MG tablet Take 100 mg by mouth daily.     traZODone (DESYREL) 150 MG tablet Take 150 mg at bedtime as needed for sleep 90 tablet 3   umeclidinium bromide (INCRUSE ELLIPTA) 62.5 MCG/INH AEPB Inhale 1 puff into the lungs daily. 30 each 1   vitamin B-12 (CYANOCOBALAMIN) 500 MCG tablet Take 500 mcg by mouth daily.     metoprolol tartrate (LOPRESSOR) 100 MG tablet Take 1 tablet (100 mg total) by mouth once for 1 dose. Take two hours prior to your cardiac CT 1 tablet 0   No facility-administered medications prior to visit.    Review of Systems  Constitutional:  Negative for chills, diaphoresis, fever, malaise/fatigue and weight loss.  HENT:  Negative for congestion.   Respiratory:  Negative for cough, hemoptysis, sputum production, shortness of breath and wheezing.   Cardiovascular:  Negative for chest pain, palpitations and leg swelling.     Objective:   Vitals:   09/29/20 1051  BP: 110/68  Pulse: 72  SpO2: 97%  Weight: 187 lb 4.8 oz (85 kg)  Height: 6' (1.829 m)   SpO2: 97 %  Physical Exam: General: Well-appearing, no acute distress HENT: , AT Eyes: EOMI, no scleral icterus Respiratory: Clear to auscultation bilaterally.  No crackles, wheezing or rales Cardiovascular: RRR, -M/R/G, no JVD Extremities:-Edema,-tenderness Neuro: AAO x4, CNII-XII grossly intact Psych: Normal mood, normal affect  Data Reviewed:  Imaging: CT Chest Lung Screen 10/15/18 - Centrilobular emphysema, RLL calcified granuloma. Subpleural left base nodule, unchanged CT Chest Lung Screen  12/19/19 - Centrilobular emphysema. Unchanged subpleural left base nodule. RLL calcified granuloma CT Coronary - Lung parenchyma with minimal RML and lingula scarring and bilateral atelectasis. Enlarged pulmonary trunk. CT 09/24/20 - Mild patchy ground glass and septal thickening in the lung bases with LLL scarring. No honeycombing or traction bronchiectasis. 44 subpleural nodule in left lower lobe.   PFT: 09/29/20 FVC 4.67 (95%) FEV1 3.91 (107%) Ratio 82  TLC 89% DLCO 127% Interpretation: Normal spirometry  Labs: CBC    Component Value Date/Time   WBC 3.4 (L) 03/23/2020 1134   RBC 4.96 03/23/2020 1134   HGB 15.5 03/23/2020 1134   HCT 46.8 03/23/2020 1134   PLT 125.0 (L) 03/23/2020 1134   MCV 94.4 03/23/2020 1134   MCH 32.3 09/26/2015 1505   MCHC 33.1 03/23/2020 1134   RDW 13.6 03/23/2020 1134   LYMPHSABS 1.3 01/30/2019 1227   MONOABS 0.4 01/30/2019 1227   EOSABS 0.1 01/30/2019 1227   BASOSABS 0.0 01/30/2019 1227   BMET    Component Value Date/Time   NA 141 07/15/2020 1014   NA 143 07/03/2020 0827   K 4.2 07/15/2020 1014   CL 105 07/15/2020 1014   CO2 32 07/15/2020 1014   GLUCOSE 101 (H) 07/15/2020 1014   BUN 16 07/15/2020 1014   BUN 16 07/03/2020 0827   CREATININE 0.97 07/15/2020 1014   CALCIUM 9.6 07/15/2020 1014   GFRNONAA >60  09/26/2015 1505   GFRAA >60 09/26/2015 1505   Absolute eos 100  Sleep study: 08/17/14 Split night - AHI 15.8 08/28/14 CPAP titration - Recommend 10cm H20  Echocardiogram EF 55-60%, no WMA or valvular abnormalities  Imaging, labs and test noted above have been reviewed independently by me.     Assessment & Plan:   Discussion: 70 year old former smoker with GAD, moderate OSA and acid reflux who presents for follow-up. Initially presented for chronic cough resulting in work-up with CT and PFTs. Cough has now resolved.   We discussed and reviewed his CT Chest in detail. His pulmonary function test was also reviewed and demonstrates normal lung function. At this time his chest imaging is indeterminate for interstitial lung disease and in the absence of restrictive defect or reduced DLCO, further testing would in be inconclusive. Will plan to continue to monitor lungs with imaging and if he develops worsening or recurrent respiratory symptoms, can consider repeating PFT as well.  Chronic cough - improved  Chronic bronchitis -  Abnormal CT --HRCT Chest without contrast in one year   Health Maintenance Immunization History  Administered Date(s) Administered   Fluad Quad(high Dose 65+) 12/18/2019   Influenza, High Dose Seasonal PF 12/03/2018   Influenza,inj,Quad PF,6+ Mos 12/15/2016   Influenza-Unspecified 01/12/2018   Moderna SARS-COV2 Booster Vaccination 06/22/2020   Moderna Sars-Covid-2 Vaccination 04/15/2019, 05/12/2019   Pneumococcal Conjugate-13 05/03/2018   Pneumococcal Polysaccharide-23 07/31/2019   Tdap 07/20/2016   Zoster Recombinat (Shingrix) 07/28/2016, 10/28/2016   Zoster, Live 03/14/2013   CT Lung Screen - not qualified. >15 since quit smoking  Orders Placed This Encounter  Procedures   CT Chest High Resolution    Standing Status:   Future    Standing Expiration Date:   09/29/2021    Order Specific Question:   Preferred imaging location?    Answer:   [place]   No orders of the defined types were placed in this encounter.   Return in about 1 year (around 09/29/2021).   I have spent a total time of  33-minutes on the day of the appointment reviewing prior documentation, coordinating care and discussing medical diagnosis and plan with the patient/family. Past medical history, allergies, medications were reviewed. Pertinent imaging, labs and tests included in this note have been reviewed and interpreted independently by me.  [place], MD [facility] 09/29/2020 11:25 AM  Office Number [phone]

## 2020-10-29 ENCOUNTER — Encounter: Payer: Self-pay | Admitting: Pulmonary Disease

## 2020-10-29 ENCOUNTER — Ambulatory Visit: Payer: Medicare Other | Admitting: Neurology

## 2020-10-29 DIAGNOSIS — R918 Other nonspecific abnormal finding of lung field: Secondary | ICD-10-CM | POA: Insufficient documentation

## 2020-10-29 DIAGNOSIS — J42 Unspecified chronic bronchitis: Secondary | ICD-10-CM | POA: Insufficient documentation

## 2020-11-18 ENCOUNTER — Other Ambulatory Visit: Payer: Self-pay | Admitting: Family Medicine

## 2020-11-18 DIAGNOSIS — G4453 Primary thunderclap headache: Secondary | ICD-10-CM

## 2020-11-19 ENCOUNTER — Other Ambulatory Visit: Payer: Self-pay | Admitting: Family Medicine

## 2020-11-19 DIAGNOSIS — F5101 Primary insomnia: Secondary | ICD-10-CM

## 2020-11-19 MED ORDER — TOPIRAMATE 100 MG PO TABS: 100.0000 mg | ORAL_TABLET | Freq: Every day | ORAL | 1 refills | Status: DC

## 2020-11-26 ENCOUNTER — Other Ambulatory Visit: Payer: Self-pay | Admitting: Family Medicine

## 2020-12-06 NOTE — Progress Notes (Signed)
Subjective:   Patrick [name] is a 70 y.o. male who presents for an Initial Medicare Annual Wellness Visit.  I connected with Patrick [name] today by telephone and verified that I am speaking with the correct person using two identifiers. Location patient: home Location provider: work Persons participating in the virtual visit: patient, Marine scientist.    I discussed the limitations, risks, security and privacy concerns of performing an evaluation and management service by telephone and the availability of in person appointments. I also discussed with the patient that there may be a patient responsible charge related to this service. The patient expressed understanding and verbally consented to this telephonic visit.    Interactive audio and video telecommunications were attempted between this provider and patient, however failed, due to patient having technical difficulties OR patient did not have access to video capability.  We continued and completed visit with audio only.  Some vital signs may be absent or patient reported.   Time Spent with patient on telephone encounter: 20 minutes   Review of Systems     Cardiac Risk Factors include: advanced age (>28men, >7 women);male gender;hypertension     Objective:    Today's Vitals   12/07/20 0741 12/07/20 0744  Weight: 183 lb (83 kg)   Height: 6' (1.829 m)   PainSc:  4    Body mass index is 24.82 kg/m.  Advanced Directives 12/07/2020 04/23/2020 09/26/2015 02/09/2015 02/03/2015  Does Patient Have a Medical Advance Directive? Yes No No No No  Type of Paramedic of [place];Living will - - - -  Copy of [place] in Chart? No - copy requested - - - -  Would patient like information on creating a medical advance directive? - - No - patient declined information No - patient declined information Yes - Educational materials given    Current Medications (verified) Outpatient Encounter Medications as of  12/07/2020  Medication Sig   albuterol (VENTOLIN HFA) 108 (90 Base) MCG/ACT inhaler Inhale 2 puffs into the lungs every 6 (six) hours as needed for wheezing or shortness of breath.   benzonatate (TESSALON) 200 MG capsule Take 1 capsule (200 mg total) by mouth 3 (three) times daily as needed for cough.   clonazePAM (KLONOPIN) 0.5 MG tablet TAKE 1 TO 2 TABLETS BY MOUTH AT BEDTIME AS NEEDED FOR SLEEP   famotidine (PEPCID) 20 MG tablet TAKE 1 TABLET BY MOUTH ONCE TO TWICE A DAY AS NEEDED FOR TO CONTROL GERD   fluticasone (FLONASE) 50 MCG/ACT nasal spray Place 2 sprays into both nostrils daily.   fluticasone furoate-vilanterol (BREO ELLIPTA) 200-25 MCG/INH AEPB Inhale 1 puff into the lungs daily.   fluticasone furoate-vilanterol (BREO ELLIPTA) 200-25 MCG/INH AEPB Inhale 1 puff into the lungs daily.   HYDROcodone-acetaminophen (NORCO/VICODIN) 5-325 MG tablet Take 1 tablet by mouth every 8 (eight) hours as needed for moderate pain.   indomethacin (INDOCIN) 25 MG capsule Take 1 to 2 capsules three times daily as needed for headache.   losartan (COZAAR) 50 MG tablet TAKE ONE TABLET BY MOUTH DAILY   Melatonin 5 MG TABS Take 5 mg by mouth at bedtime.   methylphenidate (RITALIN) 20 MG tablet Take 1 tablet (20 mg total) by mouth 2 (two) times daily.   montelukast (SINGULAIR) 10 MG tablet Take 1 tablet (10 mg total) by mouth at bedtime.   pantoprazole (PROTONIX) 40 MG tablet TAKE ONE TABLET BY MOUTH DAILY   pregabalin (LYRICA) 100 MG capsule Take 2 capsules (200 mg  total) by mouth at bedtime.   rosuvastatin (CRESTOR) 10 MG tablet Take 1 tablet (10 mg total) by mouth daily.   sucralfate (CARAFATE) 1 g tablet Take 1 tablet (1 g total) by mouth 4 (four) times daily -  with meals and at bedtime.   SUMAtriptan (IMITREX) 100 MG tablet Take 1 tablet earliest onset of migraine.  May repeat in 2 hours if headache persists or recurs.  Maximum 2 tablets in 24 hours.   tolterodine (DETROL LA) 4 MG 24 hr capsule Take 1  capsule (4 mg total) by mouth daily.   topiramate (TOPAMAX) 100 MG tablet Take 1 tablet (100 mg total) by mouth daily.   traZODone (DESYREL) 150 MG tablet TAKE ONE TABLET BY MOUTH AT BEDTIME AS NEEDED FOR SLEEP   umeclidinium bromide (INCRUSE ELLIPTA) 62.5 MCG/INH AEPB Inhale 1 puff into the lungs daily.   vitamin B-12 (CYANOCOBALAMIN) 500 MCG tablet Take 500 mcg by mouth daily.   metoprolol tartrate (LOPRESSOR) 100 MG tablet Take 1 tablet (100 mg total) by mouth once for 1 dose. Take two hours prior to your cardiac CT   No facility-administered encounter medications on file as of 12/07/2020.    Allergies (verified) Patient has no known allergies.   History: Past Medical History:  Diagnosis Date   Anxiety    Arthritis    Basal cell carcinoma    BPH (benign prostatic hyperplasia)    Depression    Dysphagia    Emphysema of lung (HCC)    Fibromyalgia    GERD (gastroesophageal reflux disease)    otc  tums   Headache    migraines   Herpes simplex type 1 infection    History of migraine    Hypertension    no longer on medication   Insomnia    Lower back pain    Morton's neuroma    OSA (obstructive sleep apnea)    Primary snoring    Recurrent canker sores    Urinary frequency    Past Surgical History:  Procedure Laterality Date   EPIDIDYMECTOMY     for spermatocele   EYE SURGERY Bilateral    lasik eye surgery   FOOT SURGERY Left    INGUINAL HERNIA REPAIR Left    01/25/1999   MOUTH SURGERY     pancreatic surgery r/t trauma     SEPTOPLASTY N/A 02/09/2015   Procedure: SEPTOPLASTY;  Surgeon: Rozetta Nunnery, MD;  Location: [place];  Service: ENT;  Laterality: N/A;   SKIN CANCER EXCISION     TONSILLECTOMY     TRANSURETHRAL INCISION OF PROSTATE     TURBINATE REDUCTION Bilateral 02/09/2015   Procedure: BILATERAL TURBINATE REDUCTION;  Surgeon: Rozetta Nunnery, MD;  Location: [place];  Service: ENT;  Laterality: Bilateral;   UVULOPALATOPHARYNGOPLASTY N/A 02/09/2015    Procedure: UVULOPALATOPHARYNGOPLASTY (UPPP);  Surgeon: Rozetta Nunnery, MD;  Location: [facility];  Service: ENT;  Laterality: N/A;   Family History  Problem Relation Age of Onset   Coronary artery disease Mother    Stroke Mother    Hypertension Father    Anxiety disorder Brother    Skin cancer Brother    Stroke Brother    Social History   Socioeconomic History   Marital status: Married    Spouse name: Not on file   Number of children: 2   Years of education: BS    Highest education level: Not on file  Occupational History   Occupation: Chief Financial Officer  Tobacco Use   Smoking  status: Former    Packs/day: 2.00    Years: 30.00    Pack years: 60.00    Types: Cigarettes, Pipe, Cigars    Start date: 03/1967    Quit date: 10/29/2005    Years since quitting: 15.1   Smokeless tobacco: Never  Substance and Sexual Activity   Alcohol use: Yes    Alcohol/week: 1.0 standard drink    Types: 1 Cans of beer per week    Comment: 1 beer every other day   Drug use: No   Sexual activity: Not on file  Other Topics Concern   Not on file  Social History Narrative   Denies caffeine use    Right handed   Social Determinants of Health   Financial Resource Strain: Low Risk    Difficulty of Paying Living Expenses: Not hard at all  Food Insecurity: No Food Insecurity   Worried About Charity fundraiser in the Last Year: Never true   Ran Out of Food in the Last Year: Never true  Transportation Needs: No Transportation Needs   Lack of Transportation (Medical): No   Lack of Transportation (Non-Medical): No  Physical Activity: Sufficiently Active   Days of Exercise per Week: 7 days   Minutes of Exercise per Session: 30 min  Stress: No Stress Concern Present   Feeling of Stress : Not at all  Social Connections: Socially Integrated   Frequency of Communication with Friends and Family: More than three times a week   Frequency of Social Gatherings with Friends and Family: More than three times a week    Attends Religious Services: 1 to 4 times per year   Active Member of [employer] or Organizations: Yes   Attends Archivist Meetings: 1 to 4 times per year   Marital Status: Married    Tobacco Counseling Counseling given: Not Answered   Clinical Intake:  Pre-visit preparation completed: Yes  Pain : 0-10 Pain Score: 4  Pain Type: Acute pain Pain Location: Knee (and toe) Pain Orientation: Left Pain Onset: 1 to 4 weeks ago Pain Frequency: Constant     BMI - recorded: 24.82 Nutritional Status: BMI of 19-24  Normal Diabetes: No  How often do you need to have someone help you when you read instructions, pamphlets, or other written materials from your doctor or pharmacy?: 1 - Never  Diabetic?No  Interpreter Needed?: No  Information entered by :: Caroleen Hamman LPN   Activities of Daily Living In your present state of health, do you have any difficulty performing the following activities: 12/07/2020  Hearing? N  Vision? N  Difficulty concentrating or making decisions? N  Walking or climbing stairs? N  Dressing or bathing? N  Doing errands, shopping? N  Preparing Food and eating ? N  Using the Toilet? N  In the past six months, have you accidently leaked urine? N  Do you have problems with loss of bowel control? N  Managing your Medications? N  Managing your Finances? N  Housekeeping or managing your Housekeeping? N  Some recent data might be hidden    Patient Care Team: Copland, Gay Filler, MD as PCP - General (Family Medicine) Rozetta Nunnery, MD as Consulting Physician (Otolaryngology) Charlotte Sanes, PhD [facility]) Norma Fredrickson, MD as Consulting Physician (Psychiatry) Ripley Fraise, MD as Consulting Physician (Dermatology)  Indicate any recent Medical Services you may have received from other than [facility] providers in the past year (date may be approximate).     Assessment:  This is a routine wellness examination for  [place].  Hearing/Vision screen Hearing Screening - Comments:: Bilateral hearing aids Vision Screening - Comments:: Wears contacts Last eye exam-2022-Dr. Sharen Counter  Dietary issues and exercise activities discussed: Current Exercise Habits: Home exercise routine, Type of exercise: walking, Time (Minutes): 30, Frequency (Times/Week): 7, Weekly Exercise (Minutes/Week): 210, Intensity: Mild, Exercise limited by: None identified   Goals Addressed             This Visit's Progress    Patient Stated       Increase activity       Depression Screen PHQ 2/9 Scores 12/07/2020 10/09/2017 03/23/2016  PHQ - 2 Score 0 2 0  PHQ- 9 Score - 5 -    Fall Risk Fall Risk  12/07/2020 04/23/2020 03/23/2016  Falls in the past year? 0 0 Yes  Number falls in past yr: 0 0 1  Injury with Fall? 0 0 No  Follow up Falls prevention discussed - -    FALL RISK PREVENTION PERTAINING TO THE HOME:  Any stairs in or around the home? Yes  If so, are there any without handrails? No  Home free of loose throw rugs in walkways, pet beds, electrical cords, etc? Yes  Adequate lighting in your home to reduce risk of falls? Yes   ASSISTIVE DEVICES UTILIZED TO PREVENT FALLS:  Life alert? No  Use of a cane, walker or w/c? No  Grab bars in the bathroom? Yes  Shower chair or bench in shower? No  Elevated toilet seat or a handicapped toilet? No   TIMED UP AND GO:  Was the test performed? No . Phone visit   Cognitive Function:Normal cognitive status assessed by this Nurse Health Advisor. No abnormalities found.          Immunizations Immunization History  Administered Date(s) Administered   Fluad Quad(high Dose 65+) 12/18/2019   Influenza, High Dose Seasonal PF 12/03/2018   Influenza,inj,Quad PF,6+ Mos 12/15/2016   Influenza-Unspecified 01/12/2018   Moderna SARS-COV2 Booster Vaccination 06/22/2020   Moderna Sars-Covid-2 Vaccination 04/15/2019, 05/12/2019   Pneumococcal Conjugate-13 05/03/2018   Pneumococcal  Polysaccharide-23 07/31/2019   Tdap 07/20/2016   Zoster Recombinat (Shingrix) 07/28/2016, 10/28/2016   Zoster, Live 03/14/2013    TDAP status: Up to date  Flu Vaccine status: Due, Education has been provided regarding the importance of this vaccine. Advised may receive this vaccine at local pharmacy or Health Dept. Aware to provide a copy of the vaccination record if obtained from local pharmacy or Health Dept. Verbalized acceptance and understanding.  Pneumococcal vaccine status: Up to date  Covid-19 vaccine status: Information provided on how to obtain vaccines. Booster due  Qualifies for Shingles Vaccine? No   Zostavax completed Yes   Shingrix Completed?: Yes  Screening Tests Health Maintenance  Topic Date Due   COVID-19 Vaccine (4 - Booster for Moderna series) 09/14/2020   INFLUENZA VACCINE  10/12/2020   COLONOSCOPY (Pts 45-40yrs Insurance coverage will need to be confirmed)  06/12/2025   TETANUS/TDAP  07/21/2026   Hepatitis C Screening  Completed   Zoster Vaccines- Shingrix  Completed   HPV VACCINES  Aged Out    Health Maintenance  Health Maintenance Due  Topic Date Due   COVID-19 Vaccine (4 - Booster for Moderna series) 09/14/2020   INFLUENZA VACCINE  10/12/2020    Colorectal cancer screening: Type of screening: Colonoscopy. Completed 06/13/2015. Repeat every 10 years  Lung Cancer Screening: (Low Dose CT Chest recommended if Age 38-80 years, 30 pack-year currently smoking  OR have quit w/in 15years.) does not qualify.    Additional Screening:  Hepatitis C Screening: Completed 06/13/2015  Vision Screening: Recommended annual ophthalmology exams for early detection of glaucoma and other disorders of the eye. Is the patient up to date with their annual eye exam?  Yes  Who is the provider or what is the name of the office in which the patient attends annual eye exams? Dr. Sharen Counter   Dental Screening: Recommended annual dental exams for proper oral hygiene  Community  Resource Referral / Chronic Care Management: CRR required this visit?  No   CCM required this visit?  No      Plan:     I have personally reviewed and noted the following in the patient's chart:   Medical and social history Use of alcohol, tobacco or illicit drugs  Current medications and supplements including opioid prescriptions. Patient is not currently taking opioid prescriptions. Functional ability and status Nutritional status Physical activity Advanced directives List of other physicians Hospitalizations, surgeries, and ER visits in previous 12 months Vitals Screenings to include cognitive, depression, and falls Referrals and appointments  In addition, I have reviewed and discussed with patient certain preventive protocols, quality metrics, and best practice recommendations. A written personalized care plan for preventive services as well as general preventive health recommendations were provided to patient.   Due to this being a telephonic visit, the after visit summary with patients personalized plan was offered to patient via mail or my-chart. Patient would like to access on my-chart.   Marta Antu, LPN   [id number]  Nurse Health Advisor  Nurse Notes: None

## 2020-12-07 ENCOUNTER — Other Ambulatory Visit (HOSPITAL_BASED_OUTPATIENT_CLINIC_OR_DEPARTMENT_OTHER): Payer: Self-pay

## 2020-12-07 ENCOUNTER — Ambulatory Visit: Payer: Medicare Other | Attending: Internal Medicine

## 2020-12-07 ENCOUNTER — Ambulatory Visit (INDEPENDENT_AMBULATORY_CARE_PROVIDER_SITE_OTHER): Payer: Medicare Other

## 2020-12-07 VITALS — Ht 72.0 in | Wt 183.0 lb

## 2020-12-07 DIAGNOSIS — Z0000 Encounter for general adult medical examination without abnormal findings: Secondary | ICD-10-CM

## 2020-12-07 DIAGNOSIS — Z23 Encounter for immunization: Secondary | ICD-10-CM

## 2020-12-07 MED ORDER — INFLUENZA VAC A&B SA ADJ QUAD 0.5 ML IM PRSY
PREFILLED_SYRINGE | INTRAMUSCULAR | 0 refills | Status: DC
  Filled 2020-12-07: qty 0.5, 1d supply, fill #0

## 2020-12-07 NOTE — Progress Notes (Signed)
   Covid-19 Vaccination Clinic  Name:  Patrick [name]    MRN: [id number] DOB: [birth year]  12/07/2020  [name] was observed post Covid-19 immunization for 15 minutes without incident. He was provided with Vaccine Information Sheet and instruction to access the V-Safe system.   [name] was instructed to call 911 with any severe reactions post vaccine: Difficulty breathing  Swelling of face and throat  A fast heartbeat  A bad rash all over body  Dizziness and weakness

## 2020-12-07 NOTE — Patient Instructions (Signed)
Patrick [name] , Thank you for taking time to complete your Medicare Wellness Visit. I appreciate your ongoing commitment to your health goals. Please review the following plan we discussed and let me know if I can assist you in the future.   Screening recommendations/referrals: Colonoscopy: Completed 06/13/2015-Due 06/12/2025 Recommended yearly ophthalmology/optometry visit for glaucoma screening and checkup Recommended yearly dental visit for hygiene and checkup  Vaccinations: Influenza vaccine: Due-May obtain vaccine at our office or your local pharmacy. Pneumococcal vaccine: Up to date Tdap vaccine: Up to date-Due 07/21/2026 Shingles vaccine: Completed vaccines   Covid-19: Booster available at the pharmacy.  Advanced directives: Please bring a copy for your chart  Conditions/risks identified: See problem list  Next appointment: Follow up in one year for your annual wellness visit. 12/09/2021 @ 8:20  Preventive Care 65 Years and Older, Male Preventive care refers to lifestyle choices and visits with your health care provider that can promote health and wellness. What does preventive care include? A yearly physical exam. This is also called an annual well check. Dental exams once or twice a year. Routine eye exams. Ask your health care provider how often you should have your eyes checked. Personal lifestyle choices, including: Daily care of your teeth and gums. Regular physical activity. Eating a healthy diet. Avoiding tobacco and drug use. Limiting alcohol use. Practicing safe sex. Taking low doses of aspirin every day. Taking vitamin and mineral supplements as recommended by your health care provider. What happens during an annual well check? The services and screenings done by your health care provider during your annual well check will depend on your age, overall health, lifestyle risk factors, and family history of disease. Counseling  Your health care provider may ask you  questions about your: Alcohol use. Tobacco use. Drug use. Emotional well-being. Home and relationship well-being. Sexual activity. Eating habits. History of falls. Memory and ability to understand (cognition). Work and work Statistician. Screening  You may have the following tests or measurements: Height, weight, and BMI. Blood pressure. Lipid and cholesterol levels. These may be checked every 5 years, or more frequently if you are over 17 years old. Skin check. Lung cancer screening. You may have this screening every year starting at age 28 if you have a 30-pack-year history of smoking and currently smoke or have quit within the past 15 years. Fecal occult blood test (FOBT) of the stool. You may have this test every year starting at age 50. Flexible sigmoidoscopy or colonoscopy. You may have a sigmoidoscopy every 5 years or a colonoscopy every 10 years starting at age 74. Prostate cancer screening. Recommendations will vary depending on your family history and other risks. Hepatitis C blood test. Hepatitis B blood test. Sexually transmitted disease (STD) testing. Diabetes screening. This is done by checking your blood sugar (glucose) after you have not eaten for a while (fasting). You may have this done every 1-3 years. Abdominal aortic aneurysm (AAA) screening. You may need this if you are a current or former smoker. Osteoporosis. You may be screened starting at age 53 if you are at high risk. Talk with your health care provider about your test results, treatment options, and if necessary, the need for more tests. Vaccines  Your health care provider may recommend certain vaccines, such as: Influenza vaccine. This is recommended every year. Tetanus, diphtheria, and acellular pertussis (Tdap, Td) vaccine. You may need a Td booster every 10 years. Zoster vaccine. You may need this after age 68. Pneumococcal 13-valent conjugate (PCV13) vaccine.  One dose is recommended after age  54. Pneumococcal polysaccharide (PPSV23) vaccine. One dose is recommended after age 80. Talk to your health care provider about which screenings and vaccines you need and how often you need them. This information is not intended to replace advice given to you by your health care provider. Make sure you discuss any questions you have with your health care provider. Document Released: 03/27/2015 Document Revised: 11/18/2015 Document Reviewed: 12/30/2014 Elsevier Interactive Patient Education  2017 [place] Prevention in the Home Falls can cause injuries. They can happen to people of all ages. There are many things you can do to make your home safe and to help prevent falls. What can I do on the outside of my home? Regularly fix the edges of walkways and driveways and fix any cracks. Remove anything that might make you trip as you walk through a door, such as a raised step or threshold. Trim any bushes or trees on the path to your home. Use bright outdoor lighting. Clear any walking paths of anything that might make someone trip, such as rocks or tools. Regularly check to see if handrails are loose or broken. Make sure that both sides of any steps have handrails. Any raised decks and porches should have guardrails on the edges. Have any leaves, snow, or ice cleared regularly. Use sand or salt on walking paths during winter. Clean up any spills in your garage right away. This includes oil or grease spills. What can I do in the bathroom? Use night lights. Install grab bars by the toilet and in the tub and shower. Do not use towel bars as grab bars. Use non-skid mats or decals in the tub or shower. If you need to sit down in the shower, use a plastic, non-slip stool. Keep the floor dry. Clean up any water that spills on the floor as soon as it happens. Remove soap buildup in the tub or shower regularly. Attach bath mats securely with double-sided non-slip rug tape. Do not have throw  rugs and other things on the floor that can make you trip. What can I do in the bedroom? Use night lights. Make sure that you have a light by your bed that is easy to reach. Do not use any sheets or blankets that are too big for your bed. They should not hang down onto the floor. Have a firm chair that has side arms. You can use this for support while you get dressed. Do not have throw rugs and other things on the floor that can make you trip. What can I do in the kitchen? Clean up any spills right away. Avoid walking on wet floors. Keep items that you use a lot in easy-to-reach places. If you need to reach something above you, use a strong step stool that has a grab bar. Keep electrical cords out of the way. Do not use floor polish or wax that makes floors slippery. If you must use wax, use non-skid floor wax. Do not have throw rugs and other things on the floor that can make you trip. What can I do with my stairs? Do not leave any items on the stairs. Make sure that there are handrails on both sides of the stairs and use them. Fix handrails that are broken or loose. Make sure that handrails are as long as the stairways. Check any carpeting to make sure that it is firmly attached to the stairs. Fix any carpet that is loose or  worn. Avoid having throw rugs at the top or bottom of the stairs. If you do have throw rugs, attach them to the floor with carpet tape. Make sure that you have a light switch at the top of the stairs and the bottom of the stairs. If you do not have them, ask someone to add them for you. What else can I do to help prevent falls? Wear shoes that: Do not have high heels. Have rubber bottoms. Are comfortable and fit you well. Are closed at the toe. Do not wear sandals. If you use a stepladder: Make sure that it is fully opened. Do not climb a closed stepladder. Make sure that both sides of the stepladder are locked into place. Ask someone to hold it for you, if  possible. Clearly mark and make sure that you can see: Any grab bars or handrails. First and last steps. Where the edge of each step is. Use tools that help you move around (mobility aids) if they are needed. These include: Canes. Walkers. Scooters. Crutches. Turn on the lights when you go into a dark area. Replace any light bulbs as soon as they burn out. Set up your furniture so you have a clear path. Avoid moving your furniture around. If any of your floors are uneven, fix them. If there are any pets around you, be aware of where they are. Review your medicines with your doctor. Some medicines can make you feel dizzy. This can increase your chance of falling. Ask your doctor what other things that you can do to help prevent falls. This information is not intended to replace advice given to you by your health care provider. Make sure you discuss any questions you have with your health care provider. Document Released: 12/25/2008 Document Revised: 08/06/2015 Document Reviewed: 04/04/2014 Elsevier Interactive Patient Education  2017 [employer].

## 2020-12-14 ENCOUNTER — Other Ambulatory Visit (HOSPITAL_BASED_OUTPATIENT_CLINIC_OR_DEPARTMENT_OTHER): Payer: Self-pay

## 2020-12-14 MED ORDER — COVID-19MRNA BIVAL VACC PFIZER 30 MCG/0.3ML IM SUSP
INTRAMUSCULAR | 0 refills | Status: DC
  Filled 2020-12-14: qty 0.3, 1d supply, fill #0

## 2020-12-16 ENCOUNTER — Other Ambulatory Visit: Payer: Self-pay | Admitting: Family Medicine

## 2020-12-16 DIAGNOSIS — F411 Generalized anxiety disorder: Secondary | ICD-10-CM

## 2020-12-22 ENCOUNTER — Encounter: Payer: Self-pay | Admitting: Family Medicine

## 2020-12-22 DIAGNOSIS — M47816 Spondylosis without myelopathy or radiculopathy, lumbar region: Secondary | ICD-10-CM

## 2020-12-22 DIAGNOSIS — R5382 Chronic fatigue, unspecified: Secondary | ICD-10-CM

## 2020-12-22 DIAGNOSIS — M5430 Sciatica, unspecified side: Secondary | ICD-10-CM

## 2020-12-22 MED ORDER — METHYLPHENIDATE HCL 20 MG PO TABS: 20.0000 mg | ORAL_TABLET | Freq: Two times a day (BID) | ORAL | 0 refills | Status: DC

## 2020-12-22 MED ORDER — HYDROCODONE-ACETAMINOPHEN 5-325 MG PO TABS: 1.0000 | ORAL_TABLET | Freq: Three times a day (TID) | ORAL | 0 refills | Status: DC | PRN

## 2020-12-22 NOTE — Addendum Note (Signed)
Addended by: Lamar Blinks C on: 12/22/2020 01:19 PM   Modules accepted: Orders

## 2021-01-04 ENCOUNTER — Other Ambulatory Visit: Payer: Self-pay | Admitting: Family Medicine

## 2021-01-30 ENCOUNTER — Other Ambulatory Visit: Payer: Self-pay | Admitting: Family Medicine

## 2021-02-01 ENCOUNTER — Encounter: Payer: Self-pay | Admitting: Family Medicine

## 2021-02-01 DIAGNOSIS — M25569 Pain in unspecified knee: Secondary | ICD-10-CM

## 2021-02-01 DIAGNOSIS — G8929 Other chronic pain: Secondary | ICD-10-CM

## 2021-02-10 ENCOUNTER — Other Ambulatory Visit: Payer: Self-pay | Admitting: Family Medicine

## 2021-02-10 DIAGNOSIS — F5101 Primary insomnia: Secondary | ICD-10-CM

## 2021-02-23 ENCOUNTER — Encounter: Payer: Self-pay | Admitting: Family Medicine

## 2021-03-01 ENCOUNTER — Other Ambulatory Visit: Payer: Self-pay | Admitting: Family Medicine

## 2021-03-09 ENCOUNTER — Encounter: Payer: Self-pay | Admitting: Family Medicine

## 2021-03-11 MED ORDER — SILDENAFIL CITRATE 100 MG PO TABS: 50.0000 mg | ORAL_TABLET | Freq: Every day | ORAL | 11 refills | Status: DC | PRN

## 2021-03-11 NOTE — Addendum Note (Signed)
Addended by: Lamar Blinks C on: 03/11/2021 12:09 PM   Modules accepted: Orders

## 2021-03-15 ENCOUNTER — Other Ambulatory Visit: Payer: Self-pay | Admitting: Family Medicine

## 2021-03-15 DIAGNOSIS — F5101 Primary insomnia: Secondary | ICD-10-CM

## 2021-03-15 NOTE — Telephone Encounter (Signed)
Patient is requesting a refill of the following medications: Requested Prescriptions   Pending Prescriptions Disp Refills   traZODone (DESYREL) 150 MG tablet [Pharmacy Med Name: traZODone 150 MG TABLET] 30 tablet 0    Sig: TAKE ONE TABLET BY MOUTH EVERY NIGHT AT BEDTIME AS NEEDED FOR SLEEP **MUST CALL MD FOR APPOINTMENT    Date of patient request: 03/15/21 Last office visit: 07/15/20 Date of last refill: 02/10/21 Last refill amount: 30 Follow up time period per chart: 03/25/21

## 2021-03-23 NOTE — Progress Notes (Addendum)
[facility] [place], [place], [place] [address] [phone]  Date:  03/25/2021   Name:  Patrick [name]   DOB:  17-Jul-1950   MRN:  [id number] PCP:  Darreld Mclean, MD    Chief Complaint: Follow-up (-pt says he was seen by Urology for the Groin Pain)   History of Present Illness:  Patrick [name] is a 71 y.o. very pleasant male patient who presents with the following:  Pt is seen today with concern of medication follow-up/controlled medication review visit Last seen by myself in May - history of sleep apnea, HTN, headache, GERD, BPH He saw urology for groin pain- he was given abx and is getting better  He is using hydrocodone on occasion for various MSK pains- last rx was in October  He has a chronic issue with his right great toe and left knee- he has seen ortho and they did not recommend surgery -he has been using hydrocodone for these issues Can refill for him today He uses clonazepam regularly, recently refilled.  I will place more refills on file from  Patient notes he tends to have chronic constipation-even when he is not regularly using narcotic pain medication He is also interested in try to taper off some of his medications, he wishes he was taking less medicine  Patient Active Problem List   Diagnosis Date Noted   Abnormal CT scan of lung 10/29/2020   Chronic bronchitis ([facility]) 10/29/2020   Urinary frequency    Recurrent canker sores    Primary snoring    OSA (obstructive sleep apnea)    Morton's neuroma    Lower back pain    Insomnia    Hypertension    History of migraine    Herpes simplex type 1 infection    GERD (gastroesophageal reflux disease)    Headache    Fibromyalgia    Emphysema of lung (HCC)    Dysphagia    Depression    BPH (benign prostatic hyperplasia)    Basal cell carcinoma    Arthritis    Anxiety    Dizziness 11/29/2019   Acquired mallet deformity of finger  of right hand 11/25/2019   Chronic cough 02/09/2019   Tear of right rotator cuff 12/13/2017   Right rotator cuff tendinitis 01/10/2017   Hearing loss 12/15/2016   Chronic fatigue 10/09/2015   Generalized anxiety disorder 10/09/2015   Memory loss 10/09/2015   Chronic SI joint pain 05/25/2015   Recurrent major depressive disorder, in partial remission ([facility]) 04/30/2015   Obstructive sleep apnea of adult 02/09/2015   Lumbar facet joint pain 04/09/2014   Benign prostatic hyperplasia with urinary obstruction 11/18/2013   Acid reflux 11/18/2013   Degenerative arthritis of lumbar spine 11/18/2013   Headache, migraine 11/18/2013   Anal pain 11/18/2013   Atopic dermatitis 11/18/2013   Bladder neck contracture 11/18/2013   Constipation 11/18/2013   Crush injury to finger 11/18/2013   Deviated nasal septum 11/18/2013   Empty sella syndrome ([facility]) 11/18/2013   Abdominal pain 11/18/2013   Hematoma, subungual, third finger, left 11/18/2013   Hypertrophy of nasal turbinates 11/18/2013   Laceration of third finger, left 11/18/2013   Lateral epicondylitis 11/18/2013   Lymph nodes enlarged 11/18/2013   Nonallopathic lesion of sacral region 11/18/2013   OAB (overactive bladder) 11/18/2013   Open fracture of distal phalanx of third finger of left hand 11/18/2013   Other intervertebral disc degeneration, lumbar region  11/18/2013   External hemorrhoids with other complication 82/42/3536   Restless leg 05/27/2009    Past Medical History:  Diagnosis Date   Anxiety    Arthritis    Basal cell carcinoma    BPH (benign prostatic hyperplasia)    Depression    Dysphagia    Emphysema of lung (HCC)    Fibromyalgia    GERD (gastroesophageal reflux disease)    otc  tums   Headache    migraines   Herpes simplex type 1 infection    History of migraine    Hypertension    no longer on medication   Insomnia    Lower back pain    Morton's neuroma    OSA (obstructive sleep apnea)    Primary snoring     Recurrent canker sores    Urinary frequency     Past Surgical History:  Procedure Laterality Date   EPIDIDYMECTOMY     for spermatocele   EYE SURGERY Bilateral    lasik eye surgery   FOOT SURGERY Left    INGUINAL HERNIA REPAIR Left    01/25/1999   MOUTH SURGERY     pancreatic surgery r/t trauma     SEPTOPLASTY N/A 02/09/2015   Procedure: SEPTOPLASTY;  Surgeon: Rozetta Nunnery, MD;  Location: [facility];  Service: ENT;  Laterality: N/A;   SKIN CANCER EXCISION     TONSILLECTOMY     TRANSURETHRAL INCISION OF PROSTATE     TURBINATE REDUCTION Bilateral 02/09/2015   Procedure: BILATERAL TURBINATE REDUCTION;  Surgeon: Rozetta Nunnery, MD;  Location: [facility];  Service: ENT;  Laterality: Bilateral;   UVULOPALATOPHARYNGOPLASTY N/A 02/09/2015   Procedure: UVULOPALATOPHARYNGOPLASTY (UPPP);  Surgeon: Rozetta Nunnery, MD;  Location: [facility];  Service: ENT;  Laterality: N/A;    Social History   Tobacco Use   Smoking status: Former    Packs/day: 2.00    Years: 30.00    Pack years: 60.00    Types: Cigarettes, Pipe, Cigars    Start date: 03/1967    Quit date: 10/29/2005    Years since quitting: 15.4   Smokeless tobacco: Never  Substance Use Topics   Alcohol use: Yes    Alcohol/week: 1.0 standard drink    Types: 1 Cans of beer per week    Comment: 1 beer every other day   Drug use: No    Family History  Problem Relation Age of Onset   Coronary artery disease Mother    Stroke Mother    Hypertension Father    Anxiety disorder Brother    Skin cancer Brother    Stroke Brother     No Known Allergies  Medication list has been reviewed and updated.  Current Outpatient Medications on File Prior to Visit  Medication Sig Dispense Refill   albuterol (VENTOLIN HFA) 108 (90 Base) MCG/ACT inhaler Inhale 2 puffs into the lungs every 6 (six) hours as needed for wheezing or shortness of breath. 1 each 0   benzonatate (TESSALON) 200 MG capsule Take 1 capsule (200 mg total) by mouth  3 (three) times daily as needed for cough. 60 capsule 2   famotidine (PEPCID) 20 MG tablet TAKE 1 TABLET BY MOUTH ONCE TO TWICE A DAY AS NEEDED FOR TO CONTROL GERD 180 tablet 3   fluticasone (FLONASE) 50 MCG/ACT nasal spray Place 2 sprays into both nostrils daily. 43 g 3   indomethacin (INDOCIN) 25 MG capsule Take 1 to 2 capsules three times daily as needed for headache. [place]  capsule 0   losartan (COZAAR) 50 MG tablet TAKE ONE TABLET BY MOUTH DAILY 90 tablet 3   Melatonin 5 MG TABS Take 5 mg by mouth at bedtime.     methylphenidate (RITALIN) 20 MG tablet Take 1 tablet (20 mg total) by mouth 2 (two) times daily. Use as needed for chronic fatigue 30 tablet 0   montelukast (SINGULAIR) 10 MG tablet Take 1 tablet (10 mg total) by mouth at bedtime. 90 tablet 3   pantoprazole (PROTONIX) 40 MG tablet TAKE ONE TABLET BY MOUTH DAILY 30 tablet 3   pregabalin (LYRICA) 100 MG capsule Take 2 capsules (200 mg total) by mouth at bedtime. 180 capsule 3   rosuvastatin (CRESTOR) 10 MG tablet TAKE ONE TABLET BY MOUTH DAILY 30 tablet 0   sildenafil (VIAGRA) 100 MG tablet Take 0.5-1 tablets (50-100 mg total) by mouth daily as needed for erectile dysfunction. 6 tablet 11   sucralfate (CARAFATE) 1 g tablet Take 1 tablet (1 g total) by mouth 4 (four) times daily -  with meals and at bedtime. 40 tablet 0   SUMAtriptan (IMITREX) 100 MG tablet Take 1 tablet earliest onset of migraine.  May repeat in 2 hours if headache persists or recurs.  Maximum 2 tablets in 24 hours. 10 tablet 5   tolterodine (DETROL LA) 4 MG 24 hr capsule Take 1 capsule (4 mg total) by mouth daily. 90 capsule 3   topiramate (TOPAMAX) 100 MG tablet Take 1 tablet (100 mg total) by mouth daily. 90 tablet 1   traZODone (DESYREL) 150 MG tablet TAKE ONE TABLET BY MOUTH EVERY NIGHT AT BEDTIME AS NEEDED FOR SLEEP 30 tablet 4   umeclidinium bromide (INCRUSE ELLIPTA) 62.5 MCG/INH AEPB Inhale 1 puff into the lungs daily. 30 each 1   vitamin B-12 (CYANOCOBALAMIN) 500  MCG tablet Take 500 mcg by mouth daily.     metoprolol tartrate (LOPRESSOR) 100 MG tablet Take 1 tablet (100 mg total) by mouth once for 1 dose. Take two hours prior to your cardiac CT 1 tablet 0   No current facility-administered medications on file prior to visit.    Review of Systems:  As per HPI- otherwise negative.   Physical Examination: Vitals:   03/25/21 1554  BP: 118/72  Pulse: 67  Resp: 18  Temp: 97.7 F (36.5 C)  SpO2: 99%   Vitals:   03/25/21 1554  Weight: 191 lb 9.6 oz (86.9 kg)  Height: 5' 10.08" (1.78 m)   Body mass index is 27.43 kg/m. Ideal Body Weight: Weight in (lb) to have BMI = 25: 174.3  GEN: no acute distress.  Minimal overweight, looks well and his normal self HEENT: Atraumatic, Normocephalic.  Ears and Nose: No external deformity. CV: RRR, No M/G/R. No JVD. No thrill. No extra heart sounds. PULM: CTA B, no wheezes, crackles, rhonchi. No retractions. No resp. distress. No accessory muscle use. ABD: S, NT, ND, +BS. No rebound. No HSM. EXTR: No c/c/e PSYCH: Normally interactive. Conversant.    Assessment and Plan: Primary hypertension - Plan: CBC, Comprehensive metabolic panel  GAD (generalized anxiety disorder) - Plan: clonazePAM (KLONOPIN) 0.5 MG tablet  Osteoarthritis of lumbar spine, unspecified spinal osteoarthritis complication status - Plan: HYDROcodone-acetaminophen (NORCO/VICODIN) 5-325 MG tablet  Sciatica, unspecified laterality - Plan: HYDROcodone-acetaminophen (NORCO/VICODIN) 5-325 MG tablet  Screening for diabetes mellitus - Plan: Hemoglobin A1c  Screening for hyperlipidemia - Plan: Lipid panel  Screening for prostate cancer - Plan: PSA, Medicare ( [place] [facility] only)  Medication monitoring encounter -  Plan: DRUG MONITORING, PANEL 8 WITH CONFIRMATION, URINE  Elevated glucose - Plan: Hemoglobin A1c  Polypharmacy  Patient seen today for follow-up.  Blood pressure under good control, continue current medications Refill  clonazepam which he uses for anxiety Refill hydrocodone which is used for various musculoskeletal issues, specifically degenerative lumbar disease  Routine screening labs as above  We went over his medicines today, I made a few suggestions about medications he could try tapering off of gradually, 1 at a time He is using Topamax for migraine prophylaxis, suggested he try tapering this medicine, could also cut down on Lyrica and trazodone if he likes  Will plan further follow- up pending labs.   UDS  Signed Lamar Blinks, MD  Addendum 1/13, received labs as below.  Message to patient Results for orders placed or performed in visit on 03/25/21  CBC  Result Value Ref Range   WBC 3.9 (L) 4.0 - 10.5 K/uL   RBC 4.73 4.22 - 5.81 Mil/uL   Platelets 137.0 (L) 150.0 - 400.0 K/uL   Hemoglobin 14.9 13.0 - 17.0 g/dL   HCT 44.6 39.0 - 52.0 %   MCV 94.2 78.0 - 100.0 fl   MCHC 33.4 30.0 - 36.0 g/dL   RDW 13.3 11.5 - 15.5 %  Comprehensive metabolic panel  Result Value Ref Range   Sodium 142 135 - 145 mEq/L   Potassium 4.1 3.5 - 5.1 mEq/L   Chloride 108 96 - 112 mEq/L   CO2 28 19 - 32 mEq/L   Glucose, Bld 88 70 - 99 mg/dL   BUN 14 6 - 23 mg/dL   Creatinine, Ser 1.26 0.40 - 1.50 mg/dL   Total Bilirubin 0.7 0.2 - 1.2 mg/dL   Alkaline Phosphatase 55 39 - 117 U/L   AST 17 0 - 37 U/L   ALT 18 0 - 53 U/L   Total Protein 6.7 6.0 - 8.3 g/dL   Albumin 4.6 3.5 - 5.2 g/dL   GFR 58.63 (L) >60.00 mL/min   Calcium 9.3 8.4 - 10.5 mg/dL  Hemoglobin A1c  Result Value Ref Range   Hgb A1c MFr Bld 5.3 4.6 - 6.5 %  Lipid panel  Result Value Ref Range   Cholesterol 110 0 - 200 mg/dL   Triglycerides 71.0 0.0 - 149.0 mg/dL   HDL 46.10 >39.00 mg/dL   VLDL 14.2 0.0 - 40.0 mg/dL   LDL Cholesterol 50 0 - 99 mg/dL   Total CHOL/HDL Ratio 2    NonHDL 63.93   PSA, Medicare ( [place] [facility] only)  Result Value Ref Range   PSA 0.36 0.10 - 4.00 ng/ml

## 2021-03-25 ENCOUNTER — Ambulatory Visit (INDEPENDENT_AMBULATORY_CARE_PROVIDER_SITE_OTHER): Payer: Medicare Other | Admitting: Family Medicine

## 2021-03-25 VITALS — BP 118/72 | HR 67 | Temp 97.7°F | Resp 18 | Ht 70.08 in | Wt 191.6 lb

## 2021-03-25 DIAGNOSIS — M47816 Spondylosis without myelopathy or radiculopathy, lumbar region: Secondary | ICD-10-CM

## 2021-03-25 DIAGNOSIS — R7309 Other abnormal glucose: Secondary | ICD-10-CM

## 2021-03-25 DIAGNOSIS — Z13220 Encounter for screening for lipoid disorders: Secondary | ICD-10-CM

## 2021-03-25 DIAGNOSIS — F411 Generalized anxiety disorder: Secondary | ICD-10-CM

## 2021-03-25 DIAGNOSIS — M5430 Sciatica, unspecified side: Secondary | ICD-10-CM

## 2021-03-25 DIAGNOSIS — Z5181 Encounter for therapeutic drug level monitoring: Secondary | ICD-10-CM

## 2021-03-25 DIAGNOSIS — Z79899 Other long term (current) drug therapy: Secondary | ICD-10-CM

## 2021-03-25 DIAGNOSIS — I10 Essential (primary) hypertension: Secondary | ICD-10-CM | POA: Diagnosis not present

## 2021-03-25 DIAGNOSIS — Z131 Encounter for screening for diabetes mellitus: Secondary | ICD-10-CM | POA: Diagnosis not present

## 2021-03-25 DIAGNOSIS — Z125 Encounter for screening for malignant neoplasm of prostate: Secondary | ICD-10-CM | POA: Diagnosis not present

## 2021-03-25 MED ORDER — CLONAZEPAM 0.5 MG PO TABS: ORAL_TABLET | ORAL | 1 refills | Status: DC

## 2021-03-25 MED ORDER — HYDROCODONE-ACETAMINOPHEN 5-325 MG PO TABS: 1.0000 | ORAL_TABLET | Freq: Three times a day (TID) | ORAL | 0 refills | Status: DC | PRN

## 2021-03-25 NOTE — Patient Instructions (Addendum)
It was good to see you today I will be in touch with your labs For constipation, I might suggest using a dose of miralax once a day (OTC) If you would like to stop some of your medications- would be ok to try tapering off  - lyrica, could cut down to one daily first - topamax- decrease to a 1/2 tablet daily and then try stopping - trazodone- try cutting down to a 1/2 tablet at bedtime   Assuming all is ok please see me in about 6 months Let me know if you need any refills

## 2021-03-26 ENCOUNTER — Encounter: Payer: Self-pay | Admitting: Family Medicine

## 2021-03-26 DIAGNOSIS — D696 Thrombocytopenia, unspecified: Secondary | ICD-10-CM

## 2021-03-26 DIAGNOSIS — D72819 Decreased white blood cell count, unspecified: Secondary | ICD-10-CM

## 2021-03-26 LAB — DRUG MONITORING, PANEL 8 WITH CONFIRMATION, URINE
6 Acetylmorphine: NEGATIVE ng/mL (ref <10)
Alcohol Metabolites: NEGATIVE ng/mL (ref <500)
Amphetamines: NEGATIVE ng/mL (ref <500)
Benzodiazepines: NEGATIVE ng/mL (ref <100)
Buprenorphine, Urine: NEGATIVE ng/mL (ref <5)
Cocaine Metabolite: NEGATIVE ng/mL (ref <150)
Creatinine: 57.5 mg/dL (ref >=20.0)
MDMA: NEGATIVE ng/mL (ref <500)
Marijuana Metabolite: NEGATIVE ng/mL (ref <20)
Opiates: NEGATIVE ng/mL (ref <100)
Oxidant: NEGATIVE ug/mL (ref <200)
Oxycodone: NEGATIVE ng/mL (ref <100)
pH: 5.9 (ref 4.5–9.0)

## 2021-03-26 LAB — COMPREHENSIVE METABOLIC PANEL
ALT: 18 U/L (ref 0–53)
AST: 17 U/L (ref 0–37)
Albumin: 4.6 g/dL (ref 3.5–5.2)
Alkaline Phosphatase: 55 U/L (ref 39–117)
BUN: 14 mg/dL (ref 6–23)
CO2: 28 mEq/L (ref 19–32)
Calcium: 9.3 mg/dL (ref 8.4–10.5)
Chloride: 108 mEq/L (ref 96–112)
Creatinine, Ser: 1.26 mg/dL (ref 0.40–1.50)
GFR: 58.63 mL/min — ABNORMAL LOW (ref >60.00)
Glucose, Bld: 88 mg/dL (ref 70–99)
Potassium: 4.1 mEq/L (ref 3.5–5.1)
Sodium: 142 mEq/L (ref 135–145)
Total Bilirubin: 0.7 mg/dL (ref 0.2–1.2)
Total Protein: 6.7 g/dL (ref 6.0–8.3)

## 2021-03-26 LAB — CBC
HCT: 44.6 % (ref 39.0–52.0)
Hemoglobin: 14.9 g/dL (ref 13.0–17.0)
MCHC: 33.4 g/dL (ref 30.0–36.0)
MCV: 94.2 fl (ref 78.0–100.0)
Platelets: 137 10*3/uL — ABNORMAL LOW (ref 150.0–400.0)
RBC: 4.73 Mil/uL (ref 4.22–5.81)
RDW: 13.3 % (ref 11.5–15.5)
WBC: 3.9 10*3/uL — ABNORMAL LOW (ref 4.0–10.5)

## 2021-03-26 LAB — LIPID PANEL
Cholesterol: 110 mg/dL (ref 0–200)
HDL: 46.1 mg/dL (ref >39.00)
LDL Cholesterol: 50 mg/dL (ref 0–99)
NonHDL: 63.93
Total CHOL/HDL Ratio: 2
Triglycerides: 71 mg/dL (ref 0.0–149.0)
VLDL: 14.2 mg/dL (ref 0.0–40.0)

## 2021-03-26 LAB — DM TEMPLATE

## 2021-03-26 LAB — PSA, MEDICARE: PSA: 0.36 ng/ml (ref 0.10–4.00)

## 2021-03-26 LAB — HEMOGLOBIN A1C: Hgb A1c MFr Bld: 5.3 % (ref 4.6–6.5)

## 2021-04-01 ENCOUNTER — Other Ambulatory Visit: Payer: Self-pay | Admitting: Family

## 2021-04-01 DIAGNOSIS — D72819 Decreased white blood cell count, unspecified: Secondary | ICD-10-CM

## 2021-04-02 ENCOUNTER — Inpatient Hospital Stay: Payer: Medicare Other | Attending: Hematology & Oncology

## 2021-04-02 ENCOUNTER — Encounter: Payer: Self-pay | Admitting: Family

## 2021-04-02 ENCOUNTER — Other Ambulatory Visit: Payer: Self-pay

## 2021-04-02 ENCOUNTER — Inpatient Hospital Stay (HOSPITAL_BASED_OUTPATIENT_CLINIC_OR_DEPARTMENT_OTHER): Payer: Medicare Other | Admitting: Family

## 2021-04-02 VITALS — BP 109/74 | HR 82 | Temp 97.6°F | Resp 17 | Ht 72.0 in | Wt 190.8 lb

## 2021-04-02 DIAGNOSIS — J439 Emphysema, unspecified: Secondary | ICD-10-CM

## 2021-04-02 DIAGNOSIS — Z87891 Personal history of nicotine dependence: Secondary | ICD-10-CM | POA: Diagnosis not present

## 2021-04-02 DIAGNOSIS — D72819 Decreased white blood cell count, unspecified: Secondary | ICD-10-CM

## 2021-04-02 DIAGNOSIS — M79644 Pain in right finger(s): Secondary | ICD-10-CM

## 2021-04-02 DIAGNOSIS — F1090 Alcohol use, unspecified, uncomplicated: Secondary | ICD-10-CM | POA: Insufficient documentation

## 2021-04-02 DIAGNOSIS — G8929 Other chronic pain: Secondary | ICD-10-CM

## 2021-04-02 LAB — CBC WITH DIFFERENTIAL (CANCER CENTER ONLY)
Abs Immature Granulocytes: 0.02 10*3/uL (ref 0.00–0.07)
Basophils Absolute: 0 10*3/uL (ref 0.0–0.1)
Basophils Relative: 1 %
Eosinophils Absolute: 0.2 10*3/uL (ref 0.0–0.5)
Eosinophils Relative: 4 %
HCT: 45.5 % (ref 39.0–52.0)
Hemoglobin: 15.2 g/dL (ref 13.0–17.0)
Immature Granulocytes: 0 %
Lymphocytes Relative: 28 %
Lymphs Abs: 1.5 10*3/uL (ref 0.7–4.0)
MCH: 31.4 pg (ref 26.0–34.0)
MCHC: 33.4 g/dL (ref 30.0–36.0)
MCV: 94 fL (ref 80.0–100.0)
Monocytes Absolute: 0.5 10*3/uL (ref 0.1–1.0)
Monocytes Relative: 9 %
Neutro Abs: 3.1 10*3/uL (ref 1.7–7.7)
Neutrophils Relative %: 58 %
Platelet Count: 128 10*3/uL — ABNORMAL LOW (ref 150–400)
RBC: 4.84 MIL/uL (ref 4.22–5.81)
RDW: 12.5 % (ref 11.5–15.5)
WBC Count: 5.3 10*3/uL (ref 4.0–10.5)
nRBC: 0 % (ref 0.0–0.2)

## 2021-04-02 LAB — CMP (CANCER CENTER ONLY)
ALT: 18 U/L (ref 0–44)
AST: 18 U/L (ref 15–41)
Albumin: 4.7 g/dL (ref 3.5–5.0)
Alkaline Phosphatase: 60 U/L (ref 38–126)
Anion gap: 6 (ref 5–15)
BUN: 18 mg/dL (ref 8–23)
CO2: 29 mmol/L (ref 22–32)
Calcium: 9.8 mg/dL (ref 8.9–10.3)
Chloride: 106 mmol/L (ref 98–111)
Creatinine: 1.12 mg/dL (ref 0.61–1.24)
GFR, Estimated: >60 mL/min (ref >60)
Glucose, Bld: 86 mg/dL (ref 70–99)
Potassium: 3.9 mmol/L (ref 3.5–5.1)
Sodium: 141 mmol/L (ref 135–145)
Total Bilirubin: 1 mg/dL (ref 0.3–1.2)
Total Protein: 7.1 g/dL (ref 6.5–8.1)

## 2021-04-02 LAB — LACTATE DEHYDROGENASE: LDH: 141 U/L (ref 98–192)

## 2021-04-02 LAB — SAVE SMEAR(SSMR), FOR PROVIDER SLIDE REVIEW

## 2021-04-02 NOTE — Progress Notes (Signed)
Hematology/Oncology Consultation   Name: Patrick [name]      MRN: [id number]    Location: Room/bed info not found  Date: 04/02/2021 Time:9:08 AM   REFERRING PHYSICIAN: Silvestre Mesi, MD  REASON FOR CONSULT: Leukopenia    DIAGNOSIS: Mild intermittent leukopenia   HISTORY OF PRESENT ILLNESS: [name] is a very pleasant 71 yo caucasian gentleman with intermittent leukopenia over the last 7 years (first noted in July 2019).  He has been asymptomatic with this. No issue with frequent or recurrent infections.  WBC count today is stable at 5.3, neutrophils 58%, lymphocytes 28%, Hgb 15.2 and platelets 128.  No familial history of blood abnormalities. No personal or known familial history of cancer.  He notes fatigue at times.  No history of diabetes or thyroid disease.  He has mild SOB with exertion due to emphysema and has been referred to pulmonology for further work-up.  He had history of sleep apnea. He was not able to tolerate the CPAP machine and had surgery to treat.  No fever, chills, n/v, cough, rash, dizziness, chest pain, palpitations, abdominal pain or changes in bowel or bladder habits.  He takes a stool softener regularly to prevent constipation.  No swelling, numbness or tingling in his extremities at this time.  He has chronic pain in his right pinky finger felt to likely be arthritis from an old injury.  No falls or syncope to report.  He has a good appetite and is staying well hydrated. His weight is stable at 190 lbs.  He quite smoking 15 years ago. He has a beer occasionally and will have 1-2 mixed drinks on the weekend. No recreational drug use.  He stays quite busy. 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  He enjoys walking for for exercise.   ROS: All other 10 point review of systems is negative.   PAST MEDICAL HISTORY:   Past Medical History:  Diagnosis Date   Anxiety    Arthritis    Basal cell carcinoma    BPH (benign  prostatic hyperplasia)    Depression    Dysphagia    Emphysema of lung ([facility]CC)    Fibromyalgia    GERD (gastroesophageal reflux disease)    otc  tums   Headache    migraines   Herpes simplex type 1 infection    History of migraine    Hypertension    no longer on medication   Insomnia    Lower back pain    Morton's neuroma    OSA (obstructive sleep apnea)    Primary snoring    Recurrent canker sores    Urinary frequency     ALLERGIES: No Known Allergies    MEDICATIONS:  Current Outpatient Medications on File Prior to Visit  Medication Sig Dispense Refill   albuterol (VENTOLIN HFA) 108 (90 Base) MCG/ACT inhaler Inhale 2 puffs into the lungs every 6 (six) hours as needed for wheezing or shortness of breath. 1 each 0   benzonatate (TESSALON) 200 MG capsule Take 1 capsule (200 mg total) by mouth 3 (three) times daily as needed for cough. 60 capsule 2   clonazePAM (KLONOPIN) 0.5 MG tablet TAKE ONE TO TWO TABLETS BY MOUTH EVERY NIGHT AT BEDTIME AS NEEDED FOR SLEEP 180 tablet 1   famotidine (PEPCID) 20 MG tablet TAKE 1 TABLET BY MOUTH ONCE TO TWICE A DAY AS NEEDED FOR TO CONTROL GERD 180 tablet 3   fluticasone (FLONASE) 50 MCG/ACT nasal spray Place  2 sprays into both nostrils daily. 43 g 3   HYDROcodone-acetaminophen (NORCO/VICODIN) 5-325 MG tablet Take 1 tablet by mouth every 8 (eight) hours as needed for moderate pain. 30 tablet 0   indomethacin (INDOCIN) 25 MG capsule Take 1 to 2 capsules three times daily as needed for headache. 20 capsule 0   losartan (COZAAR) 50 MG tablet TAKE ONE TABLET BY MOUTH DAILY 90 tablet 3   Melatonin 5 MG TABS Take 5 mg by mouth at bedtime.     methylphenidate (RITALIN) 20 MG tablet Take 1 tablet (20 mg total) by mouth 2 (two) times daily. Use as needed for chronic fatigue 30 tablet 0   metoprolol tartrate (LOPRESSOR) 100 MG tablet Take 1 tablet (100 mg total) by mouth once for 1 dose. Take two hours prior to your cardiac CT 1 tablet 0   montelukast  (SINGULAIR) 10 MG tablet Take 1 tablet (10 mg total) by mouth at bedtime. 90 tablet 3   pantoprazole (PROTONIX) 40 MG tablet TAKE ONE TABLET BY MOUTH DAILY 30 tablet 3   pregabalin (LYRICA) 100 MG capsule Take 2 capsules (200 mg total) by mouth at bedtime. 180 capsule 3   rosuvastatin (CRESTOR) 10 MG tablet TAKE ONE TABLET BY MOUTH DAILY 30 tablet 0   sildenafil (VIAGRA) 100 MG tablet Take 0.5-1 tablets (50-100 mg total) by mouth daily as needed for erectile dysfunction. 6 tablet 11   sucralfate (CARAFATE) 1 g tablet Take 1 tablet (1 g total) by mouth 4 (four) times daily -  with meals and at bedtime. 40 tablet 0   SUMAtriptan (IMITREX) 100 MG tablet Take 1 tablet earliest onset of migraine.  May repeat in 2 hours if headache persists or recurs.  Maximum 2 tablets in 24 hours. 10 tablet 5   tolterodine (DETROL LA) 4 MG 24 hr capsule Take 1 capsule (4 mg total) by mouth daily. 90 capsule 3   topiramate (TOPAMAX) 100 MG tablet Take 1 tablet (100 mg total) by mouth daily. 90 tablet 1   traZODone (DESYREL) 150 MG tablet TAKE ONE TABLET BY MOUTH EVERY NIGHT AT BEDTIME AS NEEDED FOR SLEEP 30 tablet 4   umeclidinium bromide (INCRUSE ELLIPTA) 62.5 MCG/INH AEPB Inhale 1 puff into the lungs daily. 30 each 1   vitamin B-12 (CYANOCOBALAMIN) 500 MCG tablet Take 500 mcg by mouth daily.     No current facility-administered medications on file prior to visit.     PAST SURGICAL HISTORY Past Surgical History:  Procedure Laterality Date   EPIDIDYMECTOMY     for spermatocele   EYE SURGERY Bilateral    lasik eye surgery   FOOT SURGERY Left    INGUINAL HERNIA REPAIR Left    01/25/1999   MOUTH SURGERY     pancreatic surgery r/t trauma     SEPTOPLASTY N/A 02/09/2015   Procedure: SEPTOPLASTY;  Surgeon: Rozetta Nunnery, MD;  Location: [facility];  Service: ENT;  Laterality: N/A;   SKIN CANCER EXCISION     TONSILLECTOMY     TRANSURETHRAL INCISION OF PROSTATE     TURBINATE REDUCTION Bilateral 02/09/2015    Procedure: BILATERAL TURBINATE REDUCTION;  Surgeon: Rozetta Nunnery, MD;  Location: [facility];  Service: ENT;  Laterality: Bilateral;   UVULOPALATOPHARYNGOPLASTY N/A 02/09/2015   Procedure: UVULOPALATOPHARYNGOPLASTY (UPPP);  Surgeon: Rozetta Nunnery, MD;  Location: [facility];  Service: ENT;  Laterality: N/A;    FAMILY HISTORY: Family History  Problem Relation Age of Onset   Coronary artery disease Mother  Stroke Mother    Hypertension Father    Anxiety disorder Brother    Skin cancer Brother    Stroke Brother     SOCIAL HISTORY:  reports that he quit smoking about 15 years ago. His smoking use included cigarettes, pipe, and cigars. He started smoking about 54 years ago. He has a 60.00 pack-year smoking history. He has never used smokeless tobacco. He reports current alcohol use of about 1.0 standard drink per week. He reports that he does not use drugs.  PERFORMANCE STATUS: The patient's performance status is 0 - Asymptomatic  PHYSICAL EXAM: Most Recent Vital Signs: Blood pressure 109/74, pulse 82, temperature 97.6 F (36.4 C), temperature source Oral, resp. rate 17, height 6' (1.829 m), weight 190 lb 12 oz (86.5 kg), SpO2 100 %. BP 109/74 (BP Location: Right Arm, Patient Position: Sitting)    Pulse 82    Temp 97.6 F (36.4 C) (Oral)    Resp 17    Ht 6' (1.829 m)    Wt 190 lb 12 oz (86.5 kg)    SpO2 100%    BMI 25.87 kg/m   General Appearance:    Alert, cooperative, no distress, appears stated age  Head:    Normocephalic, without obvious abnormality, atraumatic  Eyes:    PERRL, conjunctiva/corneas clear, EOM's intact, fundi    benign, both eyes             Throat:   Lips, mucosa, and tongue normal; teeth and gums normal  Neck:   Supple, symmetrical, trachea midline, no adenopathy;       thyroid:  No enlargement/tenderness/nodules; no carotid   bruit or JVD  Back:     Symmetric, no curvature, ROM normal, no CVA tenderness  Lungs:     Clear to auscultation bilaterally,  respirations unlabored  Chest wall:    No tenderness or deformity  Heart:    Regular rate and rhythm, S1 and S2 normal, no murmur, rub   or gallop  Abdomen:     Soft, non-tender, bowel sounds active all four quadrants,    no masses, no organomegaly        Extremities:   Extremities normal, atraumatic, no cyanosis or edema  Pulses:   2+ and symmetric all extremities  Skin:   Skin color, texture, turgor normal, no rashes or lesions  Lymph nodes:   Cervical, supraclavicular, and axillary nodes normal  Neurologic:   CNII-XII intact. Normal strength, sensation and reflexes      throughout    LABORATORY DATA:  Results for orders placed or performed in visit on 04/02/21 (from the past 48 hour(s))  CBC with Differential ([facility] Only)     Status: Abnormal   Collection Time: 04/02/21  8:36 AM  Result Value Ref Range   WBC Count 5.3 4.0 - 10.5 K/uL   RBC 4.84 4.22 - 5.81 MIL/uL   Hemoglobin 15.2 13.0 - 17.0 g/dL   HCT 45.5 39.0 - 52.0 %   MCV 94.0 80.0 - 100.0 fL   MCH 31.4 26.0 - 34.0 pg   MCHC 33.4 30.0 - 36.0 g/dL   RDW 12.5 11.5 - 15.5 %   Platelet Count 128 (L) 150 - 400 K/uL   nRBC 0.0 0.0 - 0.2 %   Neutrophils Relative % 58 %   Neutro Abs 3.1 1.7 - 7.7 K/uL   Lymphocytes Relative 28 %   Lymphs Abs 1.5 0.7 - 4.0 K/uL   Monocytes Relative 9 %   Monocytes Absolute  0.5 0.1 - 1.0 K/uL   Eosinophils Relative 4 %   Eosinophils Absolute 0.2 0.0 - 0.5 K/uL   Basophils Relative 1 %   Basophils Absolute 0.0 0.0 - 0.1 K/uL   Immature Granulocytes 0 %   Abs Immature Granulocytes 0.02 0.00 - 0.07 K/uL    Comment: Performed at [facility] at [facility], [address], [place], [place] [address]  Save Smear [facility])     Status: None   Collection Time: 04/02/21  8:36 AM  Result Value Ref Range   Smear Review SMEAR STAINED AND AVAILABLE FOR REVIEW     Comment: Performed at [facility] at [facility], [address] [place], [place] [address]      RADIOGRAPHY: No results found.     PATHOLOGY: None  ASSESSMENT/PLAN: Mr. Schoffstall is a very pleasant 71 yo caucasian gentleman with intermittent leukopenia over the last 7 years (first noted in July 2019).  CBC and CMP unremarkable at this time.  Will review blood smear with MD once her returns to the office on Monday.   All questions were answered. The patient knows to call the clinic with any problems, questions or concerns. We can certainly see the patient much sooner if necessary.   Lottie Dawson, NP    Addendum: Lab work and blood smear reviewed with Dr. Marin Olp on 04/05/2021. No abnormality or evidence of malignancy noted on smear review. Patient notified and appreciative of call. No questions or concerns at this time.

## 2021-04-06 ENCOUNTER — Telehealth: Payer: Self-pay | Admitting: *Deleted

## 2021-04-06 ENCOUNTER — Other Ambulatory Visit: Payer: Self-pay | Admitting: Family Medicine

## 2021-04-06 NOTE — Telephone Encounter (Signed)
Per 04/02/21 LOS - No follow up needed

## 2021-05-06 ENCOUNTER — Ambulatory Visit: Payer: Medicare Other | Admitting: Neurology

## 2021-05-10 ENCOUNTER — Other Ambulatory Visit: Payer: Self-pay | Admitting: Family Medicine

## 2021-05-10 DIAGNOSIS — J45998 Other asthma: Secondary | ICD-10-CM

## 2021-06-22 ENCOUNTER — Encounter: Payer: Self-pay | Admitting: Family Medicine

## 2021-06-22 DIAGNOSIS — M47816 Spondylosis without myelopathy or radiculopathy, lumbar region: Secondary | ICD-10-CM

## 2021-06-22 DIAGNOSIS — M5430 Sciatica, unspecified side: Secondary | ICD-10-CM

## 2021-06-23 MED ORDER — HYDROCODONE-ACETAMINOPHEN 5-325 MG PO TABS: 1.0000 | ORAL_TABLET | Freq: Three times a day (TID) | ORAL | 0 refills | Status: DC | PRN

## 2021-07-15 ENCOUNTER — Telehealth: Payer: Self-pay | Admitting: Pulmonary Disease

## 2021-07-15 NOTE — Telephone Encounter (Signed)
I have these to work on he will be schedule and I will call him ?

## 2021-07-26 ENCOUNTER — Encounter: Payer: Self-pay | Admitting: Family Medicine

## 2021-07-26 DIAGNOSIS — R5382 Chronic fatigue, unspecified: Secondary | ICD-10-CM

## 2021-07-26 MED ORDER — METHYLPHENIDATE HCL 20 MG PO TABS: 20.0000 mg | ORAL_TABLET | Freq: Two times a day (BID) | ORAL | 0 refills | Status: DC

## 2021-07-27 MED ORDER — METHYLPHENIDATE HCL 20 MG PO TABS: 20.0000 mg | ORAL_TABLET | Freq: Two times a day (BID) | ORAL | 0 refills | Status: DC

## 2021-07-27 NOTE — Addendum Note (Signed)
Addended by: Lamar Blinks C on: 07/27/2021 08:13 AM ? ? Modules accepted: Orders ? ?

## 2021-08-03 ENCOUNTER — Other Ambulatory Visit: Payer: Self-pay | Admitting: Family Medicine

## 2021-08-17 ENCOUNTER — Other Ambulatory Visit: Payer: Self-pay | Admitting: Family Medicine

## 2021-08-17 DIAGNOSIS — F5101 Primary insomnia: Secondary | ICD-10-CM

## 2021-08-24 ENCOUNTER — Other Ambulatory Visit: Payer: Self-pay | Admitting: Family Medicine

## 2021-08-24 DIAGNOSIS — G894 Chronic pain syndrome: Secondary | ICD-10-CM

## 2021-09-21 ENCOUNTER — Ambulatory Visit (HOSPITAL_BASED_OUTPATIENT_CLINIC_OR_DEPARTMENT_OTHER)
Admission: RE | Admit: 2021-09-21 | Discharge: 2021-09-21 | Disposition: A | Discharge status: Home / Self Care | Payer: Medicare Other | Source: Ambulatory Visit | Attending: Pulmonary Disease | Admitting: Pulmonary Disease

## 2021-09-21 DIAGNOSIS — J42 Unspecified chronic bronchitis: Secondary | ICD-10-CM | POA: Insufficient documentation

## 2021-09-21 DIAGNOSIS — R918 Other nonspecific abnormal finding of lung field: Secondary | ICD-10-CM | POA: Diagnosis present

## 2021-09-22 ENCOUNTER — Encounter: Payer: Self-pay | Admitting: Family Medicine

## 2021-09-22 ENCOUNTER — Other Ambulatory Visit: Payer: Self-pay | Admitting: Family Medicine

## 2021-09-22 DIAGNOSIS — F411 Generalized anxiety disorder: Secondary | ICD-10-CM

## 2021-09-22 NOTE — Telephone Encounter (Signed)
Requesting: Klonopin 0.5 MG Contract: n/a UDS: 03/25/21 Last Visit: 03/25/21 Next Visit: not scheduled Last Refill: 03/25/21  Please Advise

## 2021-09-22 NOTE — Progress Notes (Signed)
[facility] [place] at [facility] [place], [place],  [id number] [phone]  Date:  09/27/2021   Name:  Patrick [name]   DOB:  25-Aug-1950   MRN:  [id number]  PCP:  Patrick Mclean, MD    Chief Complaint: Follow-up (Concerns/ questions: would like to make sure all vaccines are utd)   History of Present Illness:  Patrick [name] is a 72 y.o. very pleasant male patient who presents with the following:  Seen today for follow-up visit Most recent visit with myself was in January- history of sleep apnea, HTN, headache, GERD, BPH  He uses hydrocodone occasionally for various musculoskeletal pains Also using clonazepam At last visit he wanted to cut down some of his medicines, I suggested try tapering Topamax for migraine prophylaxis, could also reduce Lyrica and trazodone He is no longer taking topamax - he is still using lyrica and trazodone however  Most recent fill of hydrocodone April 12, 30 pills-he can use a refill today Labs done in January, CMP normal, lipids favorable, CBC normal except for thrombocytopenia, PSA normal  He had a knee scope in January; he does feel like this improved his knee pain and function  IUTD- discussed doing a 2nd bivalent booster at his convenience He is doing some work around the house and trying to stay active He does some walking for exercise  His daughter is due with her 3rd child this fall  Patient Active Problem List   Diagnosis Date Noted   Abnormal CT scan of lung 10/29/2020   Chronic bronchitis ([place]) 10/29/2020   Urinary frequency    Recurrent canker sores    Primary snoring    OSA (obstructive sleep apnea)    Morton's neuroma    Lower back pain    Insomnia    Hypertension    History of migraine    Herpes simplex type 1 infection    GERD (gastroesophageal reflux disease)    Headache    Fibromyalgia    Emphysema of lung ([place])    Dysphagia    Depression    BPH  (benign prostatic hyperplasia)    Basal cell carcinoma    Arthritis    Anxiety    Dizziness 11/29/2019   Acquired mallet deformity of finger of right hand 11/25/2019   Chronic cough 02/09/2019   Tear of right rotator cuff 12/13/2017   Right rotator cuff tendinitis 01/10/2017   Hearing loss 12/15/2016   Chronic fatigue 10/09/2015   Generalized anxiety disorder 10/09/2015   Memory loss 10/09/2015   Chronic SI joint pain 05/25/2015   Recurrent major depressive disorder, in partial remission ([place]) 04/30/2015   Obstructive sleep apnea of adult 02/09/2015   Lumbar facet joint pain 04/09/2014   Benign prostatic hyperplasia with urinary obstruction 11/18/2013   Acid reflux 11/18/2013   Degenerative arthritis of lumbar spine 11/18/2013   Headache, migraine 11/18/2013   Anal pain 11/18/2013   Atopic dermatitis 11/18/2013   Bladder neck contracture 11/18/2013   Constipation 11/18/2013   Crush injury to finger 11/18/2013   Deviated nasal septum 11/18/2013   Empty sella syndrome ([employer]) 11/18/2013   Abdominal pain 11/18/2013   Hematoma, subungual, third finger, left 11/18/2013   Hypertrophy of nasal turbinates 11/18/2013   Laceration of third finger, left 11/18/2013   Lateral epicondylitis 11/18/2013   Lymph nodes enlarged 11/18/2013   Nonallopathic lesion of sacral region 11/18/2013   OAB (overactive bladder) 11/18/2013   Open fracture  of distal phalanx of third finger of left hand 11/18/2013   Other intervertebral disc degeneration, lumbar region 11/18/2013   External hemorrhoids with other complication 79/89/2119   Restless leg 05/27/2009    Past Medical History:  Diagnosis Date   Anxiety    Arthritis    Basal cell carcinoma    BPH (benign prostatic hyperplasia)    Depression    Dysphagia    Emphysema of lung ([facility])    Fibromyalgia    GERD (gastroesophageal reflux disease)    otc  tums   Headache    migraines   Herpes simplex type 1 infection    History of migraine     Hypertension    no longer on medication   Insomnia    Lower back pain    Morton's neuroma    OSA (obstructive sleep apnea)    Primary snoring    Recurrent canker sores    Urinary frequency     Past Surgical History:  Procedure Laterality Date   EPIDIDYMECTOMY     for spermatocele   EYE SURGERY Bilateral    lasik eye surgery   FOOT SURGERY Left    INGUINAL HERNIA REPAIR Left    01/25/1999   MOUTH SURGERY     pancreatic surgery r/t trauma     SEPTOPLASTY N/A 02/09/2015   Procedure: SEPTOPLASTY;  Surgeon: Rozetta Nunnery, MD;  Location: [facility];  Service: ENT;  Laterality: N/A;   SKIN CANCER EXCISION     TONSILLECTOMY     TRANSURETHRAL INCISION OF PROSTATE     TURBINATE REDUCTION Bilateral 02/09/2015   Procedure: BILATERAL TURBINATE REDUCTION;  Surgeon: Rozetta Nunnery, MD;  Location: [facility];  Service: ENT;  Laterality: Bilateral;   UVULOPALATOPHARYNGOPLASTY N/A 02/09/2015   Procedure: UVULOPALATOPHARYNGOPLASTY (UPPP);  Surgeon: Rozetta Nunnery, MD;  Location: [facility];  Service: ENT;  Laterality: N/A;    Social History   Tobacco Use   Smoking status: Former    Packs/day: 2.00    Years: 30.00    Total pack years: 60.00    Types: Cigarettes, Pipe, Cigars    Start date: 03/1967    Quit date: 10/29/2005    Years since quitting: 15.9   Smokeless tobacco: Never  Substance Use Topics   Alcohol use: Yes    Alcohol/week: 1.0 standard drink of alcohol    Types: 1 Cans of beer per week    Comment: 1 beer every other day   Drug use: No    Family History  Problem Relation Age of Onset   Coronary artery disease Mother    Stroke Mother    Hypertension Father    Anxiety disorder Brother    Skin cancer Brother    Stroke Brother     No Known Allergies  Medication list has been reviewed and updated.  Current Outpatient Medications on File Prior to Visit  Medication Sig Dispense Refill   benzonatate (TESSALON) 200 MG capsule Take 1 capsule (200 mg total) by  mouth 3 (three) times daily as needed for cough. 60 capsule 2   clonazePAM (KLONOPIN) 0.5 MG tablet TAKE ONE TO TWO TABLETS BY MOUTH EVERY NIGHT AT BEDTIME AS NEEDED FOR SLEEP 180 tablet 0   famotidine (PEPCID) 20 MG tablet TAKE 1 TABLET BY MOUTH ONCE TO TWICE A DAY AS NEEDED FOR TO CONTROL GERD 180 tablet 3   fluticasone (FLONASE) 50 MCG/ACT nasal spray Place 2 sprays into both nostrils daily. 43 g 3   HYDROcodone-acetaminophen (NORCO/VICODIN) 5-325  MG tablet Take 1 tablet by mouth every 8 (eight) hours as needed for moderate pain. 30 tablet 0   indomethacin (INDOCIN) 25 MG capsule Take 1 to 2 capsules three times daily as needed for headache. 20 capsule 0   losartan (COZAAR) 50 MG tablet TAKE ONE TABLET BY MOUTH DAILY 90 tablet 3   Melatonin 5 MG TABS Take 5 mg by mouth at bedtime.     methylphenidate (RITALIN) 20 MG tablet Take 1 tablet (20 mg total) by mouth 2 (two) times daily. Use as needed for chronic fatigue 30 tablet 0   montelukast (SINGULAIR) 10 MG tablet TAKE ONE TABLET BY MOUTH EVERY NIGHT AT BEDTIME 90 tablet 3   pantoprazole (PROTONIX) 40 MG tablet TAKE ONE TABLET BY MOUTH DAILY 90 tablet 0   pregabalin (LYRICA) 100 MG capsule TAKE TWO CAPSULES BY MOUTH DAILY AT BEDTIME 180 capsule 3   rosuvastatin (CRESTOR) 10 MG tablet TAKE ONE TABLET BY MOUTH DAILY 30 tablet 3   sildenafil (VIAGRA) 100 MG tablet Take 0.5-1 tablets (50-100 mg total) by mouth daily as needed for erectile dysfunction. 6 tablet 11   sucralfate (CARAFATE) 1 g tablet Take 1 tablet (1 g total) by mouth 4 (four) times daily -  with meals and at bedtime. 40 tablet 0   SUMAtriptan (IMITREX) 100 MG tablet Take 1 tablet earliest onset of migraine.  May repeat in 2 hours if headache persists or recurs.  Maximum 2 tablets in 24 hours. 10 tablet 5   tolterodine (DETROL LA) 4 MG 24 hr capsule Take 1 capsule (4 mg total) by mouth daily. 90 capsule 3   topiramate (TOPAMAX) 100 MG tablet Take 1 tablet (100 mg total) by mouth daily.  90 tablet 1   traZODone (DESYREL) 150 MG tablet TAKE ONE TABLET BY MOUTH EVERY NIGHT AT BEDTIME AS NEEDED FOR SLEEP 30 tablet 5   vitamin B-12 (CYANOCOBALAMIN) 500 MCG tablet Take 500 mcg by mouth daily.     metoprolol tartrate (LOPRESSOR) 100 MG tablet Take 1 tablet (100 mg total) by mouth once for 1 dose. Take two hours prior to your cardiac CT 1 tablet 0   No current facility-administered medications on file prior to visit.    Review of Systems:  As per HPI- otherwise negative.   Physical Examination: Vitals:   09/27/21 1302  BP: 114/80  Pulse: 63  Resp: 18  Temp: 97.6 F (36.4 C)  SpO2: 98%   Vitals:   09/27/21 1302  Weight: 190 lb 3.2 oz (86.3 kg)  Height: '5\' 10"'$  (1.778 m)   Body mass index is 27.29 kg/m. Ideal Body Weight: Weight in (lb) to have BMI = 25: 173.9  GEN: no acute distress.  Looks well, minimal overweight HEENT: Atraumatic, Normocephalic.  Ears and Nose: No external deformity. CV: RRR, No M/G/R. No JVD. No thrill. No extra heart sounds. PULM: CTA B, no wheezes, crackles, rhonchi. No retractions. No resp. distress. No accessory muscle use. ABD: S, NT, ND, +BS. No rebound. No HSM. EXTR: No c/c/e PSYCH: Normally interactive. Conversant.    Assessment and Plan: Thrombocytopenia ([place]) - Plan: Pathologist smear review, CBC, CANCELED: CBC  Osteoarthritis of lumbar spine, unspecified spinal osteoarthritis complication status - Plan: HYDROcodone-acetaminophen (NORCO/VICODIN) 5-325 MG tablet  Sciatica, unspecified laterality - Plan: HYDROcodone-acetaminophen (NORCO/VICODIN) 5-325 MG tablet  Chronic fatigue - Plan: methylphenidate (RITALIN) 20 MG tablet  Chronic pain syndrome - Plan: meloxicam (MOBIC) 15 MG tablet  Following up today.  Refilled methylphenidate for his chronic fatigue.  Also refilled meloxicam and hydrocodone for chronic pain UDS is up-to-date We had noted thrombocytopenia recently, recheck CBC and order peripheral smear today Plan for  follow-up in 6 months assuming all is well  Signed Lamar Blinks, MD

## 2021-09-23 ENCOUNTER — Other Ambulatory Visit: Payer: Self-pay | Admitting: Family Medicine

## 2021-09-23 DIAGNOSIS — I10 Essential (primary) hypertension: Secondary | ICD-10-CM

## 2021-09-27 ENCOUNTER — Ambulatory Visit (INDEPENDENT_AMBULATORY_CARE_PROVIDER_SITE_OTHER): Payer: Medicare Other | Admitting: Family Medicine

## 2021-09-27 ENCOUNTER — Encounter: Payer: Self-pay | Admitting: Family Medicine

## 2021-09-27 VITALS — BP 114/80 | HR 63 | Temp 97.6°F | Resp 18 | Ht 70.0 in | Wt 190.2 lb

## 2021-09-27 DIAGNOSIS — D696 Thrombocytopenia, unspecified: Secondary | ICD-10-CM | POA: Diagnosis not present

## 2021-09-27 DIAGNOSIS — G894 Chronic pain syndrome: Secondary | ICD-10-CM

## 2021-09-27 DIAGNOSIS — M5430 Sciatica, unspecified side: Secondary | ICD-10-CM | POA: Diagnosis not present

## 2021-09-27 DIAGNOSIS — R5382 Chronic fatigue, unspecified: Secondary | ICD-10-CM

## 2021-09-27 DIAGNOSIS — M47816 Spondylosis without myelopathy or radiculopathy, lumbar region: Secondary | ICD-10-CM

## 2021-09-27 MED ORDER — METHYLPHENIDATE HCL 20 MG PO TABS: 20.0000 mg | ORAL_TABLET | Freq: Two times a day (BID) | ORAL | 0 refills | Status: DC

## 2021-09-27 MED ORDER — MELOXICAM 15 MG PO TABS: 15.0000 mg | ORAL_TABLET | Freq: Every day | ORAL | 3 refills | Status: DC

## 2021-09-27 MED ORDER — HYDROCODONE-ACETAMINOPHEN 5-325 MG PO TABS: 1.0000 | ORAL_TABLET | Freq: Three times a day (TID) | ORAL | 0 refills | Status: DC | PRN

## 2021-09-27 NOTE — Patient Instructions (Signed)
Good to see you again today- I will be in touch with your labs Please see me in about 6 months, let me know about any refills you need in the meantime  JC

## 2021-09-28 ENCOUNTER — Encounter: Payer: Self-pay | Admitting: Pulmonary Disease

## 2021-09-28 ENCOUNTER — Telehealth (INDEPENDENT_AMBULATORY_CARE_PROVIDER_SITE_OTHER): Payer: Medicare Other | Admitting: Pulmonary Disease

## 2021-09-28 ENCOUNTER — Encounter: Payer: Self-pay | Admitting: Family Medicine

## 2021-09-28 DIAGNOSIS — R9389 Abnormal findings on diagnostic imaging of other specified body structures: Secondary | ICD-10-CM | POA: Diagnosis not present

## 2021-09-28 LAB — CBC
HCT: 42.8 % (ref 38.5–50.0)
Hemoglobin: 14.7 g/dL (ref 13.2–17.1)
MCH: 32 pg (ref 27.0–33.0)
MCHC: 34.3 g/dL (ref 32.0–36.0)
MCV: 93.2 fL (ref 80.0–100.0)
MPV: 11.4 fL (ref 7.5–12.5)
Platelets: 155 10*3/uL (ref 140–400)
RBC: 4.59 10*6/uL (ref 4.20–5.80)
RDW: 12.6 % (ref 11.0–15.0)
WBC: 3.9 10*3/uL (ref 3.8–10.8)

## 2021-09-28 LAB — PATHOLOGIST SMEAR REVIEW

## 2021-09-28 NOTE — Progress Notes (Signed)
Virtual Visit via Telephone Note  I connected with Patrick [name] on 09/28/21 at  3:45 PM EDT by telephone and verified that I am speaking with the correct person using two identifiers.  Location: Patient: Home Provider: [facility]   I discussed the limitations, risks, security and privacy concerns of performing an evaluation and management service by telephone and the availability of in person appointments. I also discussed with the patient that there may be a patient responsible charge related to this service. The patient expressed understanding and agreed to proceed.   I discussed the assessment and treatment plan with the patient. The patient was provided an opportunity to ask questions and all were answered. The patient agreed with the plan and demonstrated an understanding of the instructions.   The patient was advised to call back or seek an in-person evaluation if the symptoms worsen or if the condition fails to improve as anticipated.  I provided 20 minutes of non-face-to-face time during this encounter.   Marlan Steward Rodman Pickle, MD   Subjective:   PATIENT ID: Patrick [name] GENDER: male DOB: 1950/07/14, MRN: [id number]    Chief Complaint  Patient presents with   Follow-up    Discuss CT results     Reason for Visit: Follow-up  [name] is a 71 year old former smoker with GAD, moderate OSA and acid reflux who presents for follow-up.  Synopsis:  2020- Seen once for long standing dry hacking cough that occurs several times a day. Worse in the mornings. Denies wheezing. Triggered by cleaning solutions and wipes and strong scents. Can also be triggered when exposed to dust, grass, leaves. Also reports episodes of his throat closing up associated with sensations of feeling like he cannot breath. He has been taking singulair with some relief. He tried a rescue inhaler for a few weeks but did not notice a difference. On ROS, he reports a hx of  reflux described as retrosternal burning. He is otherwise fairly active. He walks two miles daily. Only has shortness of breath with heavy exertion such as running.  2022- Returned for chronic cough in May.  09/29/20 Since our last visit he reports his cough has nearly resolved. He had previously tried prednisone, Breo and albuterol. He was restarted on Breo on the last visit but did not have any effect. His cough has self-resolved and no longer is an issue. His work-up for his cough included CT and PFT which we will discuss today. His wife is present as well. Denies significant shortness of breath or wheezing or any other respiratory issues. He does have concern about his lung due to his family history of ILD however unclear if this was work-related vs genetic.  09/28/21 Denies any current cough, shortness of breath or wheezing. Not on any bronchodilators.  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 Previously smoked 2 ppd Father had ?ILD after working at [employer]. Exposure to chemicals, powders   Environmental exposures:  Prior home reno projects including insulation and flooring. 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  Past Medical History:  Diagnosis Date   Anxiety    Arthritis    Basal cell carcinoma    BPH (benign prostatic hyperplasia)    Depression    Dysphagia    Emphysema of lung (H[facility])    Fibromyalgia    GERD (gastroesophageal reflux disease)    otc  tums  Headache    migraines   Herpes simplex type 1 infection    History of migraine    Hypertension    no longer on medication   Insomnia    Lower back pain    Morton's neuroma    OSA (obstructive sleep apnea)    Primary snoring    Recurrent canker sores    Urinary frequency     No Known Allergies   Outpatient Medications Prior to Visit  Medication Sig Dispense Refill    benzonatate (TESSALON) 200 MG capsule Take 1 capsule (200 mg total) by mouth 3 (three) times daily as needed for cough. 60 capsule 2   clonazePAM (KLONOPIN) 0.5 MG tablet TAKE ONE TO TWO TABLETS BY MOUTH EVERY NIGHT AT BEDTIME AS NEEDED FOR SLEEP 180 tablet 0   famotidine (PEPCID) 20 MG tablet TAKE 1 TABLET BY MOUTH ONCE TO TWICE A DAY AS NEEDED FOR TO CONTROL GERD 180 tablet 3   fluticasone (FLONASE) 50 MCG/ACT nasal spray Place 2 sprays into both nostrils daily. 43 g 3   HYDROcodone-acetaminophen (NORCO/VICODIN) 5-325 MG tablet Take 1 tablet by mouth every 8 (eight) hours as needed for moderate pain. 30 tablet 0   indomethacin (INDOCIN) 25 MG capsule Take 1 to 2 capsules three times daily as needed for headache. 20 capsule 0   losartan (COZAAR) 50 MG tablet TAKE ONE TABLET BY MOUTH DAILY 90 tablet 3   Melatonin 5 MG TABS Take 5 mg by mouth at bedtime.     meloxicam (MOBIC) 15 MG tablet Take 1 tablet (15 mg total) by mouth daily. 90 tablet 3   methylphenidate (RITALIN) 20 MG tablet Take 1 tablet (20 mg total) by mouth 2 (two) times daily. Use as needed for chronic fatigue 30 tablet 0   metoprolol tartrate (LOPRESSOR) 100 MG tablet Take 1 tablet (100 mg total) by mouth once for 1 dose. Take two hours prior to your cardiac CT 1 tablet 0   montelukast (SINGULAIR) 10 MG tablet TAKE ONE TABLET BY MOUTH EVERY NIGHT AT BEDTIME 90 tablet 3   pantoprazole (PROTONIX) 40 MG tablet TAKE ONE TABLET BY MOUTH DAILY 90 tablet 0   pregabalin (LYRICA) 100 MG capsule TAKE TWO CAPSULES BY MOUTH DAILY AT BEDTIME 180 capsule 3   rosuvastatin (CRESTOR) 10 MG tablet TAKE ONE TABLET BY MOUTH DAILY 30 tablet 3   sildenafil (VIAGRA) 100 MG tablet Take 0.5-1 tablets (50-100 mg total) by mouth daily as needed for erectile dysfunction. 6 tablet 11   sucralfate (CARAFATE) 1 g tablet Take 1 tablet (1 g total) by mouth 4 (four) times daily -  with meals and at bedtime. 40 tablet 0   SUMAtriptan (IMITREX) 100 MG tablet Take 1  tablet earliest onset of migraine.  May repeat in 2 hours if headache persists or recurs.  Maximum 2 tablets in 24 hours. 10 tablet 5   tolterodine (DETROL LA) 4 MG 24 hr capsule Take 1 capsule (4 mg total) by mouth daily. 90 capsule 3   topiramate (TOPAMAX) 100 MG tablet Take 1 tablet (100 mg total) by mouth daily. 90 tablet 1   traZODone (DESYREL) 150 MG tablet TAKE ONE TABLET BY MOUTH EVERY NIGHT AT BEDTIME AS NEEDED FOR SLEEP 30 tablet 5   vitamin B-12 (CYANOCOBALAMIN) 500 MCG tablet Take 500 mcg by mouth daily.     No facility-administered medications prior to visit.    Review of Systems  Constitutional:  Negative for chills, diaphoresis, fever, malaise/fatigue and weight loss.  HENT:  Negative for congestion.   Respiratory:  Negative for cough, hemoptysis, sputum production, shortness of breath and wheezing.   Cardiovascular:  Negative for chest pain, palpitations and leg swelling.     Objective:   There were no vitals filed for this visit.     Physical Exam: General: No acute distress on the phone. Able to speak in full sentences  Data Reviewed:  Imaging: CT Chest Lung Screen 10/15/18 - Centrilobular emphysema, RLL calcified granuloma. Subpleural left base nodule, unchanged CT Chest Lung Screen 12/19/19 - Centrilobular emphysema. Unchanged subpleural left base nodule. RLL calcified granuloma CT Coronary - Lung parenchyma with minimal RML and lingula scarring and bilateral atelectasis. Enlarged pulmonary trunk. CT 09/24/20 - Mild patchy ground glass and septal thickening in the lung bases with LLL scarring. No honeycombing or traction bronchiectasis. 44 subpleural nodule in left lower lobe.  CT Chest 09/28/21 - Normal parenchyma. No significant GGO, septal thickening, subpleural reticulation, traction bronchiectasis or honeycombing present. Minimal scattered scarring.   PFT: 09/29/20 FVC 4.67 (95%) FEV1 3.91 (107%) Ratio 82  TLC 89% DLCO 127% Interpretation: Normal  spirometry  Labs: CBC    Component Value Date/Time   WBC 3.9 09/27/2021 1329   RBC 4.59 09/27/2021 1329   HGB 14.7 09/27/2021 1329   HGB 15.2 04/02/2021 0836   HCT 42.8 09/27/2021 1329   PLT 155 09/27/2021 1329   PLT 128 (L) 04/02/2021 0836   MCV 93.2 09/27/2021 1329   MCH 32.0 09/27/2021 1329   MCHC 34.3 09/27/2021 1329   RDW 12.6 09/27/2021 1329   LYMPHSABS 1.5 04/02/2021 0836   MONOABS 0.5 04/02/2021 0836   EOSABS 0.2 04/02/2021 0836   BASOSABS 0.0 04/02/2021 0836   BMET    Component Value Date/Time   NA 141 04/02/2021 0836   NA 143 07/03/2020 0827   K 3.9 04/02/2021 0836   CL 106 04/02/2021 0836   CO2 29 04/02/2021 0836   GLUCOSE 86 04/02/2021 0836   BUN 18 04/02/2021 0836   BUN 16 07/03/2020 0827   CREATININE 1.12 04/02/2021 0836   CALCIUM 9.8 04/02/2021 0836   GFRNONAA >60 04/02/2021 0836   GFRAA >60 09/26/2015 1505   Absolute eos 100  Sleep study: 08/17/14 Split night - AHI 15.8 08/28/14 CPAP titration - Recommend 10cm H20  Echocardiogram EF 55-60%, no WMA or valvular abnormalities    Assessment & Plan:   Discussion: 71 year old male former smoker with moderate OSA, GAD and reflux who presents for follow-up. Initially presented for chronic cough with negative work-up. However he returned in 2022 with cough and questionable CT imaging concerning for ILD. Repeat imaging in 09/2021 reviewed and no evidence of ILD with overall normal parenchyma. In setting of prior normal PFTs, his prior findings were likely infectious/inflammatory in setting of preceding illness at that time. He is currently asymptomatic and does not require any additional follow-up.  Abnormal CT, likely post-infectious/inflammatory with resolution --Reviewed and normal --No further imaging indicate   Health Maintenance Immunization History  Administered Date(s) Administered   Fluad Quad(high Dose 65+) 12/18/2019, 12/07/2020   Influenza, High Dose Seasonal PF 12/03/2018    Influenza,inj,Quad PF,6+ Mos 12/15/2016   Influenza-Unspecified 01/12/2018   Moderna SARS-COV2 Booster Vaccination 06/22/2020   Moderna Sars-Covid-2 Vaccination 04/15/2019, 05/12/2019   Pfizer Covid-19 Vaccine Bivalent Booster 6yr & up 12/07/2020   Pneumococcal Conjugate-13 05/03/2018   Pneumococcal Polysaccharide-23 07/31/2019   Tdap 07/20/2016   Zoster Recombinat (Shingrix) 07/28/2016, 10/28/2016   Zoster, Live 03/14/2013   CT  Lung Screen - not qualified. >15 since quit smoking  No orders of the defined types were placed in this encounter.  No orders of the defined types were placed in this encounter.   No follow-ups on file.   I have spent a total time of 33-minutes on the day of the appointment reviewing prior documentation, coordinating care and discussing medical diagnosis and plan with the patient/family. Past medical history, allergies, medications were reviewed. Pertinent imaging, labs and tests included in this note have been reviewed and interpreted independently by me.  [place], MD [facility] 09/28/2021 6:57 PM  Office Number [phone]

## 2021-09-28 NOTE — Patient Instructions (Signed)
Abnormal CT, likely post-infectious/inflammatory with resolution --Reviewed and normal --No further imaging indicate

## 2021-10-09 IMAGING — CT CT HEART MORP W/ CTA COR W/ SCORE W/ CA W/CM &/OR W/O CM
4 of 7 series · 8 of 20 positions shown, 9 images · non-contrast
Comparison: 12/19/2019.
COMPARISON: 12/19/2019.

Addendum:
EXAM:
OVER-READ INTERPRETATION  CT CHEST

The following report is an over-read performed by radiologist Dr.
Michilin Suncion [REDACTED] on 07/08/2020. This
over-read does not include interpretation of cardiac or coronary
anatomy or pathology. The coronary calcium score/coronary CTA
interpretation by the cardiologist is attached.
TECHNIQUE: The patient was scanned on a [employer] scanner.

[Series 6: best diast 72 % · axial · 0.39mm/px · z∈[+1343,+1388]mm · 2 of 333 slices shown]
[im 111/333  vessel]
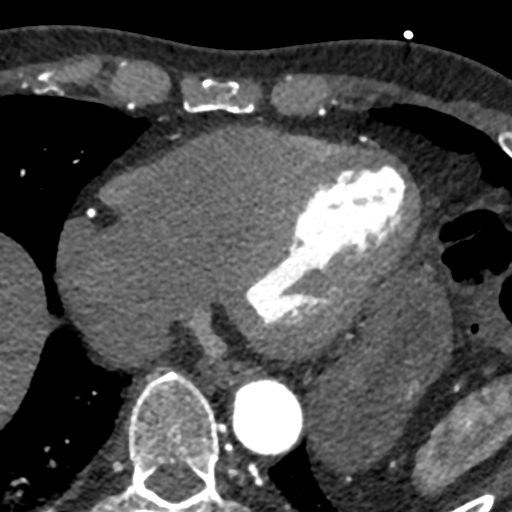
[im 222/333  vessel]
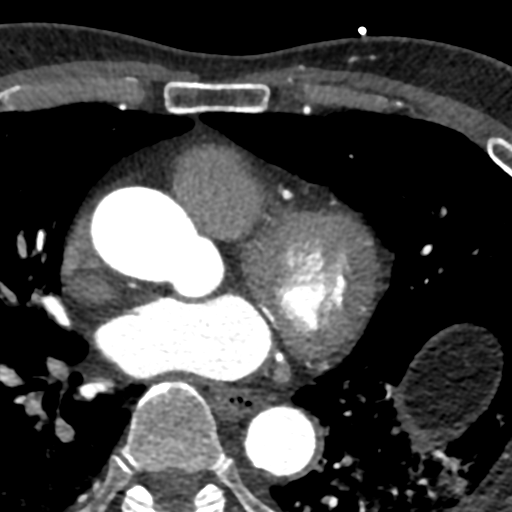

[Series 7: best syst · axial · 0.39mm/px · z∈[+1343,+1388]mm · 2 of 333 slices shown, 3 images]
[im 111/333  vessel]
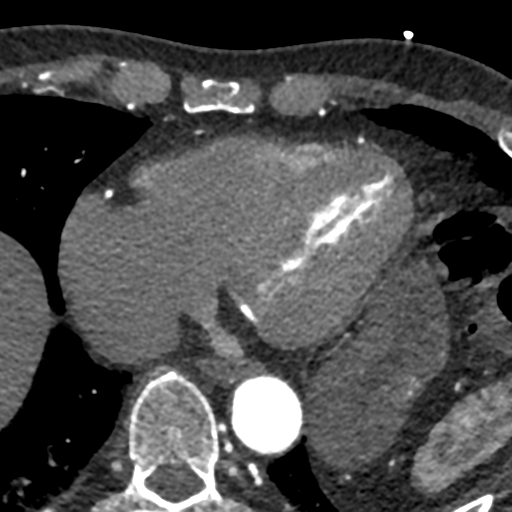
[im 111/333  lung]
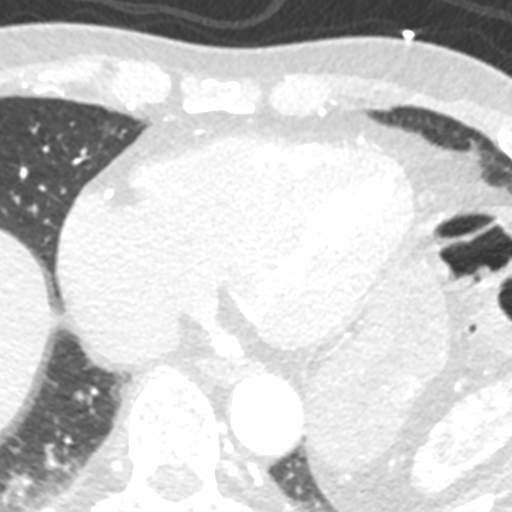
[im 222/333  vessel]
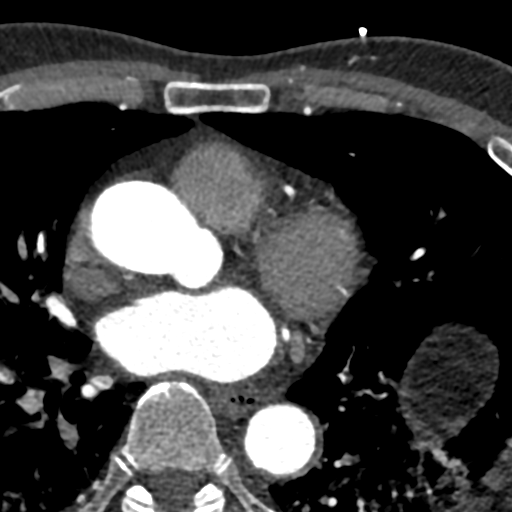

[Series 9: ts diast sharp 72 % · axial · 0.39mm/px · z∈[+1343,+1388]mm · 2 of 333 slices shown]
[im 111/333  lung]
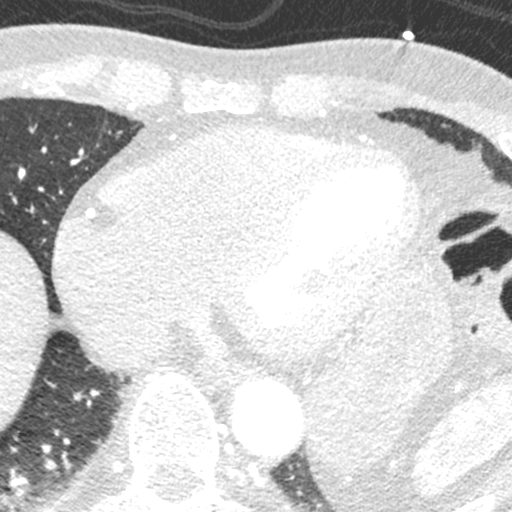
[im 222/333  lung]
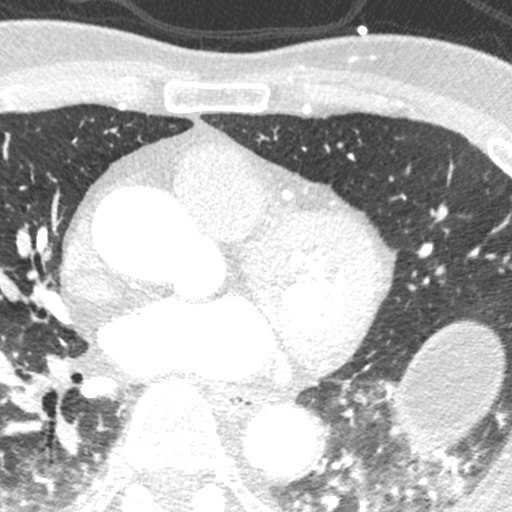

[Series 10: ts syst sharp · axial · 0.39mm/px · z∈[+1343,+1388]mm · 2 of 333 slices shown]
[im 111/333  lung]
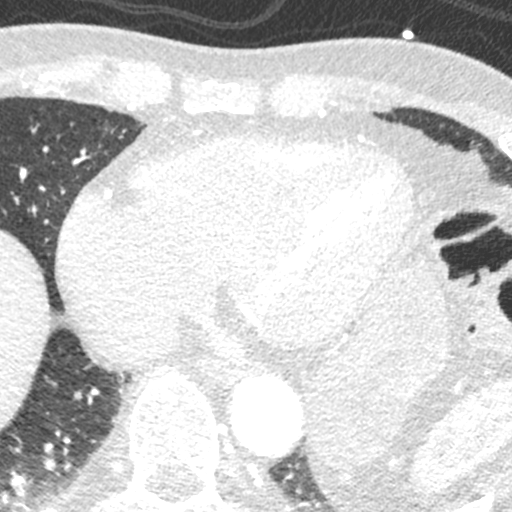
[im 222/333  lung]
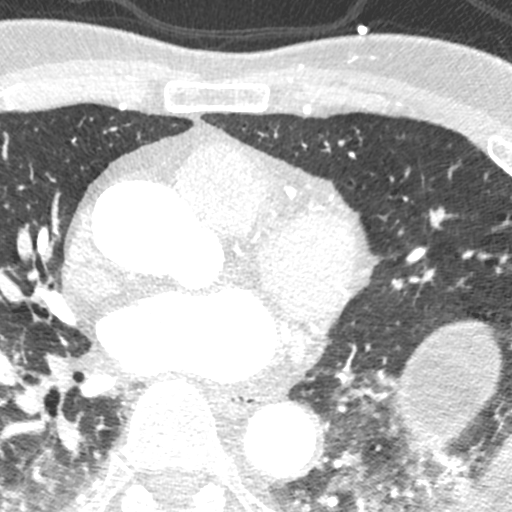

[8 of 20 positions shown; findings below may reference images not displayed]

FINDINGS: Vascular: Pulmonic trunk and heart are enlarged. No pericardial
effusion.

Mediastinum/Nodes: None.

Lungs/Pleura: Dependent atelectasis bilaterally. Minimal scarring in
the right middle lobe and lingula. No pleural fluid. Visualized
airway is unremarkable.

Upper Abdomen: None.

Musculoskeletal: Mild degenerative changes in the spine.
IMPRESSION: Enlarged pulmonic trunk, indicative of pulmonary arterial
hypertension.

EXAM:
Cardiac/Coronary  CT
FINDINGS: A 120 kV prospective scan was triggered in the descending thoracic
aorta at 111 HU's. Axial non-contrast 3 mm slices were carried out
through the heart. The data set was analyzed on a dedicated work
station and scored using the Agatson method. Gantry rotation speed
was 250 msecs and collimation was .6 mm. No beta blockade and 0.8 mg
of sl NTG was given. The 3D data set was reconstructed in 5%
intervals of the 67-82 % of the R-R cycle. Diastolic phases were
analyzed on a dedicated work station using MPR, MIP and VRT modes.
The patient received 80 cc of contrast.

Aorta:  Normal size.  No calcifications.  No dissection.

Aortic Valve:  Trileaflet.  No calcifications.

Coronary Arteries:  Normal coronary origin.  Right dominance.

RCA is a large dominant artery that gives rise to PDA and PLVB.
There is no plaque.

Left main is a large artery that gives rise to LAD and LCX arteries.
There is no plaque.

LAD is a large vessel that gives rise to 2 large D1 and D2 branches
and a moderate sized D3 branch. There is mild calcified plaque in
the proximal LAD with associated stenosis of 25-49%.

LCX is a non-dominant artery that gives rise to one small OM1 and a
large OM2 branch. There is no plaque.

Other findings:

Normal pulmonary vein drainage into the left atrium.

Normal let atrial appendage without a thrombus.

Normal size of the pulmonary artery.
IMPRESSION: 1. Coronary calcium score of 111. This was 52nd percentile for age
and sex matched control.

2.  Normal coronary origin with right dominance.

3.  Mild atherosclerosis.  CAD RADS 2.

4.  Recommend preventive therapy and risk factor modification.

5.  This study has been sent for FFR flow analysis.

Eric Benjamin Sitchou

*** End of Addendum ***
EXAM:
OVER-READ INTERPRETATION  CT CHEST

The following report is an over-read performed by radiologist Dr.
Michilin Suncion [REDACTED] on 07/08/2020. This
over-read does not include interpretation of cardiac or coronary
anatomy or pathology. The coronary calcium score/coronary CTA
interpretation by the cardiologist is attached.
FINDINGS: Vascular: Pulmonic trunk and heart are enlarged. No pericardial
effusion.

Mediastinum/Nodes: None.

Lungs/Pleura: Dependent atelectasis bilaterally. Minimal scarring in
the right middle lobe and lingula. No pleural fluid. Visualized
airway is unremarkable.

Upper Abdomen: None.

Musculoskeletal: Mild degenerative changes in the spine.
IMPRESSION: Enlarged pulmonic trunk, indicative of pulmonary arterial
hypertension.

## 2021-11-01 ENCOUNTER — Other Ambulatory Visit: Payer: Self-pay

## 2021-11-01 MED ORDER — PANTOPRAZOLE SODIUM 40 MG PO TBEC: 40.0000 mg | DELAYED_RELEASE_TABLET | Freq: Every day | ORAL | 1 refills | Status: DC

## 2021-11-05 ENCOUNTER — Other Ambulatory Visit: Payer: Self-pay | Admitting: Family Medicine

## 2021-11-05 ENCOUNTER — Encounter: Payer: Self-pay | Admitting: Family Medicine

## 2021-11-05 DIAGNOSIS — K219 Gastro-esophageal reflux disease without esophagitis: Secondary | ICD-10-CM

## 2021-11-14 ENCOUNTER — Other Ambulatory Visit: Payer: Self-pay

## 2021-11-14 ENCOUNTER — Encounter (HOSPITAL_BASED_OUTPATIENT_CLINIC_OR_DEPARTMENT_OTHER): Payer: Self-pay | Admitting: Emergency Medicine

## 2021-11-14 ENCOUNTER — Emergency Department (HOSPITAL_BASED_OUTPATIENT_CLINIC_OR_DEPARTMENT_OTHER): Payer: Medicare Other

## 2021-11-14 ENCOUNTER — Emergency Department (HOSPITAL_BASED_OUTPATIENT_CLINIC_OR_DEPARTMENT_OTHER)
Admission: EM | Admit: 2021-11-14 | Discharge: 2021-11-14 | Disposition: A | Discharge status: Home / Self Care | Payer: Medicare Other | Attending: Emergency Medicine | Admitting: Emergency Medicine

## 2021-11-14 DIAGNOSIS — R519 Headache, unspecified: Secondary | ICD-10-CM | POA: Insufficient documentation

## 2021-11-14 DIAGNOSIS — Z20822 Contact with and (suspected) exposure to covid-19: Secondary | ICD-10-CM | POA: Insufficient documentation

## 2021-11-14 LAB — CBC WITH DIFFERENTIAL/PLATELET
Abs Immature Granulocytes: 0.01 10*3/uL (ref 0.00–0.07)
Basophils Absolute: 0 10*3/uL (ref 0.0–0.1)
Basophils Relative: 1 %
Eosinophils Absolute: 0.2 10*3/uL (ref 0.0–0.5)
Eosinophils Relative: 5 %
HCT: 41.7 % (ref 39.0–52.0)
Hemoglobin: 14.2 g/dL (ref 13.0–17.0)
Immature Granulocytes: 0 %
Lymphocytes Relative: 33 %
Lymphs Abs: 1.2 10*3/uL (ref 0.7–4.0)
MCH: 31.6 pg (ref 26.0–34.0)
MCHC: 34.1 g/dL (ref 30.0–36.0)
MCV: 92.7 fL (ref 80.0–100.0)
Monocytes Absolute: 0.4 10*3/uL (ref 0.1–1.0)
Monocytes Relative: 11 %
Neutro Abs: 1.8 10*3/uL (ref 1.7–7.7)
Neutrophils Relative %: 50 %
Platelets: 132 10*3/uL — ABNORMAL LOW (ref 150–400)
RBC: 4.5 MIL/uL (ref 4.22–5.81)
RDW: 12.3 % (ref 11.5–15.5)
WBC: 3.7 10*3/uL — ABNORMAL LOW (ref 4.0–10.5)
nRBC: 0 % (ref 0.0–0.2)

## 2021-11-14 LAB — BASIC METABOLIC PANEL
Anion gap: 6 (ref 5–15)
BUN: 20 mg/dL (ref 8–23)
CO2: 27 mmol/L (ref 22–32)
Calcium: 8.9 mg/dL (ref 8.9–10.3)
Chloride: 108 mmol/L (ref 98–111)
Creatinine, Ser: 0.88 mg/dL (ref 0.61–1.24)
GFR, Estimated: >60 mL/min (ref >60)
Glucose, Bld: 98 mg/dL (ref 70–99)
Potassium: 3.8 mmol/L (ref 3.5–5.1)
Sodium: 141 mmol/L (ref 135–145)

## 2021-11-14 LAB — SARS CORONAVIRUS 2 BY RT PCR: SARS Coronavirus 2 by RT PCR: NEGATIVE

## 2021-11-14 MED ORDER — SODIUM CHLORIDE 0.9 % IV BOLUS
500.0000 mL | Freq: Once | INTRAVENOUS | Status: AC
  Administered 2021-11-14: 500 mL via INTRAVENOUS

## 2021-11-14 MED ORDER — KETOROLAC TROMETHAMINE 15 MG/ML IJ SOLN
15.0000 mg | Freq: Once | INTRAMUSCULAR | Status: AC
  Administered 2021-11-14: 15 mg via INTRAVENOUS
  Filled 2021-11-14: qty 1

## 2021-11-14 NOTE — ED Triage Notes (Signed)
Pt reports episode of blurred vision x 30 minutes yesterday; has had HA since that is not responding to migraine meds

## 2021-11-14 NOTE — ED Provider Notes (Signed)
[facility]PART[facility] Provider Note   CSN: [id number] Arrival date & time: 11/14/21  1850     History  Chief Complaint  Patient presents with   Headache    Patrick [name] is a 71 y.o. male.  Patient is a 71 year old male who presents with headache.  He has a noted history of migraines.  He states that yesterday he had an episode of blurry vision in both of his eyes.  He said he saw some squiggly lines around his eyes in both of his eyes.  He has had this happen before but not usually associated with a headache.  It lasted about an hour and sometime during that time he developed a headache which was gradual in onset and mostly to the left side of his head and around his eye.  He does not report any vision changes since yesterday.  He currently has normal vision.  No pain to the eye.  No pain on eye movements.  No nausea or vomiting.  He said the headache was worse yesterday.  He took some Tylenol and his migraine medicine although he does not know the name of the medicine that he took and it eased off but he still has a persistent headache.  It is dull currently.  He is not really able to tell me if this is similar to prior headaches he has had before.  He is somewhat of a poor historian.  He thinks that his normal migraines are all over his head but he does not really know if he has had headaches like this before.  No fevers.  No numbness or weakness to his extremities.  No speech deficits.       Home Medications Prior to Admission medications   Medication Sig Start Date End Date Taking? Authorizing Provider  benzonatate (TESSALON) 200 MG capsule Take 1 capsule (200 mg total) by mouth 3 (three) times daily as needed for cough. 05/14/20   Copland, Gay Filler, MD  clonazePAM (KLONOPIN) 0.5 MG tablet TAKE ONE TO TWO TABLETS BY MOUTH EVERY NIGHT AT BEDTIME AS NEEDED FOR SLEEP 09/22/21   Copland, Gay Filler, MD  famotidine (PEPCID) 20 MG tablet Take 1 tablet (20 mg total)  by mouth 2 (two) times daily as needed for heartburn. 11/05/21   Copland, Gay Filler, MD  fluticasone (FLONASE) 50 MCG/ACT nasal spray Place 2 sprays into both nostrils daily. 10/14/15   Copland, Gay Filler, MD  HYDROcodone-acetaminophen (NORCO/VICODIN) 5-325 MG tablet Take 1 tablet by mouth every 8 (eight) hours as needed for moderate pain. 09/27/21   Copland, Gay Filler, MD  indomethacin (INDOCIN) 25 MG capsule Take 1 to 2 capsules three times daily as needed for headache. 04/23/20   Pieter Partridge, DO  losartan (COZAAR) 50 MG tablet TAKE ONE TABLET BY MOUTH DAILY 09/23/21   Copland, Gay Filler, MD  Melatonin 5 MG TABS Take 5 mg by mouth at bedtime.    [provider]  meloxicam (MOBIC) 15 MG tablet Take 1 tablet (15 mg total) by mouth daily. 09/27/21   Copland, Gay Filler, MD  methylphenidate (RITALIN) 20 MG tablet Take 1 tablet (20 mg total) by mouth 2 (two) times daily. Use as needed for chronic fatigue 09/27/21   Copland, Gay Filler, MD  metoprolol tartrate (LOPRESSOR) 100 MG tablet Take 1 tablet (100 mg total) by mouth once for 1 dose. Take two hours prior to your cardiac CT 06/22/20 06/22/20  Richardo Priest, MD  montelukast [facility])  10 MG tablet TAKE ONE TABLET BY MOUTH EVERY NIGHT AT BEDTIME 05/10/21   Copland, Gay Filler, MD  pantoprazole (PROTONIX) 40 MG tablet Take 1 tablet (40 mg total) by mouth daily. 11/01/21   Copland, Gay Filler, MD  pregabalin (LYRICA) 100 MG capsule TAKE TWO CAPSULES BY MOUTH DAILY AT BEDTIME 08/24/21   Copland, Gay Filler, MD  rosuvastatin (CRESTOR) 10 MG tablet TAKE ONE TABLET BY MOUTH DAILY 04/07/21   Copland, Gay Filler, MD  sildenafil (VIAGRA) 100 MG tablet Take 0.5-1 tablets (50-100 mg total) by mouth daily as needed for erectile dysfunction. 03/11/21   Copland, Gay Filler, MD  sucralfate (CARAFATE) 1 g tablet Take 1 tablet (1 g total) by mouth 4 (four) times daily -  with meals and at bedtime. 07/13/20   Copland, Gay Filler, MD  SUMAtriptan (IMITREX) 100 MG tablet Take 1  tablet earliest onset of migraine.  May repeat in 2 hours if headache persists or recurs.  Maximum 2 tablets in 24 hours. 04/23/20   Pieter Partridge, DO  tolterodine (DETROL LA) 4 MG 24 hr capsule Take 1 capsule (4 mg total) by mouth daily. 04/04/19   Copland, Gay Filler, MD  topiramate (TOPAMAX) 100 MG tablet Take 1 tablet (100 mg total) by mouth daily. 11/19/20   Copland, Gay Filler, MD  traZODone (DESYREL) 150 MG tablet TAKE ONE TABLET BY MOUTH EVERY NIGHT AT BEDTIME AS NEEDED FOR SLEEP 08/17/21   Copland, Gay Filler, MD  vitamin B-12 (CYANOCOBALAMIN) 500 MCG tablet Take 500 mcg by mouth daily.    [provider]      Allergies    Patient has no known allergies.    Review of Systems   Review of Systems  Constitutional:  Negative for chills, diaphoresis, fatigue and fever.  HENT:  Negative for congestion and rhinorrhea.   Eyes:  Positive for visual disturbance. Negative for photophobia, pain and redness.  Respiratory:  Negative for cough, chest tightness and shortness of breath.   Cardiovascular:  Negative for chest pain and leg swelling.  Gastrointestinal:  Negative for abdominal pain, blood in stool, diarrhea, nausea and vomiting.  Genitourinary:  Negative for difficulty urinating, flank pain, frequency and hematuria.  Musculoskeletal:  Negative for arthralgias and back pain.  Skin:  Negative for rash.  Neurological:  Positive for headaches. Negative for dizziness, speech difficulty, weakness and numbness.    Physical Exam Updated Vital Signs BP 123/83   Pulse 65   Temp 98.2 F (36.8 C)   Resp 18   Ht 6' (1.829 m)   Wt 81.6 kg   SpO2 97%   BMI 24.41 kg/m  Physical Exam Constitutional:      Appearance: He is well-developed.  HENT:     Head: Normocephalic and atraumatic.  Eyes:     General: No visual field deficit.    Extraocular Movements: Extraocular movements intact.     Pupils: Pupils are equal, round, and reactive to light.     Comments: No pain to the eyes   Cardiovascular:     Rate and Rhythm: Normal rate and regular rhythm.     Heart sounds: Normal heart sounds.  Pulmonary:     Effort: Pulmonary effort is normal. No respiratory distress.     Breath sounds: Normal breath sounds. No wheezing or rales.  Chest:     Chest wall: No tenderness.  Abdominal:     General: Bowel sounds are normal.     Palpations: Abdomen is soft.  Tenderness: There is no abdominal tenderness. There is no guarding or rebound.  Musculoskeletal:        General: Normal range of motion.     Cervical back: Normal range of motion and neck supple. No rigidity.  Lymphadenopathy:     Cervical: No cervical adenopathy.  Skin:    General: Skin is warm and dry.     Findings: No rash.  Neurological:     Mental Status: He is alert and oriented to person, place, and time.     GCS: GCS eye subscore is 4. GCS verbal subscore is 5. GCS motor subscore is 6.     Cranial Nerves: No cranial nerve deficit, dysarthria or facial asymmetry.     Sensory: No sensory deficit.     Motor: No weakness.     ED Results / Procedures / Treatments   Labs (all labs ordered are listed, but only abnormal results are displayed) Labs Reviewed  CBC WITH DIFFERENTIAL/PLATELET - Abnormal; Notable for the following components:      Result Value   WBC 3.7 (*)    Platelets 132 (*)    All other components within normal limits  SARS CORONAVIRUS 2 BY RT PCR  BASIC METABOLIC PANEL    EKG None  Radiology CT Head Wo Contrast  Result Date: 11/14/2021 CLINICAL DATA:  Headache, new or worsening (Age >= 50y) EXAM: CT HEAD WITHOUT CONTRAST TECHNIQUE: Contiguous axial images were obtained from the base of the skull through the vertex without intravenous contrast. RADIATION DOSE REDUCTION: This exam was performed according to the departmental dose-optimization program which includes automated exposure control, adjustment of the mA and/or kV according to patient size and/or use of iterative reconstruction  technique. COMPARISON:  None Available. FINDINGS: Brain: No evidence of acute infarction, hemorrhage, hydrocephalus, extra-axial collection or mass lesion/mass effect. Vascular: No hyperdense vessel or unexpected calcification. Skull: Normal. Negative for fracture or focal lesion. Sinuses/Orbits: Small mucous retention cyst noted within the visualized left maxillary sinus. Paranasal sinuses are otherwise clear. Orbits are unremarkable. Other: Mastoid air cells and middle ear cavities are clear. IMPRESSION: No acute intracranial abnormality. Electronically Signed   By: Fidela Salisbury M.D.   On: 11/14/2021 20:17    Procedures Procedures    Medications Ordered in ED Medications  ketorolac (TORADOL) 15 MG/ML injection 15 mg (15 mg Intravenous Given 11/14/21 2023)  sodium chloride 0.9 % bolus 500 mL (500 mLs Intravenous New Bag/Given 11/14/21 2025)    ED Course/ Medical Decision Making/ A&P                           Medical Decision Making Amount and/or Complexity of Data Reviewed Labs: ordered. Radiology: ordered.  Risk Prescription drug management.   Patient is a 71 year old with a history of migraines who presents with a headache.  He cannot really tell me if this is similar to headaches he has had in the past.  He reports a left-sided headache.  No neck pain, fevers or other meningeal symptoms.  No symptoms that sound more concerning for subarachnoid hemorrhage.  He had a head CT which shows no acute abnormality.  His labs are nonconcerning.  COVID test is negative.  He does not have any neurologic deficits or concerns for stroke.  No vision changes currently.  No specific eye pain or redness/drainage of the eye.  He was given dose of Toradol and has had some improvement in symptoms.  He says he only has a  very mild dull headache currently.  He was discharged home in good condition.  He was encouraged to follow-up on Tuesday with his primary care doctor.  Return precautions were given.  Final  Clinical Impression(s) / ED Diagnoses Final diagnoses:  Acute nonintractable headache, unspecified headache type    Rx / DC Orders ED Discharge Orders     None         Malvin Johns, MD 11/14/21 2218

## 2021-11-16 ENCOUNTER — Encounter: Payer: Self-pay | Admitting: Family Medicine

## 2021-11-16 ENCOUNTER — Other Ambulatory Visit: Payer: Self-pay

## 2021-11-16 MED ORDER — TOPIRAMATE 100 MG PO TABS: 100.0000 mg | ORAL_TABLET | Freq: Every day | ORAL | 1 refills | Status: DC

## 2021-11-16 MED ORDER — PANTOPRAZOLE SODIUM 40 MG PO TBEC: 40.0000 mg | DELAYED_RELEASE_TABLET | Freq: Every day | ORAL | 1 refills | Status: DC

## 2021-11-17 ENCOUNTER — Other Ambulatory Visit: Payer: Self-pay

## 2021-11-17 MED ORDER — PANTOPRAZOLE SODIUM 40 MG PO TBEC: 40.0000 mg | DELAYED_RELEASE_TABLET | Freq: Every day | ORAL | 1 refills | Status: DC

## 2021-12-05 ENCOUNTER — Other Ambulatory Visit: Payer: Self-pay | Admitting: Family Medicine

## 2021-12-05 DIAGNOSIS — R059 Cough, unspecified: Secondary | ICD-10-CM

## 2021-12-06 ENCOUNTER — Encounter: Payer: Self-pay | Admitting: Family Medicine

## 2021-12-09 ENCOUNTER — Ambulatory Visit: Payer: Medicare Other

## 2021-12-17 NOTE — Patient Instructions (Addendum)
Good to see you again today!  Recommend getting the latest COVID-vaccine and a dose of RSV this fall at your pharmacy Best of luck with your hearing aids!  I will be in touch with your labs- assuming all is well please see me in 6 months We will treat your cough with a zpack- let me know if this does not go away!

## 2021-12-17 NOTE — Progress Notes (Signed)
Patrick [name] at [facility] [name] [address], [place], [place] [address] [phone]  Date:  12/20/2021   Name:  Patrick [name]   DOB:  1950-04-18   MRN:  [id number]  PCP:  Patrick Mclean, MD    Chief Complaint: Osteoarthritis (Here for arthritis)   History of Present Illness:  Patrick [name] is a 71 y.o. very pleasant male patient who presents with the following:  Patient seen today for annual recheck/Medicare physical Most recent visit with myself was in July- history of sleep apnea, HTN, headache, GERD, BPH He uses hydrocodone occasionally for various musculoskeletal pains Also using clonazepam  His daughter had her 3rd child a month ago, a boy this time!    Flu vaccine- give today  COVID booster- recommend  Colon cancer screening up-to-date He was in the ER in September with a headache-migraine with aura He was evaluated with a head CT, treated and discharged to home-no further severe headaches The ER did a BMP and CBC which showed minimal thrombocytopenia and leukopenia Can update other labs today if he would like  He has noted a cough for 4- 6 weeks Not severe, just won't go away He has some old tessalon perles The cough is not generally productive  Feels like the cough is deep in his lungs  [place] drug screen is up-to-date, done in January Patient Active Problem List   Diagnosis Date Noted   Abnormal CT scan of lung 10/29/2020   Chronic bronchitis ([place]) 10/29/2020   Urinary frequency    Recurrent canker sores    Primary snoring    OSA (obstructive sleep apnea)    Morton's neuroma    Lower back pain    Insomnia    Hypertension    History of migraine    Herpes simplex type 1 infection    GERD (gastroesophageal reflux disease)    Headache    Fibromyalgia    Emphysema of lung ([place])    Dysphagia    Depression    BPH (benign prostatic hyperplasia)    Basal cell carcinoma    Arthritis    Anxiety     Dizziness 11/29/2019   Acquired mallet deformity of finger of right hand 11/25/2019   Chronic cough 02/09/2019   Tear of right rotator cuff 12/13/2017   Right rotator cuff tendinitis 01/10/2017   Hearing loss 12/15/2016   Chronic fatigue 10/09/2015   Generalized anxiety disorder 10/09/2015   Memory loss 10/09/2015   Chronic SI joint pain 05/25/2015   Recurrent major depressive disorder, in partial remission ([place]) 04/30/2015   Obstructive sleep apnea of adult 02/09/2015   Lumbar facet joint pain 04/09/2014   Benign prostatic hyperplasia with urinary obstruction 11/18/2013   Acid reflux 11/18/2013   Degenerative arthritis of lumbar spine 11/18/2013   Headache, migraine 11/18/2013   Anal pain 11/18/2013   Atopic dermatitis 11/18/2013   Bladder neck contracture 11/18/2013   Constipation 11/18/2013   Crush injury to finger 11/18/2013   Deviated nasal septum 11/18/2013   Empty sella syndrome ([place]) 11/18/2013   Abdominal pain 11/18/2013   Hematoma, subungual, third finger, left 11/18/2013   Hypertrophy of nasal turbinates 11/18/2013   Laceration of third finger, left 11/18/2013   Lateral epicondylitis 11/18/2013   Lymph nodes enlarged 11/18/2013   Nonallopathic lesion of sacral region 11/18/2013   OAB (overactive bladder) 11/18/2013   Open fracture of distal phalanx of third finger of left hand 11/18/2013   Other  intervertebral disc degeneration, lumbar region 11/18/2013   External hemorrhoids with other complication 56/43/3295   Restless leg 05/27/2009    Past Medical History:  Diagnosis Date   Anxiety    Arthritis    Basal cell carcinoma    BPH (benign prostatic hyperplasia)    Depression    Dysphagia    Emphysema of lung ([facility])    Fibromyalgia    GERD (gastroesophageal reflux disease)    otc  tums   Headache    migraines   Herpes simplex type 1 infection    History of migraine    Hypertension    no longer on medication   Insomnia    Lower back pain    Morton's  neuroma    OSA (obstructive sleep apnea)    Primary snoring    Recurrent canker sores    Urinary frequency     Past Surgical History:  Procedure Laterality Date   EPIDIDYMECTOMY     for spermatocele   EYE SURGERY Bilateral    lasik eye surgery   FOOT SURGERY Left    INGUINAL HERNIA REPAIR Left    01/25/1999   MOUTH SURGERY     pancreatic surgery r/t trauma     SEPTOPLASTY N/A 02/09/2015   Procedure: SEPTOPLASTY;  Surgeon: Rozetta Nunnery, MD;  Location: [facility];  Service: ENT;  Laterality: N/A;   SKIN CANCER EXCISION     TONSILLECTOMY     TRANSURETHRAL INCISION OF PROSTATE     TURBINATE REDUCTION Bilateral 02/09/2015   Procedure: BILATERAL TURBINATE REDUCTION;  Surgeon: Rozetta Nunnery, MD;  Location: [facility];  Service: ENT;  Laterality: Bilateral;   UVULOPALATOPHARYNGOPLASTY N/A 02/09/2015   Procedure: UVULOPALATOPHARYNGOPLASTY (UPPP);  Surgeon: Rozetta Nunnery, MD;  Location: [facility];  Service: ENT;  Laterality: N/A;    Social History   Tobacco Use   Smoking status: Former    Packs/day: 2.00    Years: 30.00    Total pack years: 60.00    Types: Cigarettes, Pipe, Cigars    Start date: 03/1967    Quit date: 10/29/2005    Years since quitting: 16.1   Smokeless tobacco: Never  Substance Use Topics   Alcohol use: Yes    Alcohol/week: 1.0 standard drink of alcohol    Types: 1 Cans of beer per week    Comment: 1 beer every other day   Drug use: No    Family History  Problem Relation Age of Onset   Coronary artery disease Mother    Stroke Mother    Hypertension Father    Anxiety disorder Brother    Skin cancer Brother    Stroke Brother     No Known Allergies  Medication list has been reviewed and updated.  Current Outpatient Medications on File Prior to Visit  Medication Sig Dispense Refill   benzonatate (TESSALON) 200 MG capsule Take 1 capsule (200 mg total) by mouth 3 (three) times daily as needed for cough. 60 capsule 2   clonazePAM (KLONOPIN) 0.5  MG tablet TAKE ONE TO TWO TABLETS BY MOUTH EVERY NIGHT AT BEDTIME AS NEEDED FOR SLEEP 180 tablet 0   famotidine (PEPCID) 20 MG tablet Take 1 tablet (20 mg total) by mouth 2 (two) times daily as needed for heartburn. 180 tablet 1   fluticasone (FLONASE) 50 MCG/ACT nasal spray Place 2 sprays into both nostrils daily. 43 g 3   HYDROcodone-acetaminophen (NORCO/VICODIN) 5-325 MG tablet Take 1 tablet by mouth every 8 (eight) hours as needed  for moderate pain. 30 tablet 0   indomethacin (INDOCIN) 25 MG capsule Take 1 to 2 capsules three times daily as needed for headache. 20 capsule 0   losartan (COZAAR) 50 MG tablet TAKE ONE TABLET BY MOUTH DAILY 90 tablet 3   Melatonin 5 MG TABS Take 5 mg by mouth at bedtime.     meloxicam (MOBIC) 15 MG tablet Take 1 tablet (15 mg total) by mouth daily. 90 tablet 3   methylphenidate (RITALIN) 20 MG tablet Take 1 tablet (20 mg total) by mouth 2 (two) times daily. Use as needed for chronic fatigue 30 tablet 0   montelukast (SINGULAIR) 10 MG tablet TAKE ONE TABLET BY MOUTH EVERY NIGHT AT BEDTIME 90 tablet 3   pantoprazole (PROTONIX) 40 MG tablet Take 1 tablet (40 mg total) by mouth daily. 90 tablet 1   pregabalin (LYRICA) 100 MG capsule TAKE TWO CAPSULES BY MOUTH DAILY AT BEDTIME 180 capsule 3   rosuvastatin (CRESTOR) 10 MG tablet TAKE ONE TABLET BY MOUTH DAILY 30 tablet 3   sildenafil (VIAGRA) 100 MG tablet Take 0.5-1 tablets (50-100 mg total) by mouth daily as needed for erectile dysfunction. 6 tablet 11   sucralfate (CARAFATE) 1 g tablet Take 1 tablet (1 g total) by mouth 4 (four) times daily -  with meals and at bedtime. 40 tablet 0   SUMAtriptan (IMITREX) 100 MG tablet Take 1 tablet earliest onset of migraine.  May repeat in 2 hours if headache persists or recurs.  Maximum 2 tablets in 24 hours. 10 tablet 5   tolterodine (DETROL LA) 4 MG 24 hr capsule Take 1 capsule (4 mg total) by mouth daily. 90 capsule 3   topiramate (TOPAMAX) 100 MG tablet Take 1 tablet (100 mg  total) by mouth daily. 90 tablet 1   traZODone (DESYREL) 150 MG tablet TAKE ONE TABLET BY MOUTH EVERY NIGHT AT BEDTIME AS NEEDED FOR SLEEP 30 tablet 5   vitamin B-12 (CYANOCOBALAMIN) 500 MCG tablet Take 500 mcg by mouth daily.     metoprolol tartrate (LOPRESSOR) 100 MG tablet Take 1 tablet (100 mg total) by mouth once for 1 dose. Take two hours prior to your cardiac CT 1 tablet 0   No current facility-administered medications on file prior to visit.    Review of Systems:  As per HPI- otherwise negative.   Physical Examination: Vitals:   12/20/21 1343  BP: 122/74  Pulse: 86  Resp: 16  Temp: 98 F (36.7 C)  SpO2: 95%   Vitals:   12/20/21 1343  Weight: 190 lb (86.2 kg)  Height: 6' (1.829 m)   Body mass index is 25.77 kg/m. Ideal Body Weight: Weight in (lb) to have BMI = 25: 183.9  GEN: no acute distress.  Normal weight, looks well HEENT: Atraumatic, Normocephalic.  [name] is having some difficulty with his hearing aids today, this is very frustrating! Ears and Nose: No external deformity. CV: RRR, No M/G/R. No JVD. No thrill. No extra heart sounds. PULM: CTA B, no wheezes, crackles, rhonchi. No retractions. No resp. distress. No accessory muscle use. ABD: S, NT, ND, +BS. No rebound. No HSM. EXTR: No c/c/e PSYCH: Normally interactive. Conversant.    Assessment and Plan: Osteoarthritis of lumbar spine, unspecified spinal osteoarthritis complication status  Chronic pain syndrome  Thrombocytopenia (HCC)  GAD (generalized anxiety disorder) - Plan: clonazePAM (KLONOPIN) 0.5 MG tablet  Primary hypertension - Plan: Comprehensive metabolic panel  Cough - Plan: benzonatate (TESSALON) 200 MG capsule, azithromycin (ZITHROMAX) 250 MG tablet  Primary insomnia - Plan: traZODone (DESYREL) 150 MG tablet  Screening for malignant neoplasm of prostate - Plan: PSA, Medicare ( [place] only)  Screening for hyperlipidemia - Plan: Lipid panel  Need for influenza vaccination -  Plan: Flu Vaccine QUAD High Dose(Fluad)  Following up today, given flu shot He has noted cough for 4 to 6 weeks.  Refill Tessalon, treat with azithromycin.  If not improved he will let me know Labs pending as above Refilled his clonazepam, trazodone  Signed Lamar Blinks, MD  Addendum 10/10, received labs as below.  Message to patient  Results for orders placed or performed in visit on 12/20/21  Comprehensive metabolic panel  Result Value Ref Range   Sodium 142 135 - 145 mEq/L   Potassium 3.9 3.5 - 5.1 mEq/L   Chloride 109 96 - 112 mEq/L   CO2 24 19 - 32 mEq/L   Glucose, Bld 105 (H) 70 - 99 mg/dL   BUN 18 6 - 23 mg/dL   Creatinine, Ser 1.05 0.40 - 1.50 mg/dL   Total Bilirubin 0.6 0.2 - 1.2 mg/dL   Alkaline Phosphatase 55 39 - 117 U/L   AST 16 0 - 37 U/L   ALT 13 0 - 53 U/L   Total Protein 6.8 6.0 - 8.3 g/dL   Albumin 4.4 3.5 - 5.2 g/dL   GFR 72.60 >60.00 mL/min   Calcium 9.4 8.4 - 10.5 mg/dL  Lipid panel  Result Value Ref Range   Cholesterol 149 0 - 200 mg/dL   Triglycerides 112.0 0.0 - 149.0 mg/dL   HDL 47.00 >39.00 mg/dL   VLDL 22.4 0.0 - 40.0 mg/dL   LDL Cholesterol 80 0 - 99 mg/dL   Total CHOL/HDL Ratio 3    NonHDL 102.11   PSA, Medicare ( [facility] [facility] only)  Result Value Ref Range   PSA 0.38 0.10 - 4.00 ng/ml

## 2021-12-20 ENCOUNTER — Encounter: Payer: Self-pay | Admitting: Family Medicine

## 2021-12-20 ENCOUNTER — Ambulatory Visit (INDEPENDENT_AMBULATORY_CARE_PROVIDER_SITE_OTHER): Payer: Medicare Other | Admitting: Family Medicine

## 2021-12-20 VITALS — BP 122/74 | HR 86 | Temp 98.0°F | Resp 16 | Ht 72.0 in | Wt 190.0 lb

## 2021-12-20 DIAGNOSIS — Z125 Encounter for screening for malignant neoplasm of prostate: Secondary | ICD-10-CM | POA: Diagnosis not present

## 2021-12-20 DIAGNOSIS — I10 Essential (primary) hypertension: Secondary | ICD-10-CM

## 2021-12-20 DIAGNOSIS — G894 Chronic pain syndrome: Secondary | ICD-10-CM | POA: Diagnosis not present

## 2021-12-20 DIAGNOSIS — M47816 Spondylosis without myelopathy or radiculopathy, lumbar region: Secondary | ICD-10-CM

## 2021-12-20 DIAGNOSIS — D696 Thrombocytopenia, unspecified: Secondary | ICD-10-CM

## 2021-12-20 DIAGNOSIS — F411 Generalized anxiety disorder: Secondary | ICD-10-CM

## 2021-12-20 DIAGNOSIS — Z23 Encounter for immunization: Secondary | ICD-10-CM

## 2021-12-20 DIAGNOSIS — Z13220 Encounter for screening for lipoid disorders: Secondary | ICD-10-CM

## 2021-12-20 DIAGNOSIS — R059 Cough, unspecified: Secondary | ICD-10-CM

## 2021-12-20 DIAGNOSIS — F5101 Primary insomnia: Secondary | ICD-10-CM

## 2021-12-20 MED ORDER — BENZONATATE 200 MG PO CAPS: 200.0000 mg | ORAL_CAPSULE | Freq: Three times a day (TID) | ORAL | 2 refills | Status: DC | PRN

## 2021-12-20 MED ORDER — TRAZODONE HCL 150 MG PO TABS: ORAL_TABLET | ORAL | 3 refills | Status: DC

## 2021-12-20 MED ORDER — AZITHROMYCIN 250 MG PO TABS: ORAL_TABLET | ORAL | 0 refills | Status: AC

## 2021-12-20 MED ORDER — CLONAZEPAM 0.5 MG PO TABS: ORAL_TABLET | ORAL | 1 refills | Status: DC

## 2021-12-21 ENCOUNTER — Encounter: Payer: Self-pay | Admitting: Family Medicine

## 2021-12-21 LAB — LIPID PANEL
Cholesterol: 149 mg/dL (ref 0–200)
HDL: 47 mg/dL (ref >39.00)
LDL Cholesterol: 80 mg/dL (ref 0–99)
NonHDL: 102.11
Total CHOL/HDL Ratio: 3
Triglycerides: 112 mg/dL (ref 0.0–149.0)
VLDL: 22.4 mg/dL (ref 0.0–40.0)

## 2021-12-21 LAB — COMPREHENSIVE METABOLIC PANEL
ALT: 13 U/L (ref 0–53)
AST: 16 U/L (ref 0–37)
Albumin: 4.4 g/dL (ref 3.5–5.2)
Alkaline Phosphatase: 55 U/L (ref 39–117)
BUN: 18 mg/dL (ref 6–23)
CO2: 24 mEq/L (ref 19–32)
Calcium: 9.4 mg/dL (ref 8.4–10.5)
Chloride: 109 mEq/L (ref 96–112)
Creatinine, Ser: 1.05 mg/dL (ref 0.40–1.50)
GFR: 72.6 mL/min (ref >60.00)
Glucose, Bld: 105 mg/dL — ABNORMAL HIGH (ref 70–99)
Potassium: 3.9 mEq/L (ref 3.5–5.1)
Sodium: 142 mEq/L (ref 135–145)
Total Bilirubin: 0.6 mg/dL (ref 0.2–1.2)
Total Protein: 6.8 g/dL (ref 6.0–8.3)

## 2021-12-21 LAB — PSA, MEDICARE: PSA: 0.38 ng/ml (ref 0.10–4.00)

## 2022-01-07 ENCOUNTER — Encounter: Payer: Self-pay | Admitting: Family Medicine

## 2022-01-08 MED ORDER — PREDNISONE 20 MG PO TABS: ORAL_TABLET | ORAL | 0 refills | Status: DC

## 2022-01-08 NOTE — Addendum Note (Signed)
Addended by: Lamar Blinks C on: 01/08/2022 05:16 PM   Modules accepted: Orders

## 2022-01-25 ENCOUNTER — Encounter: Payer: Self-pay | Admitting: Family Medicine

## 2022-01-25 DIAGNOSIS — R053 Chronic cough: Secondary | ICD-10-CM

## 2022-01-26 NOTE — Addendum Note (Signed)
Addended by: Lamar Blinks C on: 01/26/2022 02:21 PM   Modules accepted: Orders

## 2022-02-16 ENCOUNTER — Encounter: Payer: Self-pay | Admitting: Family Medicine

## 2022-02-16 ENCOUNTER — Ambulatory Visit
Admission: RE | Admit: 2022-02-16 | Discharge: 2022-02-16 | Disposition: A | Discharge status: Home / Self Care | Payer: Medicare Other | Source: Ambulatory Visit | Attending: Family Medicine | Admitting: Family Medicine

## 2022-02-16 DIAGNOSIS — R053 Chronic cough: Secondary | ICD-10-CM

## 2022-04-11 ENCOUNTER — Encounter: Payer: Self-pay | Admitting: Family Medicine

## 2022-04-12 MED ORDER — ARNUITY ELLIPTA 100 MCG/ACT IN AEPB: 1.0000 | INHALATION_SPRAY | Freq: Every day | RESPIRATORY_TRACT | 2 refills | Status: DC

## 2022-04-12 NOTE — Addendum Note (Signed)
Addended by: Lamar Blinks C on: 04/12/2022 06:52 AM   Modules accepted: Orders

## 2022-04-15 ENCOUNTER — Other Ambulatory Visit: Payer: Self-pay | Admitting: Family Medicine

## 2022-04-18 ENCOUNTER — Telehealth: Payer: Self-pay

## 2022-04-18 MED ORDER — FLUTICASONE PROPIONATE HFA 110 MCG/ACT IN AERO: 1.0000 | INHALATION_SPRAY | Freq: Two times a day (BID) | RESPIRATORY_TRACT | 3 refills | Status: DC

## 2022-04-18 NOTE — Telephone Encounter (Signed)
Flovent HFA no longer available.   Arnuity Ellipta Not Required Fluticasone Propionate Diskus Not Required [place] Required ArmonAir Digihaler Required Asmanex [employer] Required Asmanex Twisthaler 273mg Required Budesonide 0.'25mg'$  & 0.'5mg'$  Required Budesonide-Formoterol Fumarate Dihydrate Required Pulmicort Flexhaler Required QVAR Redihaler Required

## 2022-04-18 NOTE — Addendum Note (Signed)
Addended by: Lamar Blinks C on: 04/18/2022 03:05 PM   Modules accepted: Orders

## 2022-04-19 MED ORDER — FLUTICASONE-SALMETEROL 100-50 MCG/ACT IN AEPB
1.0000 | INHALATION_SPRAY | Freq: Two times a day (BID) | RESPIRATORY_TRACT | 3 refills | Status: DC
Start: 2022-04-19 — End: 2023-05-31

## 2022-04-19 MED ORDER — FLUTICASONE-SALMETEROL 100-50 MCG/ACT IN AEPB: 1.0000 | INHALATION_SPRAY | Freq: Two times a day (BID) | RESPIRATORY_TRACT | 2 refills | Status: DC

## 2022-04-19 NOTE — Telephone Encounter (Signed)
Reviewed patient insurance on file. It looks like he has coverage with BCBS thru [place] on file and also a BCBS supplement for Medicare A and B.  Rx Benefits are only showing Part D benefits thru [employer].  Reviewed formulary for [employer]:  Patient does have a $200 deductible to meet ar Arnuity (fluticasone) and then would be $47.   Tried to contact patient to get more information about the [place] as I was not able to find a recent copy of that card to know if he has Rx covereage with it or not. If he does, should make sure that his pharmacy has it on file so that it could be run secondary to Medicare / [employer]. This could lower cost of Arnuity.   Generic Advair - fluticasone + salmeterol or Wixela are covered as tier 2- should be $0 and they are excluded from deductible so should be $0.   I called [employer] and patient would have to meet income requirement of < $61,000 per year per household and spend $600 out of pocket for 2024 before he could apply for medication assistance program.   There is also a Patient advocate fund that has grants of $2000 available to medicare patients for copay relief if they have a diagnosis of asthma (I only see chronic bronchitis on patient's problem list) and if patient meets income requirement of < $59,000 per year per household.   I tried to contact patient by phone to discuss. Unable to reach him and VM full so could not leave message.   I would consider trial of Wixela (generic Advair) 150mg/50mcg - 1 inhaler twice a day. Rinse mouth after each use.   Will forward to PCP for review and recommendations.

## 2022-04-19 NOTE — Telephone Encounter (Signed)
Received call from Mickel Baas at [facility]. She states pt told them that insurance told him generic flovent was covered but she states this is not true. She says Arnuity is covered but due to his deductible of $200, his initial cost will be $204 then additional copays will be $4. Pt is stating he is unable to pay $204 but it sounds like it doesn't matter what RX is sent he still has the $200 deductible up front.

## 2022-04-19 NOTE — Telephone Encounter (Signed)
Called [facility] - confimred that [place] cost was $0 but they did not have Wixela in stock. They did have generic Advair - fluticasone / salmeterol 100/87mg which is the same medication.  [facility] Tried to call patient but no answer - will send MyChart message.

## 2022-04-19 NOTE — Addendum Note (Signed)
Addended by: Lamar Blinks C on: 04/19/2022 12:29 PM   Modules accepted: Orders

## 2022-05-03 ENCOUNTER — Encounter: Payer: Self-pay | Admitting: Pharmacist

## 2022-05-09 ENCOUNTER — Other Ambulatory Visit: Payer: Self-pay | Admitting: Family Medicine

## 2022-05-10 NOTE — Progress Notes (Signed)
[facility] [place], [place],  [id number] [phone]  Date:  05/16/2022   Name:  Patrick [name]   DOB:  March 10, 1951   MRN:  [id number]  PCP:  Darreld Mclean, MD    Chief Complaint: Follow-up (Concerns/ questions: possible Neuropathy /AWV due)   History of Present Illness:  Patrick [name] is a 72 y.o. very pleasant male patient who presents with the following:  Patient seen today for periodic follow-up/medication check Last visit with myself was in October- history of sleep apnea, HTN, headache, GERD, BPH He uses hydrocodone occasionally for various musculoskeletal pains Also using clonazepam  At last visit in October he had been coughing for 4 to 6 weeks-I treated with azithromycin Lab work in October looked fine He notes the cough may occur occasionally - he cannot determine any trigger except maybe PND We did get a chest x-ray in December which was negative  Recommend COVID booster if not up-to-date Need to update UDS; he occasionally uses hydrocodone He notes his back hurts "constantly," he has considered going back to have shots but is still thinking about it He notes the bottom of his right foot will hurt - sharp pain- which will come and go Seems to occur at random  Lab Results  Component Value Date   HGBA1C 5.3 03/25/2021    Clonazepam as needed Pepcid Advair Hydrocodone as needed Losartan 50 Methylphenidate as needed Metoprolol Singulair Lyrica Crestor Sildenafil Imitrex Topamax  Trazodone  Patient Active Problem List   Diagnosis Date Noted   Abnormal CT scan of lung 10/29/2020   Chronic bronchitis ([place]) 10/29/2020   Urinary frequency    Recurrent canker sores    Primary snoring    OSA (obstructive sleep apnea)    Morton's neuroma    Lower back pain    Insomnia    Hypertension    History of migraine    Herpes simplex type 1 infection    GERD  (gastroesophageal reflux disease)    Headache    Fibromyalgia    Emphysema of lung ([place])    Dysphagia    Depression    BPH (benign prostatic hyperplasia)    Basal cell carcinoma    Arthritis    Anxiety    Dizziness 11/29/2019   Acquired mallet deformity of finger of right hand 11/25/2019   Chronic cough 02/09/2019   Tear of right rotator cuff 12/13/2017   Right rotator cuff tendinitis 01/10/2017   Hearing loss 12/15/2016   Chronic fatigue 10/09/2015   Generalized anxiety disorder 10/09/2015   Memory loss 10/09/2015   Chronic SI joint pain 05/25/2015   Recurrent major depressive disorder, in partial remission ([place]) 04/30/2015   Obstructive sleep apnea of adult 02/09/2015   Lumbar facet joint pain 04/09/2014   Benign prostatic hyperplasia with urinary obstruction 11/18/2013   Acid reflux 11/18/2013   Degenerative arthritis of lumbar spine 11/18/2013   Headache, migraine 11/18/2013   Anal pain 11/18/2013   Atopic dermatitis 11/18/2013   Bladder neck contracture 11/18/2013   Constipation 11/18/2013   Crush injury to finger 11/18/2013   Deviated nasal septum 11/18/2013   Empty sella syndrome ([place]) 11/18/2013   Abdominal pain 11/18/2013   Hematoma, subungual, third finger, left 11/18/2013   Hypertrophy of nasal turbinates 11/18/2013   Laceration of third finger, left 11/18/2013   Lateral epicondylitis 11/18/2013   Lymph nodes enlarged 11/18/2013   Nonallopathic lesion of sacral region 11/18/2013  OAB (overactive bladder) 11/18/2013   Open fracture of distal phalanx of third finger of left hand 11/18/2013   Other intervertebral disc degeneration, lumbar region 11/18/2013   External hemorrhoids with other complication [id number]   Restless leg 05/27/2009    Past Medical History:  Diagnosis Date   Anxiety    Arthritis    Basal cell carcinoma    BPH (benign prostatic hyperplasia)    Depression    Dysphagia    Emphysema of lung ([facility])    Fibromyalgia    GERD  (gastroesophageal reflux disease)    otc  tums   Headache    migraines   Herpes simplex type 1 infection    History of migraine    Hypertension    no longer on medication   Insomnia    Lower back pain    Morton's neuroma    OSA (obstructive sleep apnea)    Primary snoring    Recurrent canker sores    Urinary frequency     Past Surgical History:  Procedure Laterality Date   EPIDIDYMECTOMY     for spermatocele   EYE SURGERY Bilateral    lasik eye surgery   FOOT SURGERY Left    INGUINAL HERNIA REPAIR Left    01/25/1999   MOUTH SURGERY     pancreatic surgery r/t trauma     SEPTOPLASTY N/A 02/09/2015   Procedure: SEPTOPLASTY;  Surgeon: Rozetta Nunnery, MD;  Location: [place];  Service: ENT;  Laterality: N/A;   SKIN CANCER EXCISION     TONSILLECTOMY     TRANSURETHRAL INCISION OF PROSTATE     TURBINATE REDUCTION Bilateral 02/09/2015   Procedure: BILATERAL TURBINATE REDUCTION;  Surgeon: Rozetta Nunnery, MD;  Location: [place];  Service: ENT;  Laterality: Bilateral;   UVULOPALATOPHARYNGOPLASTY N/A 02/09/2015   Procedure: UVULOPALATOPHARYNGOPLASTY (UPPP);  Surgeon: Rozetta Nunnery, MD;  Location: [place];  Service: ENT;  Laterality: N/A;    Social History   Tobacco Use   Smoking status: Former    Packs/day: 2.00    Years: 30.00    Total pack years: 60.00    Types: Cigarettes, Pipe, Cigars    Start date: 03/1967    Quit date: 10/29/2005    Years since quitting: 16.5   Smokeless tobacco: Never  Substance Use Topics   Alcohol use: Yes    Alcohol/week: 1.0 standard drink of alcohol    Types: 1 Cans of beer per week    Comment: 1 beer every other day   Drug use: No    Family History  Problem Relation Age of Onset   Coronary artery disease Mother    Stroke Mother    Hypertension Father    Anxiety disorder Brother    Skin cancer Brother    Stroke Brother     No Known Allergies  Medication list has been reviewed and updated.  Current Outpatient  Medications on File Prior to Visit  Medication Sig Dispense Refill   benzonatate (TESSALON) 200 MG capsule Take 1 capsule (200 mg total) by mouth 3 (three) times daily as needed for cough. 60 capsule 2   clonazePAM (KLONOPIN) 0.5 MG tablet TAKE ONE TO TWO TABLETS BY MOUTH EVERY NIGHT AT BEDTIME AS NEEDED FOR SLEEP 180 tablet 1   famotidine (PEPCID) 20 MG tablet Take 1 tablet (20 mg total) by mouth 2 (two) times daily as needed for heartburn. 180 tablet 1   fluticasone (FLONASE) 50 MCG/ACT nasal spray Place 2 sprays into both nostrils daily.  43 g 3   fluticasone-salmeterol (ADVAIR) 100-50 MCG/ACT AEPB Inhale 1 puff into the lungs 2 (two) times daily. **Rinse mouth after each use** 60 each 3   HYDROcodone-acetaminophen (NORCO/VICODIN) 5-325 MG tablet Take 1 tablet by mouth every 8 (eight) hours as needed for moderate pain. 30 tablet 0   indomethacin (INDOCIN) 25 MG capsule Take 1 to 2 capsules three times daily as needed for headache. 20 capsule 0   losartan (COZAAR) 50 MG tablet TAKE ONE TABLET BY MOUTH DAILY 90 tablet 3   Melatonin 5 MG TABS Take 5 mg by mouth at bedtime.     meloxicam (MOBIC) 15 MG tablet Take 1 tablet (15 mg total) by mouth daily. 90 tablet 3   methylphenidate (RITALIN) 20 MG tablet Take 1 tablet (20 mg total) by mouth 2 (two) times daily. Use as needed for chronic fatigue 30 tablet 0   montelukast (SINGULAIR) 10 MG tablet TAKE ONE TABLET BY MOUTH EVERY NIGHT AT BEDTIME 90 tablet 3   pantoprazole (PROTONIX) 40 MG tablet TAKE 1 TABLET BY MOUTH DAILY 30 tablet 0   pregabalin (LYRICA) 100 MG capsule TAKE TWO CAPSULES BY MOUTH DAILY AT BEDTIME 180 capsule 3   rosuvastatin (CRESTOR) 10 MG tablet TAKE ONE TABLET BY MOUTH DAILY 30 tablet 3   sildenafil (VIAGRA) 100 MG tablet TAKE ONE-HALF TO ONE TABLET BY MOUTH DAILY AS NEEDED 6 tablet 11   sucralfate (CARAFATE) 1 g tablet Take 1 tablet (1 g total) by mouth 4 (four) times daily -  with meals and at bedtime. 40 tablet 0   SUMAtriptan  (IMITREX) 100 MG tablet Take 1 tablet earliest onset of migraine.  May repeat in 2 hours if headache persists or recurs.  Maximum 2 tablets in 24 hours. 10 tablet 5   topiramate (TOPAMAX) 100 MG tablet TAKE 1 TABLET BY MOUTH DAILY 30 tablet 0   traZODone (DESYREL) 150 MG tablet TAKE ONE TABLET BY MOUTH EVERY NIGHT AT BEDTIME AS NEEDED FOR SLEEP 90 tablet 3   vitamin B-12 (CYANOCOBALAMIN) 500 MCG tablet Take 500 mcg by mouth daily.     metoprolol tartrate (LOPRESSOR) 100 MG tablet Take 1 tablet (100 mg total) by mouth once for 1 dose. Take two hours prior to your cardiac CT 1 tablet 0   No current facility-administered medications on file prior to visit.    Review of Systems:  As per HPI- otherwise negative.   Physical Examination: Vitals:   05/16/22 1107  BP: 120/80  Pulse: 89  Resp: 18  Temp: 98.8 F (37.1 C)  SpO2: 95%   Vitals:   05/16/22 1107  Weight: 189 lb 9.6 oz (86 kg)  Height: 6' (1.829 m)   Body mass index is 25.71 kg/m. Ideal Body Weight: Weight in (lb) to have BMI = 25: 183.9  GEN: no acute distress.  Normal weight, looks well HEENT: Atraumatic, Normocephalic.  Ears and Nose: No external deformity. CV: RRR, No M/G/R. No JVD. No thrill. No extra heart sounds. PULM: CTA B, no wheezes, crackles, rhonchi. No retractions. No resp. distress. No accessory muscle use. ABD: S, NT, ND, +BS. No rebound. No HSM. EXTR: No c/c/e PSYCH: Normally interactive. Conversant.  Examined right foot.  Neurovascularly intact.  No swelling or redness.  He has some tenderness over all of his MCP joints which may indicate arthritis  Assessment and Plan: GAD (generalized anxiety disorder)  Primary hypertension - Plan: CBC, Basic metabolic panel  Chronic fatigue  Chronic knee pain, unspecified laterality -  Plan: DRUG MONITORING, PANEL 8 WITH CONFIRMATION, URINE  PND (post-nasal drip) - Plan: ipratropium (ATROVENT) 0.03 % nasal spray  Sciatica, unspecified laterality - Plan:  HYDROcodone-acetaminophen (NORCO/VICODIN) 5-325 MG tablet  Osteoarthritis of lumbar spine, unspecified spinal osteoarthritis complication status - Plan: HYDROcodone-acetaminophen (NORCO/VICODIN) 5-325 MG tablet  Benign prostatic hyperplasia with urinary obstruction  Irritable airways - Plan: montelukast (SINGULAIR) 10 MG tablet  Chronic pain syndrome - Plan: pregabalin (LYRICA) 100 MG capsule  Cough - Plan: benzonatate (TESSALON) 200 MG capsule  Other migraine without status migrainosus, not intractable - Plan: topiramate (TOPAMAX) 100 MG tablet, SUMAtriptan (IMITREX) 100 MG tablet  Mixed hyperlipidemia - Plan: rosuvastatin (CRESTOR) 10 MG tablet  Following up today.  Medications refilled as needed. He did a coronary calcium score in 2022 Will have him try Atrovent nasal for postnasal drainage which is causing cough, but cautioned patient about potential worsening of BPH symptoms Blood pressure is under good control Will obtain a UDS today, basic blood work.  Refilled hydrocodone which he uses occasionally for more severe back pain  Signed Lamar Blinks, MD  Received labs as below, message to patient  Results for orders placed or performed in visit on 05/16/22  CBC  Result Value Ref Range   WBC 3.7 (L) 4.0 - 10.5 K/uL   RBC 4.68 4.22 - 5.81 Mil/uL   Platelets 138.0 (L) 150.0 - 400.0 K/uL   Hemoglobin 14.9 13.0 - 17.0 g/dL   HCT 43.8 39.0 - 52.0 %   MCV 93.6 78.0 - 100.0 fl   MCHC 34.0 30.0 - 36.0 g/dL   RDW 13.1 11.5 - [id number] %  Basic metabolic panel  Result Value Ref Range   Sodium 142 135 - 145 mEq/L   Potassium 4.0 3.5 - 5.1 mEq/L   Chloride 109 96 - 112 mEq/L   CO2 27 19 - 32 mEq/L   Glucose, Bld 88 70 - 99 mg/dL   BUN 21 6 - 23 mg/dL   Creatinine, Ser 1.04 0.40 - 1.50 mg/dL   GFR 73.23 >60.00 mL/min   Calcium 9.4 8.4 - 10.5 mg/dL   Patient did see hematology last year for mild leukopenia, thrombocytopenia-they were not alarmed

## 2022-05-16 ENCOUNTER — Encounter: Payer: Self-pay | Admitting: Family Medicine

## 2022-05-16 ENCOUNTER — Ambulatory Visit (INDEPENDENT_AMBULATORY_CARE_PROVIDER_SITE_OTHER): Payer: Medicare Other | Admitting: Family Medicine

## 2022-05-16 VITALS — BP 120/80 | HR 89 | Temp 98.8°F | Resp 18 | Ht 72.0 in | Wt 189.6 lb

## 2022-05-16 DIAGNOSIS — N401 Enlarged prostate with lower urinary tract symptoms: Secondary | ICD-10-CM

## 2022-05-16 DIAGNOSIS — R5382 Chronic fatigue, unspecified: Secondary | ICD-10-CM | POA: Diagnosis not present

## 2022-05-16 DIAGNOSIS — R0982 Postnasal drip: Secondary | ICD-10-CM

## 2022-05-16 DIAGNOSIS — M47816 Spondylosis without myelopathy or radiculopathy, lumbar region: Secondary | ICD-10-CM

## 2022-05-16 DIAGNOSIS — M5430 Sciatica, unspecified side: Secondary | ICD-10-CM

## 2022-05-16 DIAGNOSIS — G8929 Other chronic pain: Secondary | ICD-10-CM

## 2022-05-16 DIAGNOSIS — J45998 Other asthma: Secondary | ICD-10-CM

## 2022-05-16 DIAGNOSIS — R059 Cough, unspecified: Secondary | ICD-10-CM

## 2022-05-16 DIAGNOSIS — F411 Generalized anxiety disorder: Secondary | ICD-10-CM

## 2022-05-16 DIAGNOSIS — G43809 Other migraine, not intractable, without status migrainosus: Secondary | ICD-10-CM

## 2022-05-16 DIAGNOSIS — E782 Mixed hyperlipidemia: Secondary | ICD-10-CM

## 2022-05-16 DIAGNOSIS — M25569 Pain in unspecified knee: Secondary | ICD-10-CM

## 2022-05-16 DIAGNOSIS — I10 Essential (primary) hypertension: Secondary | ICD-10-CM

## 2022-05-16 DIAGNOSIS — G894 Chronic pain syndrome: Secondary | ICD-10-CM

## 2022-05-16 DIAGNOSIS — N138 Other obstructive and reflux uropathy: Secondary | ICD-10-CM

## 2022-05-16 LAB — CBC
HCT: 43.8 % (ref 39.0–52.0)
Hemoglobin: 14.9 g/dL (ref 13.0–17.0)
MCHC: 34 g/dL (ref 30.0–36.0)
MCV: 93.6 fl (ref 78.0–100.0)
Platelets: 138 10*3/uL — ABNORMAL LOW (ref 150.0–400.0)
RBC: 4.68 Mil/uL (ref 4.22–5.81)
RDW: 13.1 % (ref 11.5–15.5)
WBC: 3.7 10*3/uL — ABNORMAL LOW (ref 4.0–10.5)

## 2022-05-16 LAB — BASIC METABOLIC PANEL
BUN: 21 mg/dL (ref 6–23)
CO2: 27 mEq/L (ref 19–32)
Calcium: 9.4 mg/dL (ref 8.4–10.5)
Chloride: 109 mEq/L (ref 96–112)
Creatinine, Ser: 1.04 mg/dL (ref 0.40–1.50)
GFR: 73.23 mL/min (ref >60.00)
Glucose, Bld: 88 mg/dL (ref 70–99)
Potassium: 4 mEq/L (ref 3.5–5.1)
Sodium: 142 mEq/L (ref 135–145)

## 2022-05-16 MED ORDER — BENZONATATE 200 MG PO CAPS: 200.0000 mg | ORAL_CAPSULE | Freq: Three times a day (TID) | ORAL | 3 refills | Status: DC | PRN

## 2022-05-16 MED ORDER — ROSUVASTATIN CALCIUM 10 MG PO TABS: 10.0000 mg | ORAL_TABLET | Freq: Every day | ORAL | 3 refills | Status: DC

## 2022-05-16 MED ORDER — IPRATROPIUM BROMIDE 0.03 % NA SOLN: 2.0000 | Freq: Two times a day (BID) | NASAL | 12 refills | Status: AC

## 2022-05-16 MED ORDER — MONTELUKAST SODIUM 10 MG PO TABS: 10.0000 mg | ORAL_TABLET | Freq: Every day | ORAL | 3 refills | Status: DC

## 2022-05-16 MED ORDER — HYDROCODONE-ACETAMINOPHEN 5-325 MG PO TABS: 1.0000 | ORAL_TABLET | Freq: Three times a day (TID) | ORAL | 0 refills | Status: DC | PRN

## 2022-05-16 MED ORDER — SUMATRIPTAN SUCCINATE 100 MG PO TABS: ORAL_TABLET | ORAL | 5 refills | Status: AC

## 2022-05-16 MED ORDER — PREGABALIN 100 MG PO CAPS: ORAL_CAPSULE | ORAL | 1 refills | Status: DC

## 2022-05-16 MED ORDER — TOPIRAMATE 100 MG PO TABS: 100.0000 mg | ORAL_TABLET | Freq: Every day | ORAL | 3 refills | Status: DC

## 2022-05-16 NOTE — Patient Instructions (Signed)
It was great to see you again today Stop by lab just for a urine test today Please let me know if your foot pain is worsening or if it is persistent, we can look further as needed  Try the nasal spray for postnasal drainage, runny nose.  However if this causes more difficulty emptying your bladder stop using it

## 2022-05-17 LAB — DM TEMPLATE

## 2022-05-17 LAB — DRUG MONITORING, PANEL 8 WITH CONFIRMATION, URINE
6 Acetylmorphine: NEGATIVE ng/mL (ref <10)
Alcohol Metabolites: NEGATIVE ng/mL (ref <500)
Amphetamines: NEGATIVE ng/mL (ref <500)
Benzodiazepines: NEGATIVE ng/mL (ref <100)
Buprenorphine, Urine: NEGATIVE ng/mL (ref <5)
Cocaine Metabolite: NEGATIVE ng/mL (ref <150)
Creatinine: 108.7 mg/dL (ref >=20.0)
MDMA: NEGATIVE ng/mL (ref <500)
Marijuana Metabolite: NEGATIVE ng/mL (ref <20)
Opiates: NEGATIVE ng/mL (ref <100)
Oxidant: NEGATIVE ug/mL (ref <200)
Oxycodone: NEGATIVE ng/mL (ref <100)
pH: 5.6 (ref 4.5–9.0)

## 2022-05-24 ENCOUNTER — Encounter: Payer: Self-pay | Admitting: Family Medicine

## 2022-05-24 MED ORDER — DICLOFENAC SODIUM 1 % EX GEL: CUTANEOUS | 3 refills | Status: AC

## 2022-05-24 NOTE — Addendum Note (Signed)
Addended by: Lamar Blinks C on: 05/24/2022 04:26 PM   Modules accepted: Orders

## 2022-05-26 ENCOUNTER — Other Ambulatory Visit: Payer: Self-pay | Admitting: Family Medicine

## 2022-06-12 ENCOUNTER — Other Ambulatory Visit: Payer: Self-pay | Admitting: Family Medicine

## 2022-06-12 DIAGNOSIS — F411 Generalized anxiety disorder: Secondary | ICD-10-CM

## 2022-06-16 ENCOUNTER — Telehealth: Payer: Self-pay | Admitting: Family Medicine

## 2022-06-16 NOTE — Telephone Encounter (Signed)
Contacted Patrick [name] to schedule their annual wellness visit. Appointment made for 07/01/2022.  Sherol Dade; [facility] Direct Dial: [phone]

## 2022-07-02 ENCOUNTER — Other Ambulatory Visit: Payer: Self-pay | Admitting: Family Medicine

## 2022-07-02 DIAGNOSIS — R5382 Chronic fatigue, unspecified: Secondary | ICD-10-CM

## 2022-07-03 ENCOUNTER — Encounter: Payer: Self-pay | Admitting: Family Medicine

## 2022-07-03 DIAGNOSIS — K219 Gastro-esophageal reflux disease without esophagitis: Secondary | ICD-10-CM

## 2022-07-03 DIAGNOSIS — M79673 Pain in unspecified foot: Secondary | ICD-10-CM

## 2022-07-03 DIAGNOSIS — R1013 Epigastric pain: Secondary | ICD-10-CM

## 2022-07-04 ENCOUNTER — Other Ambulatory Visit: Payer: Self-pay | Admitting: Family Medicine

## 2022-07-04 MED ORDER — SUCRALFATE 1 G PO TABS
1.0000 g | ORAL_TABLET | Freq: Three times a day (TID) | ORAL | 0 refills | Status: AC
Start: 2022-07-04 — End: ?

## 2022-07-04 MED ORDER — METHYLPHENIDATE HCL 20 MG PO TABS
20.0000 mg | ORAL_TABLET | Freq: Two times a day (BID) | ORAL | 0 refills | Status: DC
Start: 2022-07-04 — End: 2022-11-21

## 2022-07-04 NOTE — Addendum Note (Signed)
Addended by: Abbe Amsterdam C on: 07/04/2022 02:04 PM   Modules accepted: Orders

## 2022-07-04 NOTE — Addendum Note (Signed)
Addended by: Abbe Amsterdam C on: 07/04/2022 12:49 PM   Modules accepted: Orders

## 2022-07-04 NOTE — Addendum Note (Signed)
Addended by: Pearline Cables on: 07/04/2022 04:33 PM   Modules accepted: Orders

## 2022-07-05 ENCOUNTER — Encounter: Payer: Self-pay | Admitting: Gastroenterology

## 2022-07-05 NOTE — Progress Notes (Addendum)
[facility] [address] [place], [place] [address] [phone]  Date:  07/06/2022   Name:  Patrick [name]   DOB:  [birth year]   MRN:  [id number]  PCP:  Pearline Cables, MD    Chief Complaint: No chief complaint on file.   History of Present Illness:  Patrick [name] is a 72 y.o. very pleasant male patient who presents with the following:  Pt seen today to discuss stomach problems Last seen by myself last month- history of sleep apnea, HTN, headache, GERD, BPH He uses hydrocodone occasionally for various musculoskeletal pains Also using clonazepam    Patient Active Problem List   Diagnosis Date Noted   Abnormal CT scan of lung 10/29/2020   Chronic bronchitis 10/29/2020   Urinary frequency    Recurrent canker sores    Primary snoring    OSA (obstructive sleep apnea)    Morton's neuroma    Lower back pain    Insomnia    Hypertension    History of migraine    Herpes simplex type 1 infection    GERD (gastroesophageal reflux disease)    Headache    Fibromyalgia    Emphysema of lung    Dysphagia    Depression    BPH (benign prostatic hyperplasia)    Basal cell carcinoma    Arthritis    Anxiety    Dizziness 11/29/2019   Acquired mallet deformity of finger of right hand 11/25/2019   Chronic cough 02/09/2019   Tear of right rotator cuff 12/13/2017   Right rotator cuff tendinitis 01/10/2017   Hearing loss 12/15/2016   Chronic fatigue 10/09/2015   Generalized anxiety disorder 10/09/2015   Memory loss 10/09/2015   Chronic SI joint pain 05/25/2015   Recurrent major depressive disorder, in partial remission 04/30/2015   Obstructive sleep apnea of adult 02/09/2015   Lumbar facet joint pain 04/09/2014   Benign prostatic hyperplasia with urinary obstruction 11/18/2013   Acid reflux 11/18/2013   Degenerative arthritis of lumbar spine 11/18/2013   Headache, migraine 11/18/2013   Anal pain  11/18/2013   Atopic dermatitis 11/18/2013   Bladder neck contracture 11/18/2013   Constipation 11/18/2013   Crush injury to finger 11/18/2013   Deviated nasal septum 11/18/2013   Empty sella syndrome 11/18/2013   Abdominal pain 11/18/2013   Hematoma, subungual, third finger, left 11/18/2013   Hypertrophy of nasal turbinates 11/18/2013   Laceration of third finger, left 11/18/2013   Lateral epicondylitis 11/18/2013   Lymph nodes enlarged 11/18/2013   Nonallopathic lesion of sacral region 11/18/2013   OAB (overactive bladder) 11/18/2013   Open fracture of distal phalanx of third finger of left hand 11/18/2013   Other intervertebral disc degeneration, lumbar region 11/18/2013   External hemorrhoids with other complication 12/20/2011   Restless leg 05/27/2009    Past Medical History:  Diagnosis Date   Anxiety    Arthritis    Basal cell carcinoma    BPH (benign prostatic hyperplasia)    Depression    Dysphagia    Emphysema of lung    Fibromyalgia    GERD (gastroesophageal reflux disease)    otc  tums   Headache    migraines   Herpes simplex type 1 infection    History of migraine    Hypertension    no longer on medication   Insomnia    Lower back pain    Morton's neuroma    OSA (  obstructive sleep apnea)    Primary snoring    Recurrent canker sores    Urinary frequency     Past Surgical History:  Procedure Laterality Date   EPIDIDYMECTOMY     for spermatocele   EYE SURGERY Bilateral    lasik eye surgery   FOOT SURGERY Left    INGUINAL HERNIA REPAIR Left    01/25/1999   MOUTH SURGERY     pancreatic surgery r/t trauma     SEPTOPLASTY N/A 02/09/2015   Procedure: SEPTOPLASTY;  Surgeon: Drema Halon, MD;  Location[facility];  Service: ENT;  Laterality: N/A;   SKIN CANCER EXCISION     TONSILLECTOMY     TRANSURETHRAL INCISION OF PROSTATE     TURBINATE REDUCTION Bilateral 02/09/2015   Procedure: BILATERAL TURBINATE REDUCTION;  Surgeon: Drema Halon, MD;   Location[facility] OR;  Service: ENT;  Laterality: Bilateral;   UVULOPALATOPHARYNGOPLASTY N/A 02/09/2015   Procedure: UVULOPALATOPHARYNGOPLASTY (UPPP);  Surgeon: Drema Halon, MD;  Location: [facility] OR;  Service: ENT;  Laterality: N/A;    Social History   Tobacco Use   Smoking status: Former    Packs/day: 2.00    Years: 30.00    Additional pack years: 0.00    Total pack years: 60.00    Types: Cigarettes, Pipe, Cigars    Start date: 03/1967    Quit date: 10/29/2005    Years since quitting: 16.6   Smokeless tobacco: Never  Substance Use Topics   Alcohol use: Yes    Alcohol/week: 1.0 standard drink of alcohol    Types: 1 Cans of beer per week    Comment: 1 beer every other day   Drug use: No    Family History  Problem Relation Age of Onset   Coronary artery disease Mother    Stroke Mother    Hypertension Father    Anxiety disorder Brother    Skin cancer Brother    Stroke Brother     No Known Allergies  Medication list has been reviewed and updated.  Current Outpatient Medications on File Prior to Visit  Medication Sig Dispense Refill   benzonatate (TESSALON) 200 MG capsule Take 1 capsule (200 mg total) by mouth 3 (three) times daily as needed for cough. 60 capsule 3   clonazePAM (KLONOPIN) 0.5 MG tablet TAKE ONE TO TWO TABLETS BY MOUTH EVERY NIGHT AT BEDTIME AS NEEDED FOR SLEEP 180 tablet 1   diclofenac Sodium (VOLTAREN ARTHRITIS PAIN) 1 % GEL Apply 2 gm to upper extremity joints and 4 gm to lower extremity joints up to 4x daily.  Max 32g total per day 300 g 3   famotidine (PEPCID) 20 MG tablet Take 1 tablet (20 mg total) by mouth 2 (two) times daily as needed for heartburn. 180 tablet 1   fluticasone (FLONASE) 50 MCG/ACT nasal spray Place 2 sprays into both nostrils daily. 43 g 3   fluticasone-salmeterol (ADVAIR) 100-50 MCG/ACT AEPB Inhale 1 puff into the lungs 2 (two) times daily. **Rinse mouth after each use** 60 each 3   HYDROcodone-acetaminophen (NORCO/VICODIN) 5-325 MG  tablet Take 1 tablet by mouth every 8 (eight) hours as needed for moderate pain. 30 tablet 0   indomethacin (INDOCIN) 25 MG capsule Take 1 to 2 capsules three times daily as needed for headache. 20 capsule 0   ipratropium (ATROVENT) 0.03 % nasal spray Place 2 sprays into both nostrils every 12 (twelve) hours. 30 mL 12   losartan (COZAAR) 50 MG tablet TAKE ONE TABLET BY  MOUTH DAILY 90 tablet 3   Melatonin 5 MG TABS Take 5 mg by mouth at bedtime.     meloxicam (MOBIC) 15 MG tablet Take 1 tablet (15 mg total) by mouth daily. 90 tablet 3   methylphenidate (RITALIN) 20 MG tablet Take 1 tablet (20 mg total) by mouth 2 (two) times daily. Use as needed for chronic fatigue 30 tablet 0   metoprolol tartrate (LOPRESSOR) 100 MG tablet Take 1 tablet (100 mg total) by mouth once for 1 dose. Take two hours prior to your cardiac CT 1 tablet 0   montelukast (SINGULAIR) 10 MG tablet Take 1 tablet (10 mg total) by mouth at bedtime. 90 tablet 3   pantoprazole (PROTONIX) 40 MG tablet Take 1 tablet (40 mg total) by mouth daily. 90 tablet 1   pregabalin (LYRICA) 100 MG capsule TAKE TWO CAPSULES BY MOUTH DAILY AT BEDTIME 180 capsule 1   rosuvastatin (CRESTOR) 10 MG tablet Take 1 tablet (10 mg total) by mouth daily. 90 tablet 3   sildenafil (VIAGRA) 100 MG tablet TAKE ONE-HALF TO ONE TABLET BY MOUTH DAILY AS NEEDED 6 tablet 11   sucralfate (CARAFATE) 1 g tablet Take 1 tablet (1 g total) by mouth 4 (four) times daily -  with meals and at bedtime. 40 tablet 0   SUMAtriptan (IMITREX) 100 MG tablet Take 1 tablet earliest onset of migraine.  May repeat in 2 hours if headache persists or recurs.  Maximum 2 tablets in 24 hours. 10 tablet 5   topiramate (TOPAMAX) 100 MG tablet Take 1 tablet (100 mg total) by mouth daily. 90 tablet 3   traZODone (DESYREL) 150 MG tablet TAKE ONE TABLET BY MOUTH EVERY NIGHT AT BEDTIME AS NEEDED FOR SLEEP 90 tablet 3   vitamin B-12 (CYANOCOBALAMIN) 500 MCG tablet Take 500 mcg by mouth daily.     No  current facility-administered medications on file prior to visit.    Review of Systems:  As per HPI- otherwise negative.   Physical Examination: There were no vitals filed for this visit. There were no vitals filed for this visit. There is no height or weight on file to calculate BMI. Ideal Body Weight:    GEN: no acute distress. HEENT: Atraumatic, Normocephalic.  Ears and Nose: No external deformity. CV: RRR, No M/G/R. No JVD. No thrill. No extra heart sounds. PULM: CTA B, no wheezes, crackles, rhonchi. No retractions. No resp. distress. No accessory muscle use. ABD: S, NT, ND, +BS. No rebound. No HSM. EXTR: No c/c/e PSYCH: Normally interactive. Conversant.    Assessment and Plan: ***  Signed Abbe Amsterdam, MD

## 2022-07-05 NOTE — Telephone Encounter (Signed)
Would you like him to be seen in the 3pm slot?

## 2022-07-06 ENCOUNTER — Ambulatory Visit (INDEPENDENT_AMBULATORY_CARE_PROVIDER_SITE_OTHER): Payer: Medicare Other | Admitting: Family Medicine

## 2022-07-06 ENCOUNTER — Encounter: Payer: Self-pay | Admitting: Family Medicine

## 2022-07-06 ENCOUNTER — Ambulatory Visit (HOSPITAL_BASED_OUTPATIENT_CLINIC_OR_DEPARTMENT_OTHER)
Admission: RE | Admit: 2022-07-06 | Discharge: 2022-07-06 | Disposition: A | Discharge status: Home / Self Care | Payer: Medicare Other | Source: Ambulatory Visit | Attending: Family Medicine | Admitting: Family Medicine

## 2022-07-06 VITALS — BP 112/60 | HR 70 | Temp 98.5°F | Resp 18 | Ht 72.0 in | Wt 190.8 lb

## 2022-07-06 DIAGNOSIS — R1013 Epigastric pain: Secondary | ICD-10-CM

## 2022-07-06 LAB — TROPONIN I: Troponin I: <3 ng/L (ref <=47)

## 2022-07-06 MED ORDER — HYOSCYAMINE SULFATE 0.125 MG SL SUBL
0.2500 mg | SUBLINGUAL_TABLET | Freq: Once | SUBLINGUAL | Status: AC
Start: 2022-07-06 — End: 2022-07-06
  Administered 2022-07-06: 0.25 mg via SUBLINGUAL

## 2022-07-06 MED ORDER — ALUM & MAG HYDROXIDE-SIMETH 200-200-20 MG/5ML PO SUSP
30.0000 mL | Freq: Once | ORAL | Status: AC
Start: 2022-07-06 — End: 2022-07-06
  Administered 2022-07-06: 30 mL via ORAL

## 2022-07-06 MED ORDER — LIDOCAINE VISCOUS HCL 2 % MT SOLN
15.0000 mL | Freq: Once | OROMUCOSAL | Status: AC
Start: 2022-07-06 — End: 2022-07-06
  Administered 2022-07-06: 15 mL via OROMUCOSAL

## 2022-07-06 NOTE — Patient Instructions (Addendum)
Good to see you today- I am sorry you are not feeling well I will be in touch with your results I will also contact GI about moving up your appt   Let's increase pantoprazole to twice a day for now- 40 mg morning and evening   Please contact me if you are not doing ok

## 2022-07-07 ENCOUNTER — Encounter: Payer: Self-pay | Admitting: Family Medicine

## 2022-07-07 DIAGNOSIS — R1013 Epigastric pain: Secondary | ICD-10-CM

## 2022-07-07 LAB — COMPREHENSIVE METABOLIC PANEL
ALT: 17 U/L (ref 0–53)
AST: 18 U/L (ref 0–37)
Albumin: 4.3 g/dL (ref 3.5–5.2)
Alkaline Phosphatase: 61 U/L (ref 39–117)
BUN: 16 mg/dL (ref 6–23)
CO2: 29 mEq/L (ref 19–32)
Calcium: 9.4 mg/dL (ref 8.4–10.5)
Chloride: 107 mEq/L (ref 96–112)
Creatinine, Ser: 1.1 mg/dL (ref 0.40–1.50)
GFR: 68.39 mL/min (ref >60.00)
Glucose, Bld: 89 mg/dL (ref 70–99)
Potassium: 4.1 mEq/L (ref 3.5–5.1)
Sodium: 141 mEq/L (ref 135–145)
Total Bilirubin: 0.4 mg/dL (ref 0.2–1.2)
Total Protein: 6.7 g/dL (ref 6.0–8.3)

## 2022-07-07 LAB — CBC
HCT: 43.2 % (ref 39.0–52.0)
Hemoglobin: 14.9 g/dL (ref 13.0–17.0)
MCHC: 34.5 g/dL (ref 30.0–36.0)
MCV: 92.9 fl (ref 78.0–100.0)
Platelets: 146 10*3/uL — ABNORMAL LOW (ref 150.0–400.0)
RBC: 4.64 Mil/uL (ref 4.22–5.81)
RDW: 13.1 % (ref 11.5–15.5)
WBC: 5 10*3/uL (ref 4.0–10.5)

## 2022-07-07 LAB — LIPASE: Lipase: 28 U/L (ref 11.0–59.0)

## 2022-07-08 ENCOUNTER — Ambulatory Visit (INDEPENDENT_AMBULATORY_CARE_PROVIDER_SITE_OTHER): Payer: Medicare Other | Admitting: *Deleted

## 2022-07-08 DIAGNOSIS — Z0000 Encounter for general adult medical examination without abnormal findings: Secondary | ICD-10-CM

## 2022-07-08 NOTE — Progress Notes (Signed)
Subjective:   Patrick [name] is a 72 y.o. male who presents for Medicare Annual/Subsequent preventive examination.  I connected with  Patrick [name] on 07/08/22 by a audio enabled telemedicine application and verified that I am speaking with the correct person using two identifiers.  Patient Location: Home  Provider Location: Office/Clinic  I discussed the limitations of evaluation and management by telemedicine. The patient expressed understanding and agreed to proceed.   Review of Systems     Cardiac Risk Factors include: advanced age (>73men, >36 women);male gender;hypertension     Objective:    There were no vitals filed for this visit. There is no height or weight on file to calculate BMI.     07/08/2022    9:40 AM 11/14/2021    6:59 PM 04/02/2021    8:55 AM 12/07/2020    7:48 AM 04/23/2020   11:08 AM 09/26/2015    2:41 PM 02/09/2015    4:00 PM  Advanced Directives  Does Patient Have a Medical Advance Directive? Yes No Yes Yes No No No  Type of Estate agent of [place];Living will  Healthcare Power of [place];Living will Healthcare Power of [place];Living will     Does patient want to make changes to medical advance directive? No - Patient declined  No - Patient declined      Copy of Healthcare Power of Attorney in Chart? No - copy requested  No - copy requested No - copy requested     Would patient like information on creating a medical advance directive?      No - patient declined information No - patient declined information    Current Medications (verified) Outpatient Encounter Medications as of 07/08/2022  Medication Sig   benzonatate (TESSALON) 200 MG capsule Take 1 capsule (200 mg total) by mouth 3 (three) times daily as needed for cough.   clonazePAM (KLONOPIN) 0.5 MG tablet TAKE ONE TO TWO TABLETS BY MOUTH EVERY NIGHT AT BEDTIME AS NEEDED FOR SLEEP   diclofenac Sodium (VOLTAREN ARTHRITIS PAIN) 1 % GEL Apply 2 gm to upper  extremity joints and 4 gm to lower extremity joints up to 4x daily.  Max 32g total per day   famotidine (PEPCID) 20 MG tablet Take 1 tablet (20 mg total) by mouth 2 (two) times daily as needed for heartburn.   fluticasone (FLONASE) 50 MCG/ACT nasal spray Place 2 sprays into both nostrils daily.   fluticasone-salmeterol (ADVAIR) 100-50 MCG/ACT AEPB Inhale 1 puff into the lungs 2 (two) times daily. **Rinse mouth after each use**   HYDROcodone-acetaminophen (NORCO/VICODIN) 5-325 MG tablet Take 1 tablet by mouth every 8 (eight) hours as needed for moderate pain.   indomethacin (INDOCIN) 25 MG capsule Take 1 to 2 capsules three times daily as needed for headache.   ipratropium (ATROVENT) 0.03 % nasal spray Place 2 sprays into both nostrils every 12 (twelve) hours.   losartan (COZAAR) 50 MG tablet TAKE ONE TABLET BY MOUTH DAILY   Melatonin 5 MG TABS Take 5 mg by mouth at bedtime.   meloxicam (MOBIC) 15 MG tablet Take 1 tablet (15 mg total) by mouth daily.   methylphenidate (RITALIN) 20 MG tablet Take 1 tablet (20 mg total) by mouth 2 (two) times daily. Use as needed for chronic fatigue   metoprolol tartrate (LOPRESSOR) 100 MG tablet Take 1 tablet (100 mg total) by mouth once for 1 dose. Take two hours prior to your cardiac CT   montelukast (SINGULAIR) 10 MG tablet Take 1  tablet (10 mg total) by mouth at bedtime.   pantoprazole (PROTONIX) 40 MG tablet Take 1 tablet (40 mg total) by mouth daily.   pregabalin (LYRICA) 100 MG capsule TAKE TWO CAPSULES BY MOUTH DAILY AT BEDTIME   rosuvastatin (CRESTOR) 10 MG tablet Take 1 tablet (10 mg total) by mouth daily.   sildenafil (VIAGRA) 100 MG tablet TAKE ONE-HALF TO ONE TABLET BY MOUTH DAILY AS NEEDED   sucralfate (CARAFATE) 1 g tablet Take 1 tablet (1 g total) by mouth 4 (four) times daily -  with meals and at bedtime.   SUMAtriptan (IMITREX) 100 MG tablet Take 1 tablet earliest onset of migraine.  May repeat in 2 hours if headache persists or recurs.  Maximum 2  tablets in 24 hours.   topiramate (TOPAMAX) 100 MG tablet Take 1 tablet (100 mg total) by mouth daily.   traZODone (DESYREL) 150 MG tablet TAKE ONE TABLET BY MOUTH EVERY NIGHT AT BEDTIME AS NEEDED FOR SLEEP   vitamin B-12 (CYANOCOBALAMIN) 500 MCG tablet Take 500 mcg by mouth daily.   No facility-administered encounter medications on file as of 07/08/2022.    Allergies (verified) Patient has no known allergies.   History: Past Medical History:  Diagnosis Date   Anxiety    Arthritis    Basal cell carcinoma    BPH (benign prostatic hyperplasia)    Depression    Dysphagia    Emphysema of lung (HCC)    Fibromyalgia    GERD (gastroesophageal reflux disease)    otc  tums   Headache    migraines   Herpes simplex type 1 infection    History of migraine    Hypertension    no longer on medication   Insomnia    Lower back pain    Morton's neuroma    OSA (obstructive sleep apnea)    Primary snoring    Recurrent canker sores    Sleep apnea    Urinary frequency    Past Surgical History:  Procedure Laterality Date   EPIDIDYMECTOMY     for spermatocele   EYE SURGERY Bilateral    lasik eye surgery   FOOT SURGERY Left    INGUINAL HERNIA REPAIR Left    01/25/1999   MOUTH SURGERY     pancreatic surgery r/t trauma     SEPTOPLASTY N/A 02/09/2015   Procedure: SEPTOPLASTY;  Surgeon: Drema Halon, MD;  Location: [facility] OR;  Service: ENT;  Laterality: N/A;   SKIN CANCER EXCISION     TONSILLECTOMY     TRANSURETHRAL INCISION OF PROSTATE     TURBINATE REDUCTION Bilateral 02/09/2015   Procedure: BILATERAL TURBINATE REDUCTION;  Surgeon: Drema Halon, MD;  Location: [facility] OR;  Service: ENT;  Laterality: Bilateral;   UVULOPALATOPHARYNGOPLASTY N/A 02/09/2015   Procedure: UVULOPALATOPHARYNGOPLASTY (UPPP);  Surgeon: Drema Halon, MD;  Location: [facility] OR;  Service: ENT;  Laterality: N/A;   Family History  Problem Relation Age of Onset   Coronary artery disease Mother    Stroke  Mother    Hypertension Father    Anxiety disorder Brother    Skin cancer Brother    Stroke Brother    Social History   Socioeconomic History   Marital status: Married    Spouse name: Not on file   Number of children: 2   Years of education: BS    Highest education level: Bachelor's degree (e.g., BA, AB, BS)  Occupational History   Occupation: Art gallery manager  Tobacco Use   Smoking status: Former  Packs/day: 2.00    Years: 30.00    Additional pack years: 0.00    Total pack years: 60.00    Types: Cigarettes    Start date: 03/1967    Quit date: 10/29/2005    Years since quitting: 16.7   Smokeless tobacco: Never  Substance and Sexual Activity   Alcohol use: Yes    Alcohol/week: 5.0 standard drinks of alcohol    Types: 2 Glasses of wine, 3 Cans of beer per week    Comment: 1 beer every other day   Drug use: No   Sexual activity: Yes    Birth control/protection: None  Other Topics Concern   Not on file  Social History Narrative   Denies caffeine use    Right handed   Social Determinants of Health   Financial Resource Strain: Low Risk  (07/05/2022)   Overall Financial Resource Strain (CARDIA)    Difficulty of Paying Living Expenses: Not very hard  Food Insecurity: No Food Insecurity (07/05/2022)   Hunger Vital Sign    Worried About Running Out of Food in the Last Year: Never true    Ran Out of Food in the Last Year: Never true  Transportation Needs: No Transportation Needs (07/05/2022)   PRAPARE - Administrator, Civil Service (Medical): No    Lack of Transportation (Non-Medical): No  Physical Activity: Sufficiently Active (07/05/2022)   Exercise Vital Sign    Days of Exercise per Week: 4 days    Minutes of Exercise per Session: 40 min  Stress: No Stress Concern Present (07/05/2022)   [employer] of Occupational Health - Occupational Stress Questionnaire    Feeling of Stress : Not at all  Social Connections: Socially Integrated (07/05/2022)   Social  Connection and Isolation Panel [NHANES]    Frequency of Communication with Friends and Family: More than three times a week    Frequency of Social Gatherings with Friends and Family: Once a week    Attends Religious Services: More than 4 times per year    Active Member of [employer] or Organizations: Yes    Attends Banker Meetings: 1 to 4 times per year    Marital Status: Married    Tobacco Counseling Counseling given: Not Answered   Clinical Intake:  Pre-visit preparation completed: Yes  Pain : No/denies pain  BMI - recorded: 25.88 Nutritional Status: BMI 25 -29 Overweight Nutritional Risks: None Diabetes: No  How often do you need to have someone help you when you read instructions, pamphlets, or other written materials from your doctor or pharmacy?: 1 - Never  Activities of Daily Living    07/08/2022    9:42 AM  In your present state of health, do you have any difficulty performing the following activities:  Hearing? 1  Comment wears hearing aids  Vision? 0  Difficulty concentrating or making decisions? 0  Walking or climbing stairs? 0  Dressing or bathing? 0  Doing errands, shopping? 0  Preparing Food and eating ? N  Using the Toilet? N  In the past six months, have you accidently leaked urine? N  Do you have problems with loss of bowel control? N  Managing your Medications? N  Managing your Finances? N  Housekeeping or managing your Housekeeping? N    Patient Care Team: Copland, Gwenlyn Found, MD as PCP - General (Family Medicine) Drema Halon, MD (Inactive) as Consulting Physician (Otolaryngology) Vilinda Blanks, PhD [facility]) Archer Asa, MD as Consulting Physician (Psychiatry) Reginia Naas  S, MD as Consulting Physician (Dermatology)  Indicate any recent Medical Services you may have received from other than [facility] providers in the past year (date may be approximate).     Assessment:   This is a routine wellness examination  for Miquan.  Hearing/Vision screen No results found.  Dietary issues and exercise activities discussed: Current Exercise Habits: Home exercise routine, Type of exercise: calisthenics;strength training/weights;walking, Time (Minutes): 20, Frequency (Times/Week): 7, Weekly Exercise (Minutes/Week): 140, Intensity: Mild, Exercise limited by: None identified   Goals Addressed   None    Depression Screen    07/08/2022    9:42 AM 12/20/2021    1:45 PM 12/07/2020    7:51 AM 10/09/2017   12:35 PM 03/23/2016   11:09 AM  PHQ 2/9 Scores  PHQ - 2 Score 0 0 0 2 0  PHQ- 9 Score  0  5     Fall Risk    07/08/2022    9:41 AM 12/20/2021    1:45 PM 12/07/2020    7:50 AM 04/23/2020   11:08 AM 03/23/2016   11:09 AM  Fall Risk   Falls in the past year? 0 0 0 0 Yes  Number falls in past yr: 0 0 0 0 1  Injury with Fall? 0 0 0 0 No  Risk for fall due to : No Fall Risks      Follow up Falls evaluation completed Falls evaluation completed Falls prevention discussed      FALL RISK PREVENTION PERTAINING TO THE HOME:  Any stairs in or around the home? Yes  If so, are there any without handrails? No  Home free of loose throw rugs in walkways, pet beds, electrical cords, etc? Yes  Adequate lighting in your home to reduce risk of falls? Yes   ASSISTIVE DEVICES UTILIZED TO PREVENT FALLS:  Life alert? No  Use of a cane, walker or w/c? No  Grab bars in the bathroom? No  Shower chair or bench in shower? No  Elevated toilet seat or a handicapped toilet? No   TIMED UP AND GO:  Was the test performed?  No,audio visit .    Cognitive Function:        07/08/2022    9:46 AM  6CIT Screen  What Year? 0 points  What month? 0 points  What time? 0 points  Count back from 20 0 points  Months in reverse 0 points  Repeat phrase 0 points  Total Score 0 points    Immunizations Immunization History  Administered Date(s) Administered   Fluad Quad(high Dose 65+) 12/18/2019, 12/07/2020, 12/20/2021    Influenza, High Dose Seasonal PF 12/03/2018   Influenza,inj,Quad PF,6+ Mos 12/15/2016   Influenza-Unspecified 01/12/2018   Moderna SARS-COV2 Booster Vaccination 06/22/2020   Moderna Sars-Covid-2 Vaccination 04/15/2019, 05/12/2019   Pfizer Covid-19 Vaccine Bivalent Booster 5yrs & up 12/07/2020   Pneumococcal Conjugate-13 05/03/2018   Pneumococcal Polysaccharide-23 07/31/2019   Tdap 07/20/2016   Zoster Recombinat (Shingrix) 07/28/2016, 10/28/2016   Zoster, Live 03/14/2013    TDAP status: Up to date  Flu Vaccine status: Up to date  Pneumococcal vaccine status: Up to date  Covid-19 vaccine status: Information provided on how to obtain vaccines.   Qualifies for Shingles Vaccine? Yes   Zostavax completed Yes   Shingrix Completed?: Yes  Screening Tests Health Maintenance  Topic Date Due   COVID-19 Vaccine (4 - 2023-24 season) 11/12/2021   Medicare Annual Wellness (AWV)  12/07/2021   INFLUENZA VACCINE  10/13/2022   COLONOSCOPY (Pts  45-72yrs Insurance coverage will need to be confirmed)  06/12/2025   DTaP/Tdap/Td (2 - Td or Tdap) 07/21/2026   Pneumonia Vaccine 34+ Years old  Completed   Hepatitis C Screening  Completed   Zoster Vaccines- Shingrix  Completed   HPV VACCINES  Aged Out    Health Maintenance  Health Maintenance Due  Topic Date Due   COVID-19 Vaccine (4 - 2023-24 season) 11/12/2021   Medicare Annual Wellness (AWV)  12/07/2021    Colorectal cancer screening: Type of screening: Colonoscopy. Completed 06/13/15. Repeat every 10 years  Lung Cancer Screening: (Low Dose CT Chest recommended if Age 66-80 years, 30 pack-year currently smoking OR have quit w/in 15years.) does not qualify.   Additional Screening:  Hepatitis C Screening: does qualify; Completed 06/13/15  Vision Screening: Recommended annual ophthalmology exams for early detection of glaucoma and other disorders of the eye. Is the patient up to date with their annual eye exam?  Yes  Who is the provider or  what is the name of the office in which the patient attends annual eye exams? [facility] If pt is not established with a provider, would they like to be referred to a provider to establish care? No .   Dental Screening: Recommended annual dental exams for proper oral hygiene  Community Resource Referral / Chronic Care Management: CRR required this visit?  No   CCM required this visit?  No      Plan:     I have personally reviewed and noted the following in the patient's chart:   Medical and social history Use of alcohol, tobacco or illicit drugs  Current medications and supplements including opioid prescriptions. Patient is currently taking opioid prescriptions. Information provided to patient regarding non-opioid alternatives. Patient advised to discuss non-opioid treatment plan with their provider. Functional ability and status Nutritional status Physical activity Advanced directives List of other physicians Hospitalizations, surgeries, and ER visits in previous 12 months Vitals Screenings to include cognitive, depression, and falls Referrals and appointments  In addition, I have reviewed and discussed with patient certain preventive protocols, quality metrics, and best practice recommendations. A written personalized care plan for preventive services as well as general preventive health recommendations were provided to patient.   Due to this being a telephonic visit, the after visit summary with patients personalized plan was offered to patient via mail or my-chart. Patient would like to access on my-chart.   Donne Anon, [place]   07/08/2022   Nurse Notes: None

## 2022-07-08 NOTE — Patient Instructions (Signed)
Patrick [name] , Thank you for taking time to come for your Medicare Wellness Visit. I appreciate your ongoing commitment to your health goals. Please review the following plan we discussed and let me know if I can assist you in the future.     This is a list of the screening recommended for you and due dates:  Health Maintenance  Topic Date Due   COVID-19 Vaccine (4 - 2023-24 season) 11/12/2021   Flu Shot  10/13/2022   Medicare Annual Wellness Visit  07/08/2023   Colon Cancer Screening  06/12/2025   DTaP/Tdap/Td vaccine (2 - Td or Tdap) 07/21/2026   Pneumonia Vaccine  Completed   Hepatitis C Screening: USPSTF Recommendation to screen - Ages 83-79 yo.  Completed   Zoster (Shingles) Vaccine  Completed   HPV Vaccine  Aged Out    Next appointment: Follow up in one year for your annual wellness visit.   Preventive Care 43 Years and Older, Male Preventive care refers to lifestyle choices and visits with your health care provider that can promote health and wellness. What does preventive care include? A yearly physical exam. This is also called an annual well check. Dental exams once or twice a year. Routine eye exams. Ask your health care provider how often you should have your eyes checked. Personal lifestyle choices, including: Daily care of your teeth and gums. Regular physical activity. Eating a healthy diet. Avoiding tobacco and drug use. Limiting alcohol use. Practicing safe sex. Taking low doses of aspirin every day. Taking vitamin and mineral supplements as recommended by your health care provider. What happens during an annual well check? The services and screenings done by your health care provider during your annual well check will depend on your age, overall health, lifestyle risk factors, and family history of disease. Counseling  Your health care provider may ask you questions about your: Alcohol use. Tobacco use. Drug use. Emotional well-being. Home and  relationship well-being. Sexual activity. Eating habits. History of falls. Memory and ability to understand (cognition). Work and work Astronomer. Screening  You may have the following tests or measurements: Height, weight, and BMI. Blood pressure. Lipid and cholesterol levels. These may be checked every 5 years, or more frequently if you are over 6 years old. Skin check. Lung cancer screening. You may have this screening every year starting at age 26 if you have a 30-pack-year history of smoking and currently smoke or have quit within the past 15 years. Fecal occult blood test (FOBT) of the stool. You may have this test every year starting at age 48. Flexible sigmoidoscopy or colonoscopy. You may have a sigmoidoscopy every 5 years or a colonoscopy every 10 years starting at age 13. Prostate cancer screening. Recommendations will vary depending on your family history and other risks. Hepatitis C blood test. Hepatitis B blood test. Sexually transmitted disease (STD) testing. Diabetes screening. This is done by checking your blood sugar (glucose) after you have not eaten for a while (fasting). You may have this done every 1-3 years. Abdominal aortic aneurysm (AAA) screening. You may need this if you are a current or former smoker. Osteoporosis. You may be screened starting at age 21 if you are at high risk. Talk with your health care provider about your test results, treatment options, and if necessary, the need for more tests. Vaccines  Your health care provider may recommend certain vaccines, such as: Influenza vaccine. This is recommended every year. Tetanus, diphtheria, and acellular pertussis (Tdap, Td) vaccine. You  may need a Td booster every 10 years. Zoster vaccine. You may need this after age 45. Pneumococcal 13-valent conjugate (PCV13) vaccine. One dose is recommended after age 84. Pneumococcal polysaccharide (PPSV23) vaccine. One dose is recommended after age 59. Talk to your  health care provider about which screenings and vaccines you need and how often you need them. This information is not intended to replace advice given to you by your health care provider. Make sure you discuss any questions you have with your health care provider. Document Released: 03/27/2015 Document Revised: 11/18/2015 Document Reviewed: 12/30/2014 Elsevier Interactive Patient Education  2017 [employer] Prevention in the Home Falls can cause injuries. They can happen to people of all ages. There are many things you can do to make your home safe and to help prevent falls. What can I do on the outside of my home? Regularly fix the edges of walkways and driveways and fix any cracks. Remove anything that might make you trip as you walk through a door, such as a raised step or threshold. Trim any bushes or trees on the path to your home. Use bright outdoor lighting. Clear any walking paths of anything that might make someone trip, such as rocks or tools. Regularly check to see if handrails are loose or broken. Make sure that both sides of any steps have handrails. Any raised decks and porches should have guardrails on the edges. Have any leaves, snow, or ice cleared regularly. Use sand or salt on walking paths during winter. Clean up any spills in your garage right away. This includes oil or grease spills. What can I do in the bathroom? Use night lights. Install grab bars by the toilet and in the tub and shower. Do not use towel bars as grab bars. Use non-skid mats or decals in the tub or shower. If you need to sit down in the shower, use a plastic, non-slip stool. Keep the floor dry. Clean up any water that spills on the floor as soon as it happens. Remove soap buildup in the tub or shower regularly. Attach bath mats securely with double-sided non-slip rug tape. Do not have throw rugs and other things on the floor that can make you trip. What can I do in the bedroom? Use night  lights. Make sure that you have a light by your bed that is easy to reach. Do not use any sheets or blankets that are too big for your bed. They should not hang down onto the floor. Have a firm chair that has side arms. You can use this for support while you get dressed. Do not have throw rugs and other things on the floor that can make you trip. What can I do in the kitchen? Clean up any spills right away. Avoid walking on wet floors. Keep items that you use a lot in easy-to-reach places. If you need to reach something above you, use a strong step stool that has a grab bar. Keep electrical cords out of the way. Do not use floor polish or wax that makes floors slippery. If you must use wax, use non-skid floor wax. Do not have throw rugs and other things on the floor that can make you trip. What can I do with my stairs? Do not leave any items on the stairs. Make sure that there are handrails on both sides of the stairs and use them. Fix handrails that are broken or loose. Make sure that handrails are as long as the  stairways. Check any carpeting to make sure that it is firmly attached to the stairs. Fix any carpet that is loose or worn. Avoid having throw rugs at the top or bottom of the stairs. If you do have throw rugs, attach them to the floor with carpet tape. Make sure that you have a light switch at the top of the stairs and the bottom of the stairs. If you do not have them, ask someone to add them for you. What else can I do to help prevent falls? Wear shoes that: Do not have high heels. Have rubber bottoms. Are comfortable and fit you well. Are closed at the toe. Do not wear sandals. If you use a stepladder: Make sure that it is fully opened. Do not climb a closed stepladder. Make sure that both sides of the stepladder are locked into place. Ask someone to hold it for you, if possible. Clearly mark and make sure that you can see: Any grab bars or handrails. First and last  steps. Where the edge of each step is. Use tools that help you move around (mobility aids) if they are needed. These include: Canes. Walkers. Scooters. Crutches. Turn on the lights when you go into a dark area. Replace any light bulbs as soon as they burn out. Set up your furniture so you have a clear path. Avoid moving your furniture around. If any of your floors are uneven, fix them. If there are any pets around you, be aware of where they are. Review your medicines with your doctor. Some medicines can make you feel dizzy. This can increase your chance of falling. Ask your doctor what other things that you can do to help prevent falls. This information is not intended to replace advice given to you by your health care provider. Make sure you discuss any questions you have with your health care provider. Document Released: 12/25/2008 Document Revised: 08/06/2015 Document Reviewed: 04/04/2014 Elsevier Interactive Patient Education  2017 [employer].

## 2022-07-11 ENCOUNTER — Encounter: Payer: Self-pay | Admitting: Gastroenterology

## 2022-07-19 ENCOUNTER — Ambulatory Visit (INDEPENDENT_AMBULATORY_CARE_PROVIDER_SITE_OTHER): Payer: Medicare Other | Admitting: Gastroenterology

## 2022-07-19 ENCOUNTER — Encounter: Payer: Self-pay | Admitting: Gastroenterology

## 2022-07-19 VITALS — BP 128/72 | HR 94 | Ht 72.0 in | Wt 187.0 lb

## 2022-07-19 DIAGNOSIS — R142 Eructation: Secondary | ICD-10-CM | POA: Diagnosis not present

## 2022-07-19 DIAGNOSIS — R0789 Other chest pain: Secondary | ICD-10-CM

## 2022-07-19 NOTE — Progress Notes (Signed)
[facility] Consult Note:  History: Patrick [name] 07/19/2022  Referring provider: Pearline Cables, MD  Reason for consult/chief complaint: Gastroesophageal Reflux, Abdominal Pain (Epigastric), and Gas   Subjective  HPI: This is a 72 year old man previously seen by Dr. Wendall Papa at this office August 2017 for evaluation of left-sided chest and upper abdominal pain.  Clinical details in that office consult note, but essentially pain as described above with an ED visit for evaluation, negative ACS workup.  Described as a sharp stabbing pain as well as about 2 years of intermittent solid food dysphagia.  Dr. Birdena Crandall exam suggested pain was probably musculoskeletal of the chest wall.  Upper endoscopy was advised because of the reported dysphagia. EGD 11/03/2015 describes some mild diffuse mucosal changes characterized as gastritis, biopsies negative for H. pylori or intestinal metaplasia.  Esophagus was normal, note dilation performed.  He is now referred back to [place] after primary care visit on 07/06/2022 when he was describing a few weeks of burning chest and upper abdominal discomfort while taking pantoprazole 40 mg daily, Pepcid 20 mg twice daily and ibuprofen so back on some Carafate he had leftover.  No dyspnea, no exertional component.  No change with eating. _________________  Patrick [name] describes a left lower anterior sharp knifelike chest pain that is nonexertional and has been bothering him now for at least 2 months.  He has associated belching and feeling of bloating.  Denies dysphagia odynophagia vomiting.  Pain not changed with respirations or movement. He also has intermittent constipation that he thinks may be side effect of 1 or more of his meds.  This has been a longer-term issue, he is risks of laxative yesterday with relief after having had no BM for few days.  He has been taking multiple antiacids including PPI, H2 blocker, Maalox and some Carafate that  he had at home and out of it has provided relief of the symptoms. ROS:  Review of Systems Denies shortness of breath Reports arthritic symptoms and myalgias, takes NSAID daily Anxiety Fatigue Remainder systems negative except as above  Past Medical History: Past Medical History:  Diagnosis Date   Anxiety    Arthritis    Basal cell carcinoma    BPH (benign prostatic hyperplasia)    Depression    Dysphagia    Emphysema of lung (HCC)    Fibromyalgia    GERD (gastroesophageal reflux disease)    otc  tums   Headache    migraines   Herpes simplex type 1 infection    History of migraine    Hypertension    no longer on medication   Insomnia    Lower back pain    Morton's neuroma    OSA (obstructive sleep apnea)    Primary snoring    Recurrent canker sores    Sleep apnea    Urinary frequency      Past Surgical History: Past Surgical History:  Procedure Laterality Date   EPIDIDYMECTOMY     for spermatocele   EYE SURGERY Bilateral    lasik eye surgery   FOOT SURGERY Left    INGUINAL HERNIA REPAIR Left    01/25/1999   MOUTH SURGERY     pancreatic surgery r/t trauma     SEPTOPLASTY N/A 02/09/2015   Procedure: SEPTOPLASTY;  Surgeon: Drema Halon, MD;  Location: [facility] OR;  Service: ENT;  Laterality: N/A;   SKIN CANCER EXCISION     TONSILLECTOMY     TRANSURETHRAL INCISION OF PROSTATE  TURBINATE REDUCTION Bilateral 02/09/2015   Procedure: BILATERAL TURBINATE REDUCTION;  Surgeon: Drema Halon, MD;  Location: [facility] OR;  Service: ENT;  Laterality: Bilateral;   UVULOPALATOPHARYNGOPLASTY N/A 02/09/2015   Procedure: UVULOPALATOPHARYNGOPLASTY (UPPP);  Surgeon: Drema Halon, MD;  Location[facility] OR;  Service: ENT;  Laterality: N/A;     Family History: Family History  Problem Relation Age of Onset   Coronary artery disease Mother    Stroke Mother    Hypertension Father    Anxiety disorder Brother    Skin cancer Brother    Stroke Brother    Colon cancer  Neg Hx    Esophageal cancer Neg Hx    Stomach cancer Neg Hx     Social History: Social History   Socioeconomic History   Marital status: Married    Spouse name: Not on file   Number of children: 2   Years of education: BS    Highest education level: Bachelor's degree (e.g., BA, AB, BS)  Occupational History   Occupation: Art gallery manager   Occupation: Retired  Tobacco Use   Smoking status: Former    Packs/day: 2.00    Years: 30.00    Additional pack years: 0.00    Total pack years: 60.00    Types: Cigarettes    Start date: 03/1967    Quit date: 10/29/2005    Years since quitting: 16.7   Smokeless tobacco: Never  Vaping Use   Vaping Use: Never used  Substance and Sexual Activity   Alcohol use: Yes    Alcohol/week: 5.0 standard drinks of alcohol    Types: 2 Glasses of wine, 3 Cans of beer per week    Comment: 1 beer every other day   Drug use: No   Sexual activity: Yes    Birth control/protection: None  Other Topics Concern   Not on file  Social History Narrative   Denies caffeine use    Right handed   Social Determinants of Health   Financial Resource Strain: Low Risk  (07/05/2022)   Overall Financial Resource Strain (CARDIA)    Difficulty of Paying Living Expenses: Not very hard  Food Insecurity: No Food Insecurity (07/05/2022)   Hunger Vital Sign    Worried About Running Out of Food in the Last Year: Never true    Ran Out of Food in the Last Year: Never true  Transportation Needs: No Transportation Needs (07/05/2022)   PRAPARE - Administrator, Civil Service (Medical): No    Lack of Transportation (Non-Medical): No  Physical Activity: Sufficiently Active (07/05/2022)   Exercise Vital Sign    Days of Exercise per Week: 4 days    Minutes of Exercise per Session: 40 min  Stress: No Stress Concern Present (07/05/2022)   [employer] of Occupational Health - Occupational Stress Questionnaire    Feeling of Stress : Not at all  Social Connections: Socially  Integrated (07/05/2022)   Social Connection and Isolation Panel [NHANES]    Frequency of Communication with Friends and Family: More than three times a week    Frequency of Social Gatherings with Friends and Family: Once a week    Attends Religious Services: More than 4 times per year    Active Member of [employer] or Organizations: Yes    Attends Banker Meetings: 1 to 4 times per year    Marital Status: Married    Allergies: No Known Allergies  Outpatient Meds: Current Outpatient Medications  Medication Sig Dispense Refill   benzonatate (  TESSALON) 200 MG capsule Take 1 capsule (200 mg total) by mouth 3 (three) times daily as needed for cough. 60 capsule 3   clonazePAM (KLONOPIN) 0.5 MG tablet TAKE ONE TO TWO TABLETS BY MOUTH EVERY NIGHT AT BEDTIME AS NEEDED FOR SLEEP 180 tablet 1   diclofenac Sodium (VOLTAREN ARTHRITIS PAIN) 1 % GEL Apply 2 gm to upper extremity joints and 4 gm to lower extremity joints up to 4x daily.  Max 32g total per day 300 g 3   famotidine (PEPCID) 20 MG tablet Take 1 tablet (20 mg total) by mouth 2 (two) times daily as needed for heartburn. 180 tablet 1   fluticasone (FLONASE) 50 MCG/ACT nasal spray Place 2 sprays into both nostrils daily. 43 g 3   fluticasone-salmeterol (ADVAIR) 100-50 MCG/ACT AEPB Inhale 1 puff into the lungs 2 (two) times daily. **Rinse mouth after each use** 60 each 3   HYDROcodone-acetaminophen (NORCO/VICODIN) 5-325 MG tablet Take 1 tablet by mouth every 8 (eight) hours as needed for moderate pain. 30 tablet 0   indomethacin (INDOCIN) 25 MG capsule Take 1 to 2 capsules three times daily as needed for headache. 20 capsule 0   ipratropium (ATROVENT) 0.03 % nasal spray Place 2 sprays into both nostrils every 12 (twelve) hours. 30 mL 12   losartan (COZAAR) 50 MG tablet TAKE ONE TABLET BY MOUTH DAILY 90 tablet 3   Melatonin 5 MG TABS Take 5 mg by mouth at bedtime.     meloxicam (MOBIC) 15 MG tablet Take 1 tablet (15 mg total) by mouth  daily. 90 tablet 3   methylphenidate (RITALIN) 20 MG tablet Take 1 tablet (20 mg total) by mouth 2 (two) times daily. Use as needed for chronic fatigue 30 tablet 0   metoprolol tartrate (LOPRESSOR) 100 MG tablet Take 1 tablet (100 mg total) by mouth once for 1 dose. Take two hours prior to your cardiac CT 1 tablet 0   montelukast (SINGULAIR) 10 MG tablet Take 1 tablet (10 mg total) by mouth at bedtime. 90 tablet 3   pantoprazole (PROTONIX) 40 MG tablet Take 1 tablet (40 mg total) by mouth daily. 90 tablet 1   pregabalin (LYRICA) 100 MG capsule TAKE TWO CAPSULES BY MOUTH DAILY AT BEDTIME 180 capsule 1   rosuvastatin (CRESTOR) 10 MG tablet Take 1 tablet (10 mg total) by mouth daily. 90 tablet 3   sildenafil (VIAGRA) 100 MG tablet TAKE ONE-HALF TO ONE TABLET BY MOUTH DAILY AS NEEDED 6 tablet 11   sucralfate (CARAFATE) 1 g tablet Take 1 tablet (1 g total) by mouth 4 (four) times daily -  with meals and at bedtime. 40 tablet 0   SUMAtriptan (IMITREX) 100 MG tablet Take 1 tablet earliest onset of migraine.  May repeat in 2 hours if headache persists or recurs.  Maximum 2 tablets in 24 hours. 10 tablet 5   topiramate (TOPAMAX) 100 MG tablet Take 1 tablet (100 mg total) by mouth daily. 90 tablet 3   traZODone (DESYREL) 150 MG tablet TAKE ONE TABLET BY MOUTH EVERY NIGHT AT BEDTIME AS NEEDED FOR SLEEP 90 tablet 3   vitamin B-12 (CYANOCOBALAMIN) 500 MCG tablet Take 500 mcg by mouth daily.     No current facility-administered medications for this visit.      ___________________________________________________________________ Objective   Exam:  BP 128/72   Pulse 94   Ht 6' (1.829 m)   Wt 187 lb (84.8 kg)   SpO2 95%   BMI 25.36 kg/m  Wt Readings  from Last 3 Encounters:  07/19/22 187 lb (84.8 kg)  07/06/22 190 lb 12.8 oz (86.5 kg)  05/16/22 189 lb 9.6 oz (86 kg)    General: He is not acutely ill-appearing Eyes: sclera anicteric, no redness ENT: oral mucosa moist without lesions, no cervical  or supraclavicular lymphadenopathy CV: Regular without appreciable murmur, no JVD, no peripheral edema Resp: clear to auscultation bilaterally, normal RR and effort noted He points to a half dollar sized area on the anterior left lower chest wall as location of this pain.  Overlying skin is normal, no palpable fracture, visible abnormality or tenderness.  He has no tenderness in the epigastrium either. GI: soft, no tenderness, with active bowel sounds. No guarding or palpable organomegaly noted. Skin; warm and dry, no rash or jaundice noted Neuro: awake, alert and oriented x 3. Normal gross motor function and fluent speech  Labs:     Latest Ref Rng & Units 07/06/2022    3:43 PM 05/16/2022   11:53 AM 11/14/2021    8:21 PM  CBC  WBC 4.0 - 10.5 K/uL 5.0  3.7  3.7   Hemoglobin 13.0 - 17.0 g/dL 16.1  09.6  04.5   Hematocrit 39.0 - 52.0 % 43.2  43.8  41.7   Platelets 150.0 - 400.0 K/uL 146.0  138.0  132       Latest Ref Rng & Units 07/06/2022    3:43 PM 05/16/2022   11:53 AM 12/20/2021    2:09 PM  CMP  Glucose 70 - 99 mg/dL 89  88  409   BUN 6 - 23 mg/dL 16  21  18    Creatinine 0.40 - 1.50 mg/dL 8.11  9.14  7.82   Sodium 135 - 145 mEq/L 141  142  142   Potassium 3.5 - 5.1 mEq/L 4.1  4.0  3.9   Chloride 96 - 112 mEq/L 107  109  109   CO2 19 - 32 mEq/L 29  27  24    Calcium 8.4 - 10.5 mg/dL 9.4  9.4  9.4   Total Protein 6.0 - 8.3 g/dL 6.7   6.8   Total Bilirubin 0.2 - 1.2 mg/dL 0.4   0.6   Alkaline Phos 39 - 117 U/L 61   55   AST 0 - 37 U/L 18   16   ALT 0 - 53 U/L 17   13    Negative troponin on 07/06/2022  Radiologic Studies:   CLINICAL DATA:  Chest pain without injury.   EXAM: CHEST - 2 VIEW   COMPARISON:  February 16, 2022.   FINDINGS: The heart size and mediastinal contours are within normal limits. Both lungs are clear. The visualized skeletal structures are unremarkable.   IMPRESSION: No active cardiopulmonary disease.     Electronically Signed   By: Lupita Raider  M.D.   On: 07/06/2022 16:06   Assessment: Encounter Diagnoses  Name Primary?   Chest wall pain Yes   Belching     I do not think this man's chest wall pain is digestive in nature.  His description is identical to when he saw Dr. Christella Hartigan in 2017 in terms of both its nature, location, physical exam finding.  Negative upper endoscopy at that time.  I understand he has belching and bloating, but I think these are ancillary symptoms to the pain and not suggestive of a GI condition is the underlying source of pain. I believe this explains why he has had no improvement on multiple  acid reducing medications or Carafate.  This seems like a musculoskeletal cause of pain.  He says he has what sounds like some NSAID gel at home that he sometimes uses on his joints, I recommended he try that over this area for the next few days.  Would not recommend longer use than that since he does take meloxicam daily.  If that is not helpful, recommend an over-the-counter lidocaine pain patch.  I have referred him back to primary care to arrange a CT scan of the chest, which will also see the upper abdomen.  This would be mainly to rule out a mass lesion, inflammatory process in the chest cavity or fluid collection.  I told him I am very doubtful that an upper endoscopy would be revealing given everything noted above.  He seems somewhat disappointed as he is understandably hoping for an answer and solution to this bothersome pain.  I am open to doing the endoscopy if his scan is unrevealing and he gets no relief from the treatments for what I believed to be musculoskeletal pain.  Massagee will be conveyed to primary care.  Since he is not improving with acid suppression therapy, I think he should wean off all of those things in the next week or so.  Thank you for the courtesy of this consult.  Please call me with any questions or concerns.  Patrick [name]  CC: Referring provider noted above

## 2022-07-19 NOTE — Patient Instructions (Signed)
If your blood pressure at your visit was 140/90 or greater, please contact your primary care physician to follow up on this. ______________________________________________________  If you are age 70 or older, your body mass index should be between 23-30. Your Body mass index is 25.36 kg/m. If this is out of the aforementioned range listed, please consider follow up with your Primary Care Provider.  If you are age 24 or younger, your body mass index should be between 19-25. Your Body mass index is 25.36 kg/m. If this is out of the aformentioned range listed, please consider follow up with your Primary Care Provider.  ________________________________________________________  The  GI providers would like to encourage you to use [facility] to communicate with providers for non-urgent requests or questions.  Due to long hold times on the telephone, sending your provider a message by [facility] may be a faster and more efficient way to get a response.  Please allow 48 business hours for a response.  Please remember that this is for non-urgent requests.  _______________________________________________________  Due to recent changes in healthcare laws, you may see the results of your imaging and laboratory studies on MyChart before your provider has had a chance to review them.  We understand that in some cases there may be results that are confusing or concerning to you. Not all laboratory results come back in the same time frame and the provider may be waiting for multiple results in order to interpret others.  Please give [place] 48 hours in order for your provider to thoroughly review all the results before contacting the office for clarification of your results.   It was a pleasure to see you today!  Thank you for trusting me with your gastrointestinal care!

## 2022-07-20 ENCOUNTER — Encounter: Payer: Self-pay | Admitting: Family Medicine

## 2022-07-20 DIAGNOSIS — R0789 Other chest pain: Secondary | ICD-10-CM

## 2022-07-22 ENCOUNTER — Ambulatory Visit (INDEPENDENT_AMBULATORY_CARE_PROVIDER_SITE_OTHER): Payer: Medicare Other | Admitting: Podiatry

## 2022-07-22 ENCOUNTER — Encounter: Payer: Self-pay | Admitting: Podiatry

## 2022-07-22 ENCOUNTER — Ambulatory Visit (INDEPENDENT_AMBULATORY_CARE_PROVIDER_SITE_OTHER): Payer: Medicare Other

## 2022-07-22 DIAGNOSIS — M722 Plantar fascial fibromatosis: Secondary | ICD-10-CM

## 2022-07-22 DIAGNOSIS — M79671 Pain in right foot: Secondary | ICD-10-CM | POA: Diagnosis not present

## 2022-07-22 DIAGNOSIS — G5761 Lesion of plantar nerve, right lower limb: Secondary | ICD-10-CM

## 2022-07-22 MED ORDER — TRIAMCINOLONE ACETONIDE 10 MG/ML IJ SUSP
10.0000 mg | Freq: Once | INTRAMUSCULAR | Status: AC
Start: 2022-07-22 — End: 2022-07-22
  Administered 2022-07-22: 10 mg

## 2022-07-22 NOTE — Patient Instructions (Signed)

## 2022-07-24 ENCOUNTER — Encounter: Payer: Self-pay | Admitting: Gastroenterology

## 2022-07-24 DIAGNOSIS — R0789 Other chest pain: Secondary | ICD-10-CM

## 2022-07-24 DIAGNOSIS — R142 Eructation: Secondary | ICD-10-CM

## 2022-07-24 NOTE — Progress Notes (Signed)
Subjective:   Patient ID: Patrick [name], male   DOB: 72 y.o.   MRN: [id number]   HPI Patient presents stating he had a lot of pain in the ball of the right foot for the last 6 to 8 months and that the heel started to hurt but not to the same degree.  States there is been no edema with it and has not tried treatment.  Patient does not smoke at this time likes to be active   Review of Systems  All other systems reviewed and are negative.       Objective:  Physical Exam Vitals and nursing note reviewed.  Constitutional:      Appearance: He is well-developed.  Pulmonary:     Effort: Pulmonary effort is normal.  Musculoskeletal:        General: Normal range of motion.  Skin:    General: Skin is warm.  Neurological:     Mental Status: He is alert.     Neurovascular status intact muscle strength adequate range of motion adequate with patient found to have exquisite discomfort in the third interspace of the right foot with fluid buildup around the area with a positive Yancey Flemings sign noted with mild discomfort into the MPJs.  Good digital perfusion well-oriented     Assessment:  Possibility for neuroma symptomatology versus inflammatory capsulitis or other inflammatory condition     Plan:  H&P reviewed different conditions and x-rays today I went ahead I did sterile prep I injected the third interspace right with a combination of Xylocaine Marcaine and steroid consisting of dexamethasone Kenalog to reduce inflammation around the nerve root see the results with the Possibility of excision depending on response or other treatment  X-rays indicate that there is a normal metatarsal parabola the bones are slightly close to each other but no other pathology noted

## 2022-07-25 NOTE — Telephone Encounter (Signed)
Called and spoke with patient to schedule EGD in the [facility]. Pt has been scheduled for EGD on 08/11/22 at 10 am. Pt has been advised that he will need to arrive by 9 am with a care partner, and care partner must remain at the facility the entire time. Pt knows to expect his instructions via MyChart. Pt will check date with his wife, he will call back if he needs to reschedule. Pt had no concerns at the end of the call.  Ambulatory referral to GI in epic. EGD instructions sent to patient via MyChart.

## 2022-07-25 NOTE — Telephone Encounter (Signed)
He can be scheduled for an EGD in my next available [facility] slot.  - HD

## 2022-07-25 NOTE — Addendum Note (Signed)
Addended by: Missy Sabins on: 07/25/2022 02:51 PM   Modules accepted: Orders

## 2022-08-05 ENCOUNTER — Ambulatory Visit: Payer: Medicare Other | Admitting: Family Medicine

## 2022-08-07 ENCOUNTER — Encounter: Payer: Self-pay | Admitting: Certified Registered Nurse Anesthetist

## 2022-08-11 ENCOUNTER — Encounter: Payer: Self-pay | Admitting: Gastroenterology

## 2022-08-11 ENCOUNTER — Ambulatory Visit (AMBULATORY_SURGERY_CENTER): Payer: Medicare Other | Admitting: Gastroenterology

## 2022-08-11 ENCOUNTER — Other Ambulatory Visit: Payer: Self-pay | Admitting: Podiatry

## 2022-08-11 VITALS — BP 109/75 | HR 56 | Temp 99.1°F | Resp 7 | Ht 72.0 in | Wt 187.0 lb

## 2022-08-11 DIAGNOSIS — R142 Eructation: Secondary | ICD-10-CM | POA: Diagnosis not present

## 2022-08-11 DIAGNOSIS — G5761 Lesion of plantar nerve, right lower limb: Secondary | ICD-10-CM

## 2022-08-11 DIAGNOSIS — R0789 Other chest pain: Secondary | ICD-10-CM

## 2022-08-11 DIAGNOSIS — M79671 Pain in right foot: Secondary | ICD-10-CM

## 2022-08-11 DIAGNOSIS — M722 Plantar fascial fibromatosis: Secondary | ICD-10-CM

## 2022-08-11 MED ORDER — SODIUM CHLORIDE 0.9 % IV SOLN
500.0000 mL | INTRAVENOUS | Status: DC
Start: 2022-08-11 — End: 2022-08-11

## 2022-08-11 NOTE — Progress Notes (Signed)
Report given to PACU, vss 

## 2022-08-11 NOTE — Progress Notes (Signed)
1004 Robinul 0.1 mg IV given due large amount of secretions upon assessment.  MD made aware, vss 

## 2022-08-11 NOTE — Op Note (Signed)
[facility] Patient Name: Patrick [name] Procedure Date: 08/11/2022 9:56 AM MRN: [id number] Endoscopist: Sherilyn Cooter L. Myrtie Neither , MD, [id number] Age: 72 Referring MD:  Date of Birth: 06-07-1950 Gender: Male Account #: [id number] Procedure:                Upper GI endoscopy Indications:              Unexplained chest pain, Eructation                           see clinical details in 07/19/22 office note Medicines:                Monitored Anesthesia Care Procedure:                Pre-Anesthesia Assessment:                           - Prior to the procedure, a History and Physical                            was performed, and patient medications and                            allergies were reviewed. The patient's tolerance of                            previous anesthesia was also reviewed. The risks                            and benefits of the procedure and the sedation                            options and risks were discussed with the patient.                            All questions were answered, and informed consent                            was obtained. Prior Anticoagulants: The patient has                            taken no anticoagulant or antiplatelet agents. ASA                            Grade Assessment: III - A patient with severe                            systemic disease. After reviewing the risks and                            benefits, the patient was deemed in satisfactory                            condition to undergo the procedure.  After obtaining informed consent, the endoscope was                            passed under direct vision. Throughout the                            procedure, the patient's blood pressure, pulse, and                            oxygen saturations were monitored continuously. The                            GIF [id number] #[id number] was introduced through the                            mouth, and advanced to  the duodenal bulb. The upper                            GI endoscopy was somewhat difficult due to a                            J-shaped stomach which made pyloric intubation                            difficult. The patient tolerated the procedure well. Scope In: Scope Out: Findings:                 The larynx was normal.                           The esophagus was normal.                           The stomach was normal, other than incidentally                            noted to be J-shaped.                           The cardia and gastric fundus were normal on                            retroflexion.                           The duodenal bulb was normal. Scope could not be                            advanced beyond the duodenal bulb due to gastric                            anatomy. Complications:            No immediate complications. Estimated Blood Loss:     Estimated blood loss: none. Impression:               -  Normal larynx.                           - Normal esophagus.                           - Normal stomach.                           - Normal duodenal bulb.                           - No specimens collected.                           This patient has had an episodic and recurrent                            chest pain syndrome without discernible upper                            digestive cause. Recommendation:           - Patient has a contact number available for                            emergencies. The signs and symptoms of potential                            delayed complications were discussed with the                            patient. Return to normal activities tomorrow.                            Written discharge instructions were provided to the                            patient.                           - Resume previous diet.                           - Continue present medications.                           - Return to primary care  physician. Annaclaire Walsworth L. Myrtie Neither, MD 08/11/2022 10:18:45 AM This report has been signed electronically.

## 2022-08-11 NOTE — Patient Instructions (Addendum)
Please read handouts provided. Continue present medications. Return to primary care physician.   YOU HAD AN ENDOSCOPIC PROCEDURE TODAY AT THE [facility]:   Refer to the procedure report that was given to you for any specific questions about what was found during the examination.  If the procedure report does not answer your questions, please call your gastroenterologist to clarify.  If you requested that your care partner not be given the details of your procedure findings, then the procedure report has been included in a sealed envelope for you to review at your convenience later.  YOU SHOULD EXPECT: Some feelings of bloating in the abdomen. Passage of more gas than usual.  Walking can help get rid of the air that was put into your GI tract during the procedure and reduce the bloating. If you had a lower endoscopy (such as a colonoscopy or flexible sigmoidoscopy) you may notice spotting of blood in your stool or on the toilet paper. If you underwent a bowel prep for your procedure, you may not have a normal bowel movement for a few days.  Please Note:  You might notice some irritation and congestion in your nose or some drainage.  This is from the oxygen used during your procedure.  There is no need for concern and it should clear up in a day or so.  SYMPTOMS TO REPORT IMMEDIATELY:  Following lower endoscopy (colonoscopy or flexible sigmoidoscopy):  Excessive amounts of blood in the stool  Significant tenderness or worsening of abdominal pains  Swelling of the abdomen that is new, acute  Fever of 100F or higher.  For urgent or emergent issues, a gastroenterologist can be reached at any hour by calling [phone]. Do not use MyChart messaging for urgent concerns.    DIET:  We do recommend a small meal at first, but then you may proceed to your regular diet.  Drink plenty of fluids but you should avoid alcoholic beverages for 24 hours.  ACTIVITY:  You should plan to take it  easy for the rest of today and you should NOT DRIVE or use heavy machinery until tomorrow (because of the sedation medicines used during the test).    FOLLOW UP: Our staff will call the number listed on your records the next business day following your procedure.  We will call around 7:15- 8:00 am to check on you and address any questions or concerns that you may have regarding the information given to you following your procedure. If we do not reach you, we will leave a message.     If any biopsies were taken you will be contacted by phone or by letter within the next 1-3 weeks.  Please call us at [phone] if you have not heard about the biopsies in 3 weeks.    SIGNATURES/CONFIDENTIALITY: You and/or your care partner have signed paperwork which will be entered into your electronic medical record.  These signatures attest to the fact that that the information above on your After Visit Summary has been reviewed and is understood.  Full responsibility of the confidentiality of this discharge information lies with you and/or your care-partner.YOU HAD AN ENDOSCOPIC PROCEDURE TODAY AT THE [facility]:   Refer to the procedure report that was given to you for any specific questions about what was found during the examination.  If the procedure report does not answer your questions, please call your gastroenterologist to clarify.  If you requested that your care partner not be given the  details of your procedure findings, then the procedure report has been included in a sealed envelope for you to review at your convenience later.  YOU SHOULD EXPECT: Some feelings of bloating in the abdomen. Passage of more gas than usual.  Walking can help get rid of the air that was put into your GI tract during the procedure and reduce the bloating. If you had a lower endoscopy (such as a colonoscopy or flexible sigmoidoscopy) you may notice spotting of blood in your stool or on the toilet paper. If you  underwent a bowel prep for your procedure, you may not have a normal bowel movement for a few days.  Please Note:  You might notice some irritation and congestion in your nose or some drainage.  This is from the oxygen used during your procedure.  There is no need for concern and it should clear up in a day or so.  SYMPTOMS TO REPORT IMMEDIATELY:  Following upper endoscopy (EGD)  Vomiting of blood or coffee ground material  New chest pain or pain under the shoulder blades  Painful or persistently difficult swallowing  New shortness of breath  Fever of 100F or higher  Black, tarry-looking stools  For urgent or emergent issues, a gastroenterologist can be reached at any hour by calling [phone]. Do not use MyChart messaging for urgent concerns.    DIET:  We do recommend a small meal at first, but then you may proceed to your regular diet.  Drink plenty of fluids but you should avoid alcoholic beverages for 24 hours.  ACTIVITY:  You should plan to take it easy for the rest of today and you should NOT DRIVE or use heavy machinery until tomorrow (because of the sedation medicines used during the test).    FOLLOW UP: Our staff will call the number listed on your records the next business day following your procedure.  We will call around 7:15- 8:00 am to check on you and address any questions or concerns that you may have regarding the information given to you following your procedure. If we do not reach you, we will leave a message.     If any biopsies were taken you will be contacted by phone or by letter within the next 1-3 weeks.  Please call us at [phone] if you have not heard about the biopsies in 3 weeks.    SIGNATURES/CONFIDENTIALITY: You and/or your care partner have signed paperwork which will be entered into your electronic medical record.  These signatures attest to the fact that that the information above on your After Visit Summary has been reviewed and is  understood.  Full responsibility of the confidentiality of this discharge information lies with you and/or your care-partner.

## 2022-08-11 NOTE — Progress Notes (Signed)
No changes to clinical history since GI office visit on 07/19/22.  The patient is appropriate for an endoscopic procedure in the ambulatory setting.  - Amada Jupiter, MD

## 2022-08-12 ENCOUNTER — Telehealth: Payer: Self-pay

## 2022-08-12 NOTE — Telephone Encounter (Signed)
  Follow up Call-     08/11/2022    9:09 AM  Call back number  Post procedure Call Back phone  # [phone]  Permission to leave phone message Yes     Patient questions:  Do you have a fever, pain , or abdominal swelling? No. Pain Score  0 *  Have you tolerated food without any problems? Yes.    Have you been able to return to your normal activities? Yes.    Do you have any questions about your discharge instructions: Diet   No. Medications  No. Follow up visit  No.  Do you have questions or concerns about your Care? No.  Actions: * If pain score is 4 or above: No action needed, pain <4.

## 2022-08-18 ENCOUNTER — Ambulatory Visit: Payer: Medicare Other | Admitting: Gastroenterology

## 2022-08-18 ENCOUNTER — Ambulatory Visit: Payer: Medicare Other | Admitting: Family Medicine

## 2022-08-19 ENCOUNTER — Ambulatory Visit (INDEPENDENT_AMBULATORY_CARE_PROVIDER_SITE_OTHER): Payer: Medicare Other | Admitting: Podiatry

## 2022-08-19 ENCOUNTER — Encounter: Payer: Self-pay | Admitting: Podiatry

## 2022-08-19 DIAGNOSIS — M7751 Other enthesopathy of right foot: Secondary | ICD-10-CM

## 2022-08-19 DIAGNOSIS — M722 Plantar fascial fibromatosis: Secondary | ICD-10-CM

## 2022-08-19 DIAGNOSIS — G5761 Lesion of plantar nerve, right lower limb: Secondary | ICD-10-CM

## 2022-08-21 NOTE — Progress Notes (Signed)
Subjective:   Patient ID: Patrick [name], male   DOB: 72 y.o.   MRN: [id number]   HPI Patient states he is improved still has mild discomfort but much better   ROS      Objective:  Physical Exam  Neurovascular status intact inflammation of the lateral third interspace with a positive Mulder sign shooting pain into the adjacent digits     Assessment:  Probability for neuroma symptomatology versus capsulitis     Plan:  H&P reviewed and at this point I discussed treatment options and the possibility for excision in the future.  We are going to hold off and try to treat conservatively all questions answered today patient to be seen back

## 2022-09-12 ENCOUNTER — Other Ambulatory Visit: Payer: Self-pay | Admitting: Family Medicine

## 2022-09-12 DIAGNOSIS — I10 Essential (primary) hypertension: Secondary | ICD-10-CM

## 2022-09-19 ENCOUNTER — Encounter: Payer: Self-pay | Admitting: Family Medicine

## 2022-09-19 ENCOUNTER — Telehealth (INDEPENDENT_AMBULATORY_CARE_PROVIDER_SITE_OTHER): Payer: Medicare Other | Admitting: Family Medicine

## 2022-09-19 ENCOUNTER — Telehealth: Payer: Self-pay | Admitting: Family Medicine

## 2022-09-19 DIAGNOSIS — U071 COVID-19: Secondary | ICD-10-CM

## 2022-09-19 MED ORDER — NIRMATRELVIR/RITONAVIR (PAXLOVID)TABLET
3.0000 | ORAL_TABLET | Freq: Two times a day (BID) | ORAL | 0 refills | Status: AC
Start: 2022-09-19 — End: 2022-09-24

## 2022-09-19 MED ORDER — MOLNUPIRAVIR EUA 200MG CAPSULE
4.00 | ORAL_CAPSULE | Freq: Two times a day (BID) | ORAL | 0 refills | Status: DC
Start: 2022-09-19 — End: 2022-09-19

## 2022-09-19 MED ORDER — PROMETHAZINE-DM 6.25-15 MG/5ML PO SYRP
5.0000 mL | ORAL_SOLUTION | Freq: Four times a day (QID) | ORAL | 0 refills | Status: AC | PRN
Start: 2022-09-19 — End: 2022-09-24

## 2022-09-19 MED ORDER — GUAIFENESIN ER 600 MG PO TB12
1200.0000 mg | ORAL_TABLET | Freq: Two times a day (BID) | ORAL | 0 refills | Status: AC
Start: 2022-09-19 — End: ?

## 2022-09-19 MED ORDER — PHENOL 1.4 % MT LIQD
1.0000 | OROMUCOSAL | 0 refills | Status: AC | PRN
Start: 2022-09-19 — End: ?

## 2022-09-19 NOTE — Telephone Encounter (Signed)
Pt says he is not on inhaler.

## 2022-09-19 NOTE — Telephone Encounter (Signed)
Patrick [name] with Patrick [name] called to follow up on the previous call regarding alternative medication. Advised Patrick [name] of what provider said.Patrick [name] said the cash price for the [employer]   medication is $1100. Pt is telling the pharmacy is not on an inhaler. Please advise

## 2022-09-19 NOTE — Telephone Encounter (Signed)
See below

## 2022-09-19 NOTE — Telephone Encounter (Signed)
Pt has a VV at 9:30 am with TB.

## 2022-09-19 NOTE — Telephone Encounter (Signed)
Let him know that I checked multiple sources and it is not recommend to take Paxlovid with the Advair (just stopping it temporarily while on Paxlovid is not advised either) - increased risk of a cardiovascular event. Thankfully he is stable and not having any difficulty breathing. He can see what the self-pay rate for the molnupiravir would be. Otherwise I would recommend just focusing on symptom management - taking his inhalers as prescribed and supportive measures we discussed.

## 2022-09-19 NOTE — Progress Notes (Signed)
Virtual Video Visit via MyChart Note  I connected with  Patrick [name] on 09/19/22 at  9:20 AM EDT by the video enabled telemedicine application for MyChart, and verified that I am speaking with the correct person using two identifiers.   I introduced myself as a Publishing rights manager with the practice. We discussed the limitations of evaluation and management by telemedicine and the availability of in person appointments. The patient expressed understanding and agreed to proceed.  Participating parties in this visit include: The patient and the nurse practitioner listed.  The patient is: At home I am: In the office - [facility]  Subjective:    CC:  Chief Complaint  Patient presents with   Covid Positive    covid + as of today sxs include: headache, sore throat, chills    HPI: Patrick [name] is a 72 y.o. year old male presenting today via MyChart today for COVID infection.  Patient reports he was recently visiting family over the weekend and a relative tested positive for COVID. His symptoms started yesterday and have included headache, sore throat, productive cough, rhinorrhea, temp 99.5 at max. He denies chest pain, dyspnea, wheezing. He has taken some tylenol for the headache, but no other treatment yet. He took a COVID test today and was positive. He does not recall ever having COVID before.     Past medical history, Surgical history, Family history not pertinant except as noted below, Social history, Allergies, and medications have been entered into the medical record, reviewed, and corrections made.   Review of Systems:  All review of systems negative except what is listed in the HPI   Objective:    General:  Speaking clearly in complete sentences. Absent shortness of breath noted.   Alert and oriented x3.   Normal judgment.  Absent acute distress.   Impression and Recommendations:    1. COVID-19 - molnupiravir EUA  (LAGEVRIO) 200 mg CAPS capsule; Take 4 capsules (800 mg total) by mouth 2 (two) times daily for 5 days.  Dispense: 40 capsule; Refill: 0 - promethazine-dextromethorphan (PROMETHAZINE-DM) 6.25-15 MG/5ML syrup; Take 5 mLs by mouth 4 (four) times daily as needed for up to 5 days for cough.  Dispense: 100 mL; Refill: 0 - phenol (CHLORASEPTIC) 1.4 % LIQD; Use as directed 1 spray in the mouth or throat as needed for throat irritation / pain.  Dispense: 177 mL; Refill: 0 - guaiFENesin (MUCINEX) 600 MG 12 hr tablet; Take 2 tablets (1,200 mg total) by mouth 2 (two) times daily.  Dispense: 30 tablet; Refill: 0  Paxlovid contraindicated due to Advair (salmeterol) use. Sending in molnupiravir, Mucinex, cough syrup, throat spray. Continue supportive measures including rest, hydration, humidifier use, steam showers, warm compresses to sinuses, warm liquids with lemon and honey, and over-the-counter cough, cold, and analgesics as needed. Additional information attached to AVS.  Patient aware of signs/symptoms requiring further/urgent evaluation.    Follow-up if symptoms worsen or fail to improve.    I discussed the assessment and treatment plan with the patient. The patient was provided an opportunity to ask questions and all were answered. The patient agreed with the plan and demonstrated an understanding of the instructions.   The patient was advised to call back or seek an in-person evaluation if the symptoms worsen or if the condition fails to improve as anticipated.   Clayborne Dana, NP

## 2022-09-19 NOTE — Telephone Encounter (Signed)
Duplicate

## 2022-09-19 NOTE — Addendum Note (Signed)
Addended by: Hyman Hopes B on: 09/19/2022 04:02 PM   Modules accepted: Orders

## 2022-09-19 NOTE — Telephone Encounter (Signed)
Patrick [name] confirmed he has not used Advair in over a month - previous thorough discussion of cardiovascular risks due to med interaction with Paxlovid. He is requesting prescription changed to Paxlovid given cost of molnupiravir since he is not using Advair.  Please advise him to also avoid sildenafil while on Paxlovid.

## 2022-09-19 NOTE — Telephone Encounter (Signed)
Pt aware and voices understanding.   

## 2022-09-19 NOTE — Telephone Encounter (Signed)
[facility] called to advise that his insurance does not cover molnupiravir EUA (LAGEVRIO) 200 mg CAPS capsule . They are requesting that Paxlovid be sent in instead to [facility] on [address]

## 2022-09-19 NOTE — Telephone Encounter (Signed)
Patient calls and states medication for covid that was sent in today pharmacy will not have it until late tomorrow and he does not feel well and can not wait. He would like to know if there is anything else that can be called in. Please advise

## 2022-09-19 NOTE — Patient Instructions (Signed)

## 2022-09-26 ENCOUNTER — Telehealth (HOSPITAL_BASED_OUTPATIENT_CLINIC_OR_DEPARTMENT_OTHER): Payer: Self-pay | Admitting: Pulmonary Disease

## 2022-09-26 NOTE — Telephone Encounter (Signed)
Pt. Asking if he need to have a X-ray before his up coming visit with Dr.

## 2022-09-26 NOTE — Telephone Encounter (Signed)
Patient checking on message states had Covid and would like to know if he needs xray before appointment. Patient phone number is [phone].

## 2022-09-26 NOTE — Telephone Encounter (Signed)
No Xray needed before appointment. Will evaluate at that time to determine testing. Mychart message sent. Will close encounter.

## 2022-09-29 MED ORDER — HYDROCOD POLI-CHLORPHE POLI ER 10-8 MG/5ML PO SUER
5.0000 mL | Freq: Two times a day (BID) | ORAL | 0 refills | Status: AC | PRN
Start: 2022-09-29 — End: 2022-10-04

## 2022-09-29 NOTE — Telephone Encounter (Signed)
Please advise 

## 2022-09-29 NOTE — Addendum Note (Signed)
Addended by: Hyman Hopes B on: 09/29/2022 09:48 AM   Modules accepted: Orders

## 2022-09-29 NOTE — Telephone Encounter (Signed)
Short supply of Tussionex provided. Advise that this could make him drowsy, use sparingly. If not improving, he needs to be seen since he is 10+ days out from initial infection.

## 2022-09-29 NOTE — Telephone Encounter (Signed)
Pt called to advise that he is coughing his head off and would like something stronger than what he was given to help. Please call pt to advise

## 2022-09-29 NOTE — Telephone Encounter (Signed)
Patient made aware.

## 2022-10-27 ENCOUNTER — Ambulatory Visit (INDEPENDENT_AMBULATORY_CARE_PROVIDER_SITE_OTHER): Payer: Medicare Other | Admitting: Pulmonary Disease

## 2022-10-27 ENCOUNTER — Encounter (HOSPITAL_BASED_OUTPATIENT_CLINIC_OR_DEPARTMENT_OTHER): Payer: Self-pay | Admitting: Pulmonary Disease

## 2022-10-27 VITALS — BP 118/78 | HR 82 | Resp 16 | Ht 72.0 in | Wt 188.4 lb

## 2022-10-27 DIAGNOSIS — R052 Subacute cough: Secondary | ICD-10-CM

## 2022-10-27 NOTE — Progress Notes (Signed)
Subjective:   PATIENT ID: Patrick [name] GENDER: male DOB: 19-Oct-1950, MRN: [id number]    Chief Complaint  Patient presents with   Follow-up    Doing pretty good. Had covid over 4th of July. No longer on CPAP    Reason for Visit: Follow-up  Patrick [name] is a 72 year old former smoker with GAD, moderate OSA and acid reflux who presents for follow-up.  Synopsis:  2020- Seen once for long standing dry hacking cough that occurs several times a day. Worse in the mornings. Denies wheezing. Triggered by cleaning solutions and wipes and strong scents. Can also be triggered when exposed to dust, grass, leaves. Also reports episodes of his throat closing up associated with sensations of feeling like he cannot breath. He has been taking singulair with some relief. He tried a rescue inhaler for a few weeks but did not notice a difference. On ROS, he reports a hx of reflux described as retrosternal burning. He is otherwise fairly active. He walks two miles daily. Only has shortness of breath with heavy exertion such as running.  2022- Returned for chronic cough in May.  09/29/20 Since our last visit he reports his cough has nearly resolved. He had previously tried prednisone, Breo and albuterol. He was restarted on Breo on the last visit but did not have any effect. His cough has self-resolved and no longer is an issue. His work-up for his cough included CT and PFT which we will discuss today. His wife is present as well. Denies significant shortness of breath or wheezing or any other respiratory issues. He does have concern about his lung due to his family history of ILD however unclear if this was work-related vs genetic.  09/28/21 Denies any current cough, shortness of breath or wheezing. Not on any bronchodilators.  10/27/22 Since our last visit he had covid in July 4th. He developed post-viral cough that has lasted 6 weeks but 95% improved. Only bothers him in the evening.  Reports persistent nasal congestion. Denies shortness of breath or wheezing. Does not relate to environmental factors. Has tried Breo and Advair Diskus in the past without relief. Improves with codeine cough syrup.  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 Previously smoked 2 ppd Father had ?ILD after working at [employer]. Exposure to chemicals, powders   Environmental exposures:  Prior home reno projects including insulation and flooring. No clear exposure to asbestos Intermittent projects with Engineer, structural with metals  Past Medical History:  Diagnosis Date   Anxiety    Arthritis    Basal cell carcinoma    BPH (benign prostatic hyperplasia)    Depression    Dysphagia    Emphysema of lung (HCC)    Fibromyalgia    GERD (gastroesophageal reflux disease)    otc  tums   Headache    migraines   Herpes simplex type 1 infection    History of migraine    Hypertension    no longer on medication   Insomnia    Lower back pain    Morton's neuroma    OSA (obstructive sleep apnea)    Primary snoring    Recurrent canker sores    Sleep apnea    Urinary frequency     No Known Allergies   Outpatient Medications Prior to Visit  Medication Sig Dispense Refill   benzonatate (TESSALON) 200 MG capsule Take 1 capsule (200 mg total) by mouth  3 (three) times daily as needed for cough. 60 capsule 3   clonazePAM (KLONOPIN) 0.5 MG tablet TAKE ONE TO TWO TABLETS BY MOUTH EVERY NIGHT AT BEDTIME AS NEEDED FOR SLEEP 180 tablet 1   diclofenac Sodium (VOLTAREN ARTHRITIS PAIN) 1 % GEL Apply 2 gm to upper extremity joints and 4 gm to lower extremity joints up to 4x daily.  Max 32g total per day 300 g 3   famotidine (PEPCID) 20 MG tablet Take 1 tablet (20 mg total) by mouth 2 (two) times daily as needed for heartburn. 180 tablet 1   fluticasone (FLONASE) 50 MCG/ACT nasal spray Place 2 sprays into both nostrils  daily. 43 g 3   fluticasone-salmeterol (ADVAIR) 100-50 MCG/ACT AEPB Inhale 1 puff into the lungs 2 (two) times daily. **Rinse mouth after each use** 60 each 3   guaiFENesin (MUCINEX) 600 MG 12 hr tablet Take 2 tablets (1,200 mg total) by mouth 2 (two) times daily. 30 tablet 0   HYDROcodone-acetaminophen (NORCO/VICODIN) 5-325 MG tablet Take 1 tablet by mouth every 8 (eight) hours as needed for moderate pain. 30 tablet 0   indomethacin (INDOCIN) 25 MG capsule Take 1 to 2 capsules three times daily as needed for headache. 20 capsule 0   ipratropium (ATROVENT) 0.03 % nasal spray Place 2 sprays into both nostrils every 12 (twelve) hours. 30 mL 12   losartan (COZAAR) 50 MG tablet Take 1 tablet (50 mg total) by mouth daily. 90 tablet 0   Melatonin 5 MG TABS Take 5 mg by mouth at bedtime.     meloxicam (MOBIC) 15 MG tablet Take 1 tablet (15 mg total) by mouth daily. 90 tablet 3   methylphenidate (RITALIN) 20 MG tablet Take 1 tablet (20 mg total) by mouth 2 (two) times daily. Use as needed for chronic fatigue 30 tablet 0   montelukast (SINGULAIR) 10 MG tablet Take 1 tablet (10 mg total) by mouth at bedtime. 90 tablet 3   pantoprazole (PROTONIX) 40 MG tablet Take 1 tablet (40 mg total) by mouth daily. 90 tablet 1   phenol (CHLORASEPTIC) 1.4 % LIQD Use as directed 1 spray in the mouth or throat as needed for throat irritation / pain. 177 mL 0   pregabalin (LYRICA) 100 MG capsule TAKE TWO CAPSULES BY MOUTH DAILY AT BEDTIME 180 capsule 1   rosuvastatin (CRESTOR) 10 MG tablet Take 1 tablet (10 mg total) by mouth daily. 90 tablet 3   sildenafil (VIAGRA) 100 MG tablet TAKE ONE-HALF TO ONE TABLET BY MOUTH DAILY AS NEEDED 6 tablet 11   sucralfate (CARAFATE) 1 g tablet Take 1 tablet (1 g total) by mouth 4 (four) times daily -  with meals and at bedtime. 40 tablet 0   SUMAtriptan (IMITREX) 100 MG tablet Take 1 tablet earliest onset of migraine.  May repeat in 2 hours if headache persists or recurs.  Maximum 2 tablets  in 24 hours. 10 tablet 5   topiramate (TOPAMAX) 100 MG tablet Take 1 tablet (100 mg total) by mouth daily. 90 tablet 3   traZODone (DESYREL) 150 MG tablet TAKE ONE TABLET BY MOUTH EVERY NIGHT AT BEDTIME AS NEEDED FOR SLEEP 90 tablet 3   vitamin B-12 (CYANOCOBALAMIN) 500 MCG tablet Take 500 mcg by mouth daily.     metoprolol tartrate (LOPRESSOR) 100 MG tablet Take 1 tablet (100 mg total) by mouth once for 1 dose. Take two hours prior to your cardiac CT 1 tablet 0   No facility-administered medications prior to visit.  Review of Systems  Constitutional:  Negative for chills, diaphoresis, fever, malaise/fatigue and weight loss.  HENT:  Positive for congestion.   Respiratory:  Positive for cough. Negative for hemoptysis, sputum production, shortness of breath and wheezing.   Cardiovascular:  Negative for chest pain, palpitations and leg swelling.     Objective:   Vitals:   10/27/22 0838  BP: 118/78  Pulse: 82  Resp: 16  SpO2: 97%  Weight: 188 lb 6.4 oz (85.5 kg)  Height: 6' (1.829 m)   SpO2: 97 %  Physical Exam: General: Well-appearing, no acute distress HENT: [place], AT Eyes: EOMI, no scleral icterus Respiratory: Clear to auscultation bilaterally.  No crackles, wheezing or rales Cardiovascular: RRR, -M/R/G, no JVD Extremities:-Edema,-tenderness Neuro: AAO x4, CNII-XII grossly intact Psych: Normal mood, normal affect   Data Reviewed:  Imaging: CT Chest Lung Screen 10/15/18 - Centrilobular emphysema, RLL calcified granuloma. Subpleural left base nodule, unchanged CT Chest Lung Screen 12/19/19 - Centrilobular emphysema. Unchanged subpleural left base nodule. RLL calcified granuloma CT Coronary - Lung parenchyma with minimal RML and lingula scarring and bilateral atelectasis. Enlarged pulmonary trunk. CT 09/24/20 - Mild patchy ground glass and septal thickening in the lung bases with LLL scarring. No honeycombing or traction bronchiectasis. 44 subpleural nodule in left lower lobe.   CT Chest 09/28/21 - Normal parenchyma. No significant GGO, septal thickening, subpleural reticulation, traction bronchiectasis or honeycombing present. Minimal scattered scarring. CXR 07/06/22 - No infiltrate effusion or edema  PFT: 09/29/20 FVC 4.67 (95%) FEV1 3.91 (107%) Ratio 82  TLC 89% DLCO 127% Interpretation: Normal spirometry  Labs: CBC    Component Value Date/Time   WBC 5.0 07/06/2022 1543   RBC 4.64 07/06/2022 1543   HGB 14.9 07/06/2022 1543   HGB 15.2 04/02/2021 0836   HCT 43.2 07/06/2022 1543   PLT 146.0 (L) 07/06/2022 1543   PLT 128 (L) 04/02/2021 0836   MCV 92.9 07/06/2022 1543   MCH 31.6 11/14/2021 2021   MCHC 34.5 07/06/2022 1543   RDW 13.1 07/06/2022 1543   LYMPHSABS 1.2 11/14/2021 2021   MONOABS 0.4 11/14/2021 2021   EOSABS 0.2 11/14/2021 2021   BASOSABS 0.0 11/14/2021 2021   BMET    Component Value Date/Time   NA 141 07/06/2022 1543   NA 143 07/03/2020 0827   K 4.1 07/06/2022 1543   CL 107 07/06/2022 1543   CO2 29 07/06/2022 1543   GLUCOSE 89 07/06/2022 1543   BUN 16 07/06/2022 1543   BUN 16 07/03/2020 0827   CREATININE 1.10 07/06/2022 1543   CREATININE 1.12 04/02/2021 0836   CALCIUM 9.4 07/06/2022 1543   GFRNONAA >60 11/14/2021 2021   GFRNONAA >60 04/02/2021 0836   GFRAA >60 09/26/2015 1505   Absolute eos 100  Sleep study: 08/17/14 Split night - AHI 15.8 08/28/14 CPAP titration - Recommend 10cm H20  Echocardiogram EF 55-60%, no WMA or valvular abnormalities    Assessment & Plan:   Discussion: 72 year old male former smoker with moderate OSA, GAD and reflux who presents for follow-up. Initially presented for chronic cough with negative work-up. However he returned in 2022 with cough and questionable CT imaging concerning for ILD. Repeat imaging in 09/2021 reviewed and no evidence of ILD with overall normal parenchyma. In setting of prior normal PFTs, his prior findings were likely infectious/inflammatory in setting of preceding illness at that  time.  He presents today with prolonged post-viral cough due to covid. Has concerns that this may has caused similar scarring to his lungs in  the past. Cough is improving in < 8 weeks. No inhalers indicated. OK to use codeine cough syrup as needed but cautioned this is not a long term management solution. Will evaluate with PFTs in the future and if abnormal will consider CT imaging.  Recent covid infection Post-infectious cough - improving --ORDER pulmonary function tests in 6 months   Health Maintenance Immunization History  Administered Date(s) Administered   Fluad Quad(high Dose 65+) 12/18/2019, 12/07/2020, 12/20/2021   Influenza, High Dose Seasonal PF 12/03/2018   Influenza,inj,Quad PF,6+ Mos 12/15/2016   Influenza-Unspecified 01/12/2018   Moderna SARS-COV2 Booster Vaccination 06/22/2020   Moderna Sars-Covid-2 Vaccination 04/15/2019, 05/12/2019   Pfizer Covid-19 Vaccine Bivalent Booster 38yrs & up 12/07/2020   Pneumococcal Conjugate-13 05/03/2018   Pneumococcal Polysaccharide-23 07/31/2019   Tdap 07/20/2016   Zoster Recombinant(Shingrix) 07/28/2016, 10/28/2016   Zoster, Live 03/14/2013   CT Lung Screen - not qualified. >15 since quit smoking  Orders Placed This Encounter  Procedures   Pulmonary function test    Standing Status:   Future    Standing Expiration Date:   10/27/2023    Order Specific Question:   Where should this test be performed?    Answer:   [facility]    Order Specific Question:   Full PFT: includes the following: basic spirometry, spirometry pre & post bronchodilator, diffusion capacity (DLCO), lung volumes    Answer:   Full PFT   No orders of the defined types were placed in this encounter.   Return in about 6 months (around 04/29/2023) for after PFT.   I have spent a total time of 30-minutes on the day of the appointment including chart review, data review, collecting history, coordinating care and discussing medical diagnosis and plan with the  patient/family. Past medical history, allergies, medications were reviewed. Pertinent imaging, labs and tests included in this note have been reviewed and interpreted independently by me.   Mechele Collin, MD [facility] 10/27/2022 9:27 AM  Office Number [phone]

## 2022-10-27 NOTE — Patient Instructions (Signed)
Post-infectious cough - improving --ORDER pulmonary function tests in 6 months

## 2022-11-05 ENCOUNTER — Other Ambulatory Visit: Payer: Self-pay | Admitting: Family Medicine

## 2022-11-05 DIAGNOSIS — G894 Chronic pain syndrome: Secondary | ICD-10-CM

## 2022-11-15 NOTE — Progress Notes (Unsigned)
[facility] [address], [address] [place], [place] [address] [phone]  Date:  11/21/2022   Name:  Patrick [name]   DOB:  [birth year]   MRN:  [id number]  PCP:  Pearline Cables, MD    Chief Complaint: No chief complaint on file.   History of Present Illness:  Patrick [name] is a 72 y.o. very pleasant male patient who presents with the following:  Pt seen today with concern of lack of energy Last seen by myself in April -  history of sleep apnea, HTN, headache, GERD, BPH He uses hydrocodone occasionally for various musculoskeletal pains Also using clonazepam  He is seen by GI, Dr Myrtie Neither for GERD sx   Flu and covid needed this fall Most recent labs in April -CMP CBC only  Lab Results  Component Value Date   TSH 2.62 07/15/2020   Lab Results  Component Value Date   HGBA1C 5.3 03/25/2021   Lab Results  Component Value Date   PSA 0.38 12/20/2021   PSA 0.36 03/25/2021   PSA 0.35 12/18/2019      Patient Active Problem List   Diagnosis Date Noted   Abnormal CT scan of lung 10/29/2020   Chronic bronchitis (HCC) 10/29/2020   Urinary frequency    Recurrent canker sores    Primary snoring    OSA (obstructive sleep apnea)    Morton's neuroma    Lower back pain    Insomnia    Hypertension    History of migraine    Herpes simplex type 1 infection    GERD (gastroesophageal reflux disease)    Headache    Fibromyalgia    Emphysema of lung (HCC)    Dysphagia    Depression    BPH (benign prostatic hyperplasia)    Basal cell carcinoma    Arthritis    Anxiety    Dizziness 11/29/2019   Acquired mallet deformity of finger of right hand 11/25/2019   Chronic cough 02/09/2019   Tear of right rotator cuff 12/13/2017   Right rotator cuff tendinitis 01/10/2017   Hearing loss 12/15/2016   Chronic fatigue 10/09/2015   Generalized anxiety disorder 10/09/2015   Memory loss 10/09/2015   Chronic SI joint  pain 05/25/2015   Recurrent major depressive disorder, in partial remission (HCC) 04/30/2015   Obstructive sleep apnea of adult 02/09/2015   Lumbar facet joint pain 04/09/2014   Benign prostatic hyperplasia with urinary obstruction 11/18/2013   Acid reflux 11/18/2013   Degenerative arthritis of lumbar spine 11/18/2013   Headache, migraine 11/18/2013   Anal pain 11/18/2013   Atopic dermatitis 11/18/2013   Bladder neck contracture 11/18/2013   Constipation 11/18/2013   Crush injury to finger 11/18/2013   Deviated nasal septum 11/18/2013   Empty sella syndrome (HCC) 11/18/2013   Abdominal pain 11/18/2013   Hematoma, subungual, third finger, left 11/18/2013   Hypertrophy of nasal turbinates 11/18/2013   Laceration of third finger, left 11/18/2013   Lateral epicondylitis 11/18/2013   Lymph nodes enlarged 11/18/2013   Nonallopathic lesion of sacral region 11/18/2013   OAB (overactive bladder) 11/18/2013   Open fracture of distal phalanx of third finger of left hand 11/18/2013   Other intervertebral disc degeneration, lumbar region 11/18/2013   External hemorrhoids with other complication 12/20/2011   Restless leg 05/27/2009    Past Medical History:  Diagnosis Date   Anxiety    Arthritis    Basal cell carcinoma  BPH (benign prostatic hyperplasia)    Depression    Dysphagia    Emphysema of lung (HCC)    Fibromyalgia    GERD (gastroesophageal reflux disease)    otc  tums   Headache    migraines   Herpes simplex type 1 infection    History of migraine    Hypertension    no longer on medication   Insomnia    Lower back pain    Morton's neuroma    OSA (obstructive sleep apnea)    Primary snoring    Recurrent canker sores    Sleep apnea    Urinary frequency     Past Surgical History:  Procedure Laterality Date   EPIDIDYMECTOMY     for spermatocele   EYE SURGERY Bilateral    lasik eye surgery   FOOT SURGERY Left    INGUINAL HERNIA REPAIR Left    01/25/1999    MOUTH SURGERY     pancreatic surgery r/t trauma     SEPTOPLASTY N/A 02/09/2015   Procedure: SEPTOPLASTY;  Surgeon: Drema Halon, MD;  Location: [facility] OR;  Service: ENT;  Laterality: N/A;   SKIN CANCER EXCISION     TONSILLECTOMY     TRANSURETHRAL INCISION OF PROSTATE     TURBINATE REDUCTION Bilateral 02/09/2015   Procedure: BILATERAL TURBINATE REDUCTION;  Surgeon: Drema Halon, MD;  Location: [facility] OR;  Service: ENT;  Laterality: Bilateral;   UVULOPALATOPHARYNGOPLASTY N/A 02/09/2015   Procedure: UVULOPALATOPHARYNGOPLASTY (UPPP);  Surgeon: Drema Halon, MD;  Location: [facility] OR;  Service: ENT;  Laterality: N/A;    Social History   Tobacco Use   Smoking status: Former    Current packs/day: 0.00    Average packs/day: 2.0 packs/day for 38.6 years (77.3 ttl pk-yrs)    Types: Cigarettes    Start date: 03/1967    Quit date: 10/29/2005    Years since quitting: 17.0   Smokeless tobacco: Never  Vaping Use   Vaping status: Never Used  Substance Use Topics   Alcohol use: Yes    Alcohol/week: 5.0 standard drinks of alcohol    Types: 2 Glasses of wine, 3 Cans of beer per week    Comment: 1 beer every other day   Drug use: No    Family History  Problem Relation Age of Onset   Coronary artery disease Mother    Stroke Mother    Hypertension Father    Anxiety disorder Brother    Skin cancer Brother    Stroke Brother    Colon cancer Neg Hx    Esophageal cancer Neg Hx    Stomach cancer Neg Hx     No Known Allergies  Medication list has been reviewed and updated.  Current Outpatient Medications on File Prior to Visit  Medication Sig Dispense Refill   benzonatate (TESSALON) 200 MG capsule Take 1 capsule (200 mg total) by mouth 3 (three) times daily as needed for cough. 60 capsule 3   clonazePAM (KLONOPIN) 0.5 MG tablet TAKE ONE TO TWO TABLETS BY MOUTH EVERY NIGHT AT BEDTIME AS NEEDED FOR SLEEP 180 tablet 1   diclofenac Sodium (VOLTAREN ARTHRITIS PAIN) 1 % GEL Apply 2 gm  to upper extremity joints and 4 gm to lower extremity joints up to 4x daily.  Max 32g total per day 300 g 3   famotidine (PEPCID) 20 MG tablet Take 1 tablet (20 mg total) by mouth 2 (two) times daily as needed for heartburn. 180 tablet 1   fluticasone (FLONASE)  50 MCG/ACT nasal spray Place 2 sprays into both nostrils daily. 43 g 3   fluticasone-salmeterol (ADVAIR) 100-50 MCG/ACT AEPB Inhale 1 puff into the lungs 2 (two) times daily. **Rinse mouth after each use** 60 each 3   guaiFENesin (MUCINEX) 600 MG 12 hr tablet Take 2 tablets (1,200 mg total) by mouth 2 (two) times daily. 30 tablet 0   HYDROcodone-acetaminophen (NORCO/VICODIN) 5-325 MG tablet Take 1 tablet by mouth every 8 (eight) hours as needed for moderate pain. 30 tablet 0   indomethacin (INDOCIN) 25 MG capsule Take 1 to 2 capsules three times daily as needed for headache. 20 capsule 0   ipratropium (ATROVENT) 0.03 % nasal spray Place 2 sprays into both nostrils every 12 (twelve) hours. 30 mL 12   losartan (COZAAR) 50 MG tablet Take 1 tablet (50 mg total) by mouth daily. 90 tablet 0   Melatonin 5 MG TABS Take 5 mg by mouth at bedtime.     meloxicam (MOBIC) 15 MG tablet TAKE 1 TABLET BY MOUTH DAILY 90 tablet 3   methylphenidate (RITALIN) 20 MG tablet Take 1 tablet (20 mg total) by mouth 2 (two) times daily. Use as needed for chronic fatigue 30 tablet 0   metoprolol tartrate (LOPRESSOR) 100 MG tablet Take 1 tablet (100 mg total) by mouth once for 1 dose. Take two hours prior to your cardiac CT 1 tablet 0   montelukast (SINGULAIR) 10 MG tablet Take 1 tablet (10 mg total) by mouth at bedtime. 90 tablet 3   pantoprazole (PROTONIX) 40 MG tablet Take 1 tablet (40 mg total) by mouth daily. 90 tablet 1   phenol (CHLORASEPTIC) 1.4 % LIQD Use as directed 1 spray in the mouth or throat as needed for throat irritation / pain. 177 mL 0   pregabalin (LYRICA) 100 MG capsule TAKE TWO CAPSULES BY MOUTH DAILY AT BEDTIME 180 capsule 1   rosuvastatin (CRESTOR)  10 MG tablet Take 1 tablet (10 mg total) by mouth daily. 90 tablet 3   sildenafil (VIAGRA) 100 MG tablet TAKE ONE-HALF TO ONE TABLET BY MOUTH DAILY AS NEEDED 6 tablet 11   sucralfate (CARAFATE) 1 g tablet Take 1 tablet (1 g total) by mouth 4 (four) times daily -  with meals and at bedtime. 40 tablet 0   SUMAtriptan (IMITREX) 100 MG tablet Take 1 tablet earliest onset of migraine.  May repeat in 2 hours if headache persists or recurs.  Maximum 2 tablets in 24 hours. 10 tablet 5   topiramate (TOPAMAX) 100 MG tablet Take 1 tablet (100 mg total) by mouth daily. 90 tablet 3   traZODone (DESYREL) 150 MG tablet TAKE ONE TABLET BY MOUTH EVERY NIGHT AT BEDTIME AS NEEDED FOR SLEEP 90 tablet 3   vitamin B-12 (CYANOCOBALAMIN) 500 MCG tablet Take 500 mcg by mouth daily.     No current facility-administered medications on file prior to visit.    Review of Systems:  As per HPI- otherwise negative.   Physical Examination: There were no vitals filed for this visit. There were no vitals filed for this visit. There is no height or weight on file to calculate BMI. Ideal Body Weight:    GEN: no acute distress. HEENT: Atraumatic, Normocephalic.  Ears and Nose: No external deformity. CV: RRR, No M/G/R. No JVD. No thrill. No extra heart sounds. PULM: CTA B, no wheezes, crackles, rhonchi. No retractions. No resp. distress. No accessory muscle use. ABD: S, NT, ND, +BS. No rebound. No HSM. EXTR: No c/c/e PSYCH:  Normally interactive. Conversant.    Assessment and Plan: ***  Signed Abbe Amsterdam, MD

## 2022-11-15 NOTE — Patient Instructions (Addendum)
Good to see you again- recommend covid booster this fall  I will be in touch with your labs asap; if we don't find an explanation for your fatigue there I would suggest doing a sleep study as the next step   Recommend that you see ortho about your knee pain!   Let me know if the rib pain is not improving as expected over the next few weeks

## 2022-11-21 ENCOUNTER — Ambulatory Visit (INDEPENDENT_AMBULATORY_CARE_PROVIDER_SITE_OTHER): Payer: Medicare Other | Admitting: Family Medicine

## 2022-11-21 ENCOUNTER — Encounter: Payer: Self-pay | Admitting: Family Medicine

## 2022-11-21 VITALS — BP 122/60 | HR 66 | Temp 97.6°F | Resp 18 | Ht 72.0 in | Wt 193.0 lb

## 2022-11-21 DIAGNOSIS — E782 Mixed hyperlipidemia: Secondary | ICD-10-CM | POA: Diagnosis not present

## 2022-11-21 DIAGNOSIS — Z8639 Personal history of other endocrine, nutritional and metabolic disease: Secondary | ICD-10-CM | POA: Diagnosis not present

## 2022-11-21 DIAGNOSIS — R5383 Other fatigue: Secondary | ICD-10-CM

## 2022-11-21 DIAGNOSIS — I10 Essential (primary) hypertension: Secondary | ICD-10-CM

## 2022-11-21 DIAGNOSIS — Z23 Encounter for immunization: Secondary | ICD-10-CM

## 2022-11-21 DIAGNOSIS — E559 Vitamin D deficiency, unspecified: Secondary | ICD-10-CM

## 2022-11-21 DIAGNOSIS — M47816 Spondylosis without myelopathy or radiculopathy, lumbar region: Secondary | ICD-10-CM

## 2022-11-21 DIAGNOSIS — Z131 Encounter for screening for diabetes mellitus: Secondary | ICD-10-CM | POA: Diagnosis not present

## 2022-11-21 DIAGNOSIS — R109 Unspecified abdominal pain: Secondary | ICD-10-CM

## 2022-11-21 DIAGNOSIS — R5382 Chronic fatigue, unspecified: Secondary | ICD-10-CM

## 2022-11-21 DIAGNOSIS — M5430 Sciatica, unspecified side: Secondary | ICD-10-CM

## 2022-11-21 DIAGNOSIS — Z125 Encounter for screening for malignant neoplasm of prostate: Secondary | ICD-10-CM

## 2022-11-21 LAB — COMPREHENSIVE METABOLIC PANEL
ALT: 13 U/L (ref 0–53)
AST: 15 U/L (ref 0–37)
Albumin: 4.1 g/dL (ref 3.5–5.2)
Alkaline Phosphatase: 64 U/L (ref 39–117)
BUN: 26 mg/dL — ABNORMAL HIGH (ref 6–23)
CO2: 29 meq/L (ref 19–32)
Calcium: 9.3 mg/dL (ref 8.4–10.5)
Chloride: 108 meq/L (ref 96–112)
Creatinine, Ser: 1.11 mg/dL (ref 0.40–1.50)
GFR: 67.48 mL/min (ref >60.00)
Glucose, Bld: 83 mg/dL (ref 70–99)
Potassium: 4.4 meq/L (ref 3.5–5.1)
Sodium: 143 meq/L (ref 135–145)
Total Bilirubin: 0.5 mg/dL (ref 0.2–1.2)
Total Protein: 6.5 g/dL (ref 6.0–8.3)

## 2022-11-21 LAB — LIPID PANEL
Cholesterol: 101 mg/dL (ref 0–200)
HDL: 44.1 mg/dL (ref >39.00)
LDL Cholesterol: 35 mg/dL (ref 0–99)
NonHDL: 56.68
Total CHOL/HDL Ratio: 2
Triglycerides: 108 mg/dL (ref 0.0–149.0)
VLDL: 21.6 mg/dL (ref 0.0–40.0)

## 2022-11-21 LAB — CBC
HCT: 43.4 % (ref 39.0–52.0)
Hemoglobin: 14.3 g/dL (ref 13.0–17.0)
MCHC: 32.9 g/dL (ref 30.0–36.0)
MCV: 94.8 fl (ref 78.0–100.0)
Platelets: 122 10*3/uL — ABNORMAL LOW (ref 150.0–400.0)
RBC: 4.57 Mil/uL (ref 4.22–5.81)
RDW: 13.8 % (ref 11.5–15.5)
WBC: 3.9 10*3/uL — ABNORMAL LOW (ref 4.0–10.5)

## 2022-11-21 LAB — VITAMIN D 25 HYDROXY (VIT D DEFICIENCY, FRACTURES): VITD: 37.96 ng/mL (ref 30.00–100.00)

## 2022-11-21 LAB — FERRITIN: Ferritin: 108.1 ng/mL (ref 22.0–322.0)

## 2022-11-21 LAB — VITAMIN B12: Vitamin B-12: 118 pg/mL — ABNORMAL LOW (ref 211–911)

## 2022-11-21 LAB — PSA, MEDICARE: PSA: 0.31 ng/mL (ref 0.10–4.00)

## 2022-11-21 LAB — LIPASE: Lipase: 81 U/L — ABNORMAL HIGH (ref 11.0–59.0)

## 2022-11-21 LAB — TSH: TSH: 2.09 u[IU]/mL (ref 0.35–5.50)

## 2022-11-21 LAB — HEMOGLOBIN A1C: Hgb A1c MFr Bld: 5.3 % (ref 4.6–6.5)

## 2022-11-21 MED ORDER — HYDROCODONE-ACETAMINOPHEN 5-325 MG PO TABS: 1.0000 | ORAL_TABLET | Freq: Three times a day (TID) | ORAL | 0 refills | Status: DC | PRN

## 2022-11-21 MED ORDER — METHYLPHENIDATE HCL 20 MG PO TABS
20.0000 mg | ORAL_TABLET | Freq: Two times a day (BID) | ORAL | 0 refills | Status: DC
Start: 2022-11-21 — End: 2023-05-31

## 2022-11-22 ENCOUNTER — Other Ambulatory Visit: Payer: Self-pay | Admitting: Family Medicine

## 2022-11-22 ENCOUNTER — Encounter: Payer: Self-pay | Admitting: Family Medicine

## 2022-11-22 DIAGNOSIS — G4730 Sleep apnea, unspecified: Secondary | ICD-10-CM

## 2022-11-22 DIAGNOSIS — R748 Abnormal levels of other serum enzymes: Secondary | ICD-10-CM

## 2022-11-22 LAB — TESTOSTERONE TOTAL,FREE,BIO, MALES
Albumin: 4.2 g/dL (ref 3.6–5.1)
Sex Hormone Binding: 63 nmol/L (ref 22–77)
Testosterone, Bioavailable: 82.4 ng/dL (ref 15.0–150.0)
Testosterone, Free: 42.8 pg/mL (ref 6.0–73.0)
Testosterone: 552 ng/dL (ref 250–827)

## 2022-11-28 ENCOUNTER — Ambulatory Visit (INDEPENDENT_AMBULATORY_CARE_PROVIDER_SITE_OTHER): Payer: Medicare Other | Admitting: Podiatry

## 2022-11-28 ENCOUNTER — Encounter: Payer: Self-pay | Admitting: Podiatry

## 2022-11-28 DIAGNOSIS — M7752 Other enthesopathy of left foot: Secondary | ICD-10-CM

## 2022-11-28 DIAGNOSIS — G5761 Lesion of plantar nerve, right lower limb: Secondary | ICD-10-CM | POA: Diagnosis not present

## 2022-11-28 MED ORDER — TRIAMCINOLONE ACETONIDE 10 MG/ML IJ SUSP
10.0000 mg | Freq: Once | INTRAMUSCULAR | Status: AC
Start: 2022-11-28 — End: 2022-11-28
  Administered 2022-11-28: 10 mg via INTRA_ARTICULAR

## 2022-11-29 NOTE — Progress Notes (Signed)
Subjective:   Patient ID: Patrick [name], male   DOB: 72 y.o.   MRN: [id number]   HPI Patient presents stating he has had a lot of pain in his left big toe and is concerned about the condition after it was stepped on by his wife.  Also continues to complain about nerve irritation between the third and fourth toes right stating the only got temporary relief   ROS      Objective:  Physical Exam  Neurovascular status intact with patient having 2 different defined problems with 1 being nerve probable neuroma symptomatology third interspace right foot and #2 inflammation pain of the inner phalangeal joint left after having significant trauma to the joint itself with pain     Assessment:  Chronic neuroma symptomatology right that did not respond well to injection treatment along with inflammatory capsulitis of the phalangeal joint left big toe     Plan:  H&P reviewed both conditions.  For the right it will take surgical intervention at 1 point patient wants to do this but needs to look at his timing and I did educate him on the condition and what will be required.  Patient understands surgical recovery and will call to schedule.  For the left I did do a careful inner phalangeal joint injection 3 mg Dexasone Kenalog 5 mg Xylocaine tolerated well

## 2022-12-02 ENCOUNTER — Ambulatory Visit: Payer: Medicare Other | Admitting: Podiatry

## 2022-12-05 ENCOUNTER — Other Ambulatory Visit: Payer: Self-pay | Admitting: Family Medicine

## 2022-12-05 DIAGNOSIS — F411 Generalized anxiety disorder: Secondary | ICD-10-CM

## 2022-12-05 DIAGNOSIS — I10 Essential (primary) hypertension: Secondary | ICD-10-CM

## 2022-12-29 ENCOUNTER — Other Ambulatory Visit: Payer: Self-pay | Admitting: Family Medicine

## 2023-02-03 ENCOUNTER — Other Ambulatory Visit: Payer: Self-pay | Admitting: Family Medicine

## 2023-02-03 DIAGNOSIS — K219 Gastro-esophageal reflux disease without esophagitis: Secondary | ICD-10-CM

## 2023-02-03 DIAGNOSIS — F5101 Primary insomnia: Secondary | ICD-10-CM

## 2023-02-26 ENCOUNTER — Other Ambulatory Visit: Payer: Self-pay | Admitting: Family Medicine

## 2023-02-26 DIAGNOSIS — G894 Chronic pain syndrome: Secondary | ICD-10-CM

## 2023-02-26 DIAGNOSIS — F411 Generalized anxiety disorder: Secondary | ICD-10-CM

## 2023-02-27 NOTE — Telephone Encounter (Signed)
Requesting: Klonopin 0.5 mh and Lyrica 100 mg Contract: N/A UDS: 05/16/2022 Last Visit: 11/21/2022 Next Visit: N/A Last Refill: Klonopin 12/05/2022 and Lyrica 05/16/2022  Please Advise

## 2023-05-01 ENCOUNTER — Ambulatory Visit (HOSPITAL_BASED_OUTPATIENT_CLINIC_OR_DEPARTMENT_OTHER): Payer: Medicare Other | Admitting: Pulmonary Disease

## 2023-05-01 ENCOUNTER — Encounter (HOSPITAL_BASED_OUTPATIENT_CLINIC_OR_DEPARTMENT_OTHER): Payer: Medicare Other

## 2023-05-01 ENCOUNTER — Encounter (HOSPITAL_BASED_OUTPATIENT_CLINIC_OR_DEPARTMENT_OTHER): Payer: Self-pay

## 2023-05-02 ENCOUNTER — Ambulatory Visit (INDEPENDENT_AMBULATORY_CARE_PROVIDER_SITE_OTHER): Payer: Medicare Other | Admitting: Pulmonary Disease

## 2023-05-02 DIAGNOSIS — R052 Subacute cough: Secondary | ICD-10-CM | POA: Diagnosis not present

## 2023-05-02 LAB — PULMONARY FUNCTION TEST
DL/VA % pred: 113 %
DL/VA: 4.55 ml/min/mmHg/L
DLCO cor % pred: 106 %
DLCO cor: 29.2 ml/min/mmHg
DLCO unc % pred: 106 %
DLCO unc: 29.2 ml/min/mmHg
FEF 25-75 Post: 4.5 L/s
FEF 25-75 Pre: 4.12 L/s
FEF2575-%Change-Post: 9 %
FEF2575-%Pred-Post: 168 %
FEF2575-%Pred-Pre: 154 %
FEV1-%Change-Post: 2 %
FEV1-%Pred-Post: 105 %
FEV1-%Pred-Pre: 103 %
FEV1-Post: 3.7 L
FEV1-Pre: 3.62 L
FEV1FVC-%Change-Post: 1 %
FEV1FVC-%Pred-Pre: 111 %
FEV6-%Change-Post: 0 %
FEV6-%Pred-Post: 98 %
FEV6-%Pred-Pre: 98 %
FEV6-Post: 4.45 L
FEV6-Pre: 4.43 L
FEV6FVC-%Pred-Post: 105 %
FEV6FVC-%Pred-Pre: 105 %
FVC-%Change-Post: 0 %
FVC-%Pred-Post: 93 %
FVC-%Pred-Pre: 92 %
FVC-Post: 4.45 L
FVC-Pre: 4.43 L
Post FEV1/FVC ratio: 83 %
Post FEV6/FVC ratio: 100 %
Pre FEV1/FVC ratio: 82 %
Pre FEV6/FVC Ratio: 100 %
RV % pred: 98 %
RV: 2.51 L
TLC % pred: 95 %
TLC: 7.1 L

## 2023-05-02 NOTE — Patient Instructions (Signed)
 Full PFT performed today.

## 2023-05-02 NOTE — Progress Notes (Signed)
 Full PFT performed today.

## 2023-05-05 ENCOUNTER — Telehealth (HOSPITAL_BASED_OUTPATIENT_CLINIC_OR_DEPARTMENT_OTHER): Payer: Medicare Other | Admitting: Pulmonary Disease

## 2023-05-05 ENCOUNTER — Encounter (HOSPITAL_BASED_OUTPATIENT_CLINIC_OR_DEPARTMENT_OTHER): Payer: Self-pay | Admitting: Pulmonary Disease

## 2023-05-05 ENCOUNTER — Telehealth (HOSPITAL_BASED_OUTPATIENT_CLINIC_OR_DEPARTMENT_OTHER): Payer: Self-pay | Admitting: Pulmonary Disease

## 2023-05-05 DIAGNOSIS — R059 Cough, unspecified: Secondary | ICD-10-CM | POA: Diagnosis not present

## 2023-05-05 NOTE — Progress Notes (Signed)
Virtual Visit via Video Note  I connected with Patrick [name] on 05/05/23 at 11:30 AM EST by a video enabled telemedicine application and verified that I am speaking with the correct person using two identifiers.  Location: Patient: Home Provider: [facility]   I discussed the limitations of evaluation and management by telemedicine and the availability of in person appointments. The patient expressed understanding and agreed to proceed.   I discussed the assessment and treatment plan with the patient. The patient was provided an opportunity to ask questions and all were answered. The patient agreed with the plan and demonstrated an understanding of the instructions.   The patient was advised to call back or seek an in-person evaluation if the symptoms worsen or if the condition fails to improve as anticipated.  I provided 30 minutes of non-face-to-face time during this encounter.   Aldene Hendon Mechele Collin, MD   Subjective:   PATIENT ID: Patrick [name] GENDER: male DOB: 1950-04-21, MRN: [id number]    Chief Complaint  Patient presents with   Follow-up    PFT    Reason for Visit: Follow-up  Patrick [name] is a 73 year old former smoker with GAD, moderate OSA and acid reflux who presents for follow-up.  Synopsis:  2020- Seen once for long standing dry hacking cough that occurs several times a day. Worse in the mornings. Denies wheezing. Triggered by cleaning solutions and wipes and strong scents. Can also be triggered when exposed to dust, grass, leaves. Also reports episodes of his throat closing up associated with sensations of feeling like he cannot breath. He has been taking singulair with some relief. He tried a rescue inhaler for a few weeks but did not notice a difference. On ROS, he reports a hx of reflux described as retrosternal burning. He is otherwise fairly active. He walks two miles daily. Only has shortness of breath with heavy exertion such as  running.  2022- Returned for chronic cough in May.  09/29/20 Since our last visit he reports his cough has nearly resolved. He had previously tried prednisone, Breo and albuterol. He was restarted on Breo on the last visit but did not have any effect. His cough has self-resolved and no longer is an issue. His work-up for his cough included CT and PFT which we will discuss today. His wife is present as well. Denies significant shortness of breath or wheezing or any other respiratory issues. He does have concern about his lung due to his family history of ILD however unclear if this was work-related vs genetic.  09/28/21 Denies any current cough, shortness of breath or wheezing. Not on any bronchodilators.  10/27/22 Since our last visit he had covid in July 4th. He developed post-viral cough that has lasted 6 weeks but 95% improved. Only bothers him in the evening. Reports persistent nasal congestion. Denies shortness of breath or wheezing. Does not relate to environmental factors. Has tried Breo and Advair Diskus in the past without relief. Improves with codeine cough syrup.  05/05/23 Since our last visit he had a cold in December that resulted in prolonged cough. Occasional cough. Denies wheezing or mucous production. No limitation in activity.  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 Previously smoked 2 ppd Father had ?ILD after working at [employer]. Exposure to chemicals, powders   Environmental exposures:  Prior home reno projects including insulation and flooring. No clear exposure to asbestos Intermittent projects with furniture including  sawing Soldering with metals  Past Medical History:  Diagnosis Date   Anxiety    Arthritis    Basal cell carcinoma    BPH (benign prostatic hyperplasia)    Depression    Dysphagia    Emphysema of lung (HCC)    Fibromyalgia    GERD (gastroesophageal reflux disease)    otc   tums   Headache    migraines   Herpes simplex type 1 infection    History of migraine    Hypertension    no longer on medication   Insomnia    Lower back pain    Morton's neuroma    OSA (obstructive sleep apnea)    Primary snoring    Recurrent canker sores    Sleep apnea    Urinary frequency     No Known Allergies   Outpatient Medications Prior to Visit  Medication Sig Dispense Refill   clonazePAM (KLONOPIN) 0.5 MG tablet TAKE ONE TO TWO TABLETS BY MOUTH EVERY NIGHT AT BEDTIME AS NEEDED FOR SLEEP 180 tablet 0   diclofenac Sodium (VOLTAREN ARTHRITIS PAIN) 1 % GEL Apply 2 gm to upper extremity joints and 4 gm to lower extremity joints up to 4x daily.  Max 32g total per day 300 g 3   famotidine (PEPCID) 20 MG tablet Take 1 tablet (20 mg total) by mouth 2 (two) times daily as needed for heartburn or indigestion. 180 tablet 0   fluticasone (FLONASE) 50 MCG/ACT nasal spray Place 2 sprays into both nostrils daily. 43 g 3   HYDROcodone-acetaminophen (NORCO/VICODIN) 5-325 MG tablet Take 1 tablet by mouth every 8 (eight) hours as needed for moderate pain. 30 tablet 0   indomethacin (INDOCIN) 25 MG capsule Take 1 to 2 capsules three times daily as needed for headache. 20 capsule 0   ipratropium (ATROVENT) 0.03 % nasal spray Place 2 sprays into both nostrils every 12 (twelve) hours. 30 mL 12   losartan (COZAAR) 50 MG tablet TAKE 1 TABLET BY MOUTH DAILY 90 tablet 3   Melatonin 5 MG TABS Take 5 mg by mouth at bedtime.     meloxicam (MOBIC) 15 MG tablet TAKE 1 TABLET BY MOUTH DAILY 90 tablet 3   methylphenidate (RITALIN) 20 MG tablet Take 1 tablet (20 mg total) by mouth 2 (two) times daily. Use as needed for chronic fatigue 30 tablet 0   montelukast (SINGULAIR) 10 MG tablet Take 1 tablet (10 mg total) by mouth at bedtime. 90 tablet 3   pantoprazole (PROTONIX) 40 MG tablet TAKE 1 TABLET BY MOUTH DAILY 90 tablet 1   phenol (CHLORASEPTIC) 1.4 % LIQD Use as directed 1 spray in the mouth or throat as  needed for throat irritation / pain. 177 mL 0   pregabalin (LYRICA) 100 MG capsule TAKE TWO CAPSULES BY MOUTH EVERY NIGHT AT BEDTIME 180 capsule 1   rosuvastatin (CRESTOR) 10 MG tablet Take 1 tablet (10 mg total) by mouth daily. 90 tablet 3   sildenafil (VIAGRA) 100 MG tablet TAKE ONE-HALF TO ONE TABLET BY MOUTH DAILY AS NEEDED 6 tablet 11   sucralfate (CARAFATE) 1 g tablet Take 1 tablet (1 g total) by mouth 4 (four) times daily -  with meals and at bedtime. 40 tablet 0   SUMAtriptan (IMITREX) 100 MG tablet Take 1 tablet earliest onset of migraine.  May repeat in 2 hours if headache persists or recurs.  Maximum 2 tablets in 24 hours. 10 tablet 5   topiramate (TOPAMAX) 100 MG tablet Take  1 tablet (100 mg total) by mouth daily. 90 tablet 3   traZODone (DESYREL) 150 MG tablet Take 1 tablet (150 mg total) by mouth at bedtime as needed for sleep. 90 tablet 1   vitamin B-12 (CYANOCOBALAMIN) 500 MCG tablet Take 500 mcg by mouth daily.     benzonatate (TESSALON) 200 MG capsule Take 1 capsule (200 mg total) by mouth 3 (three) times daily as needed for cough. (Patient not taking: Reported on 05/05/2023) 60 capsule 3   fluticasone-salmeterol (ADVAIR) 100-50 MCG/ACT AEPB Inhale 1 puff into the lungs 2 (two) times daily. **Rinse mouth after each use** (Patient not taking: Reported on 05/05/2023) 60 each 3   guaiFENesin (MUCINEX) 600 MG 12 hr tablet Take 2 tablets (1,200 mg total) by mouth 2 (two) times daily. (Patient not taking: Reported on 05/05/2023) 30 tablet 0   metoprolol tartrate (LOPRESSOR) 100 MG tablet Take 1 tablet (100 mg total) by mouth once for 1 dose. Take two hours prior to your cardiac CT 1 tablet 0   No facility-administered medications prior to visit.    Review of Systems  Constitutional:  Negative for chills, diaphoresis, fever, malaise/fatigue and weight loss.  HENT:  Positive for congestion.   Respiratory:  Positive for cough. Negative for hemoptysis, sputum production, shortness of breath  and wheezing.   Cardiovascular:  Negative for chest pain, palpitations and leg swelling.     Objective:   There were no vitals filed for this visit.     Physical Exam: General: Well-appearing, no acute distress HENT: , AT Eyes: EOMI, no scleral icterus Respiratory: No respiratory distress Extremities:-Edema,-tenderness Neuro: AAO x4, CNII-XII grossly intact Psych: Normal mood, normal affect   Data Reviewed:  Imaging: CT Chest Lung Screen 10/15/18 - Centrilobular emphysema, RLL calcified granuloma. Subpleural left base nodule, unchanged CT Chest Lung Screen 12/19/19 - Centrilobular emphysema. Unchanged subpleural left base nodule. RLL calcified granuloma CT Coronary - Lung parenchyma with minimal RML and lingula scarring and bilateral atelectasis. Enlarged pulmonary trunk. CT 09/24/20 - Mild patchy ground glass and septal thickening in the lung bases with LLL scarring. No honeycombing or traction bronchiectasis. 44 subpleural nodule in left lower lobe.  CT Chest 09/28/21 - Normal parenchyma. No significant GGO, septal thickening, subpleural reticulation, traction bronchiectasis or honeycombing present. Minimal scattered scarring. CXR 07/06/22 - No infiltrate effusion or edema  PFT: 09/29/20 FVC 4.67 (95%) FEV1 3.91 (107%) Ratio 82  TLC 89% DLCO 127% Interpretation: Normal spirometry  05/05/23 FVC 4.45 (93%) FEV1 3.70 (105%) Ratio 83  TLC 95% DLCO 106% Interpretation: Normal PFTs   Labs: CBC    Component Value Date/Time   WBC 3.9 (L) 11/21/2022 0847   RBC 4.57 11/21/2022 0847   HGB 14.3 11/21/2022 0847   HGB 15.2 04/02/2021 0836   HCT 43.4 11/21/2022 0847   PLT 122.0 (L) 11/21/2022 0847   PLT 128 (L) 04/02/2021 0836   MCV 94.8 11/21/2022 0847   MCH 31.6 11/14/2021 2021   MCHC 32.9 11/21/2022 0847   RDW 13.8 11/21/2022 0847   LYMPHSABS 1.2 11/14/2021 2021   MONOABS 0.4 11/14/2021 2021   EOSABS 0.2 11/14/2021 2021   BASOSABS 0.0 11/14/2021 2021   BMET    Component  Value Date/Time   NA 143 11/21/2022 0847   NA 143 07/03/2020 0827   K 4.4 11/21/2022 0847   CL 108 11/21/2022 0847   CO2 29 11/21/2022 0847   GLUCOSE 83 11/21/2022 0847   BUN 26 (H) 11/21/2022 0847   BUN 16 07/03/2020 0827  CREATININE 1.11 11/21/2022 0847   CREATININE 1.12 04/02/2021 0836   CALCIUM 9.3 11/21/2022 0847   GFRNONAA >60 11/14/2021 2021   GFRNONAA >60 04/02/2021 0836   GFRAA >60 09/26/2015 1505   Absolute eos 100  Sleep study: 08/17/14 Split night - AHI 15.8 08/28/14 CPAP titration - Recommend 10cm H20  Echocardiogram EF 55-60%, no WMA or valvular abnormalities    Assessment & Plan:   Discussion: 73 year old male former smoker with moderate OSA, GAD and reflux who presents for follow-up. Initially presented for chronic cough with negative work-up. However he returned in 2022 with cough and questionable CT imaging concerning for ILD. Repeat imaging in 09/2021 reviewed and no evidence of ILD with overall normal parenchyma. Previously had covid infection and has had prolonged respiratory symptoms when ill. Reviewed PFTs which overall normal but demonstrated DLCO reduced 127%>106% but still in normal range. Discussed early treatment of respiratory symptoms and not allow it to be prolonged. Recommend repeat PFTs in 1 year  Post-infectious cough - recurrent but improving after recent imaging --ORDER pulmonary function tests in 1 year    Health Maintenance Immunization History  Administered Date(s) Administered   Fluad Quad(high Dose 65+) 12/18/2019, 12/07/2020, 12/20/2021   Fluad Trivalent(High Dose 65+) 11/21/2022   Influenza, High Dose Seasonal PF 12/03/2018   Influenza,inj,Quad PF,6+ Mos 12/15/2016   Influenza-Unspecified 01/12/2018   Moderna SARS-COV2 Booster Vaccination 06/22/2020   Moderna Sars-Covid-2 Vaccination 04/15/2019, 05/12/2019   Pfizer Covid-19 Vaccine Bivalent Booster 28yrs & up 12/07/2020   Pneumococcal Conjugate-13 05/03/2018   Pneumococcal  Polysaccharide-23 07/31/2019   Tdap 07/20/2016   Zoster Recombinant(Shingrix) 07/28/2016, 10/28/2016   Zoster, Live 03/14/2013   CT Lung Screen - not qualified. >15 since quit smoking  Orders Placed This Encounter  Procedures   Pulmonary function test    Standing Status:   Future    Expiration Date:   05/04/2024    Where should this test be performed?:   [facility]    Full PFT: includes the following: basic spirometry, spirometry pre & post bronchodilator, diffusion capacity (DLCO), lung volumes:   Full PFT   No orders of the defined types were placed in this encounter.   Return in about 1 year (around 05/04/2024).   I have spent a total time of 30-minutes on the day of the appointment including chart review, data review, collecting history, coordinating care and discussing medical diagnosis and plan with the patient/family. Past medical history, allergies, medications were reviewed. Pertinent imaging, labs and tests included in this note have been reviewed and interpreted independently by me.  Jazmaine Fuelling Mechele Collin, MD  Pulmonary Critical Care 05/05/2023 1:57 PM

## 2023-05-05 NOTE — Patient Instructions (Signed)
  Post-infectious cough - recurrent but improving after recent imaging --ORDER pulmonary function tests in 1 year

## 2023-05-05 NOTE — Telephone Encounter (Signed)
Patient scheduled.  Nothing further needed.

## 2023-05-05 NOTE — Telephone Encounter (Signed)
Can you set him up for my chart video visit right now for her to go over?

## 2023-05-09 ENCOUNTER — Encounter (HOSPITAL_BASED_OUTPATIENT_CLINIC_OR_DEPARTMENT_OTHER): Payer: Medicare Other

## 2023-05-09 ENCOUNTER — Other Ambulatory Visit: Payer: Self-pay | Admitting: Family Medicine

## 2023-05-09 DIAGNOSIS — J45998 Other asthma: Secondary | ICD-10-CM

## 2023-05-09 DIAGNOSIS — E782 Mixed hyperlipidemia: Secondary | ICD-10-CM

## 2023-05-19 ENCOUNTER — Other Ambulatory Visit: Payer: Self-pay | Admitting: Family Medicine

## 2023-05-19 DIAGNOSIS — F411 Generalized anxiety disorder: Secondary | ICD-10-CM

## 2023-05-22 ENCOUNTER — Encounter: Payer: Self-pay | Admitting: Family Medicine

## 2023-05-28 NOTE — Patient Instructions (Incomplete)
 It was great to see you again today, I will be in touch with your labs

## 2023-05-28 NOTE — Progress Notes (Unsigned)
 [facility] [address] [place], [place] [address] [phone]  Date:  05/31/2023   Name:  Patrick [name]   DOB:  [birth year]   MRN:  [id number]  PCP:  Pearline Cables, MD    Chief Complaint: No chief complaint on file.   History of Present Illness:  Patrick [name] is a 73 y.o. very pleasant male patient who presents with the following:  [name] is seen today for periodic follow-up-  history of sleep apnea, HTN, headache, GERD, BPH He uses hydrocodone occasionally for various musculoskeletal pains Also using clonazepam   Most recent visit with myself was in September-at that time he complained of feeling fatigued and needing a nap in the afternoon a lot at the time.  B12 was found to be low, testosterone normal His lipase was mildly elevated-I requested that he do a recheck but this has not happened just yet I also placed referral for a sleep study-I do not believe this has happened yet  He is seeing Dr. Everardo All for cough Dr. [place] Case for arthritis  He is seen by GI, Dr Myrtie Neither for GERD sx   Need to update UDS today  Clonazepam as needed Pepcid Hydrocodone as needed Losartan 50 Singular 10 Lyrica 200 mg bedtime Crestor 10 Viagra as needed Trazodone 150 at bedtime Patient Active Problem List   Diagnosis Date Noted   Abnormal CT scan of lung 10/29/2020   Chronic bronchitis (HCC) 10/29/2020   Urinary frequency    Recurrent canker sores    Primary snoring    OSA (obstructive sleep apnea)    Morton's neuroma    Lower back pain    Insomnia    Hypertension    History of migraine    Herpes simplex type 1 infection    GERD (gastroesophageal reflux disease)    Headache    Fibromyalgia    Emphysema of lung (HCC)    Dysphagia    Depression    BPH (benign prostatic hyperplasia)    Basal cell carcinoma    Arthritis    Anxiety    Dizziness 11/29/2019   Acquired mallet deformity of finger of  right hand 11/25/2019   Chronic cough 02/09/2019   Tear of right rotator cuff 12/13/2017   Right rotator cuff tendinitis 01/10/2017   Hearing loss 12/15/2016   Chronic fatigue 10/09/2015   Generalized anxiety disorder 10/09/2015   Memory loss 10/09/2015   Chronic SI joint pain 05/25/2015   Recurrent major depressive disorder, in partial remission (HCC) 04/30/2015   Obstructive sleep apnea of adult 02/09/2015   Lumbar facet joint pain 04/09/2014   Benign prostatic hyperplasia with urinary obstruction 11/18/2013   Acid reflux 11/18/2013   Degenerative arthritis of lumbar spine 11/18/2013   Headache, migraine 11/18/2013   Anal pain 11/18/2013   Atopic dermatitis 11/18/2013   Bladder neck contracture 11/18/2013   Constipation 11/18/2013   Crush injury to finger 11/18/2013   Deviated nasal septum 11/18/2013   Empty sella syndrome (HCC) 11/18/2013   Abdominal pain 11/18/2013   Hematoma, subungual, third finger, left 11/18/2013   Hypertrophy of nasal turbinates 11/18/2013   Laceration of third finger, left 11/18/2013   Lateral epicondylitis 11/18/2013   Lymph nodes enlarged 11/18/2013   Nonallopathic lesion of sacral region 11/18/2013   OAB (overactive bladder) 11/18/2013   Open fracture of distal phalanx of third finger of left hand 11/18/2013   Other intervertebral disc degeneration, lumbar region  11/18/2013   External hemorrhoids with other complication 12/20/2011   Restless leg 05/27/2009    Past Medical History:  Diagnosis Date   Anxiety    Arthritis    Basal cell carcinoma    BPH (benign prostatic hyperplasia)    Depression    Dysphagia    Emphysema of lung (HCC)    Fibromyalgia    GERD (gastroesophageal reflux disease)    otc  tums   Headache    migraines   Herpes simplex type 1 infection    History of migraine    Hypertension    no longer on medication   Insomnia    Lower back pain    Morton's neuroma    OSA (obstructive sleep apnea)    Primary snoring     Recurrent canker sores    Sleep apnea    Urinary frequency     Past Surgical History:  Procedure Laterality Date   EPIDIDYMECTOMY     for spermatocele   EYE SURGERY Bilateral    lasik eye surgery   FOOT SURGERY Left    INGUINAL HERNIA REPAIR Left    01/25/1999   MOUTH SURGERY     pancreatic surgery r/t trauma     SEPTOPLASTY N/A 02/09/2015   Procedure: SEPTOPLASTY;  Surgeon: Drema Halon, MD;  Location: [facility] OR;  Service: ENT;  Laterality: N/A;   SKIN CANCER EXCISION     TONSILLECTOMY     TRANSURETHRAL INCISION OF PROSTATE     TURBINATE REDUCTION Bilateral 02/09/2015   Procedure: BILATERAL TURBINATE REDUCTION;  Surgeon: Drema Halon, MD;  Location: [facility] OR;  Service: ENT;  Laterality: Bilateral;   UVULOPALATOPHARYNGOPLASTY N/A 02/09/2015   Procedure: UVULOPALATOPHARYNGOPLASTY (UPPP);  Surgeon: Drema Halon, MD;  Location: [facility] OR;  Service: ENT;  Laterality: N/A;    Social History   Tobacco Use   Smoking status: Former    Current packs/day: 0.00    Average packs/day: 2.0 packs/day for 38.6 years (77.3 ttl pk-yrs)    Types: Cigarettes    Start date: 03/1967    Quit date: 10/29/2005    Years since quitting: 17.5   Smokeless tobacco: Never  Vaping Use   Vaping status: Never Used  Substance Use Topics   Alcohol use: Yes    Alcohol/week: 5.0 standard drinks of alcohol    Types: 2 Glasses of wine, 3 Cans of beer per week    Comment: 1 beer every other day   Drug use: No    Family History  Problem Relation Age of Onset   Coronary artery disease Mother    Stroke Mother    Hypertension Father    Anxiety disorder Brother    Skin cancer Brother    Stroke Brother    Colon cancer Neg Hx    Esophageal cancer Neg Hx    Stomach cancer Neg Hx     No Known Allergies  Medication list has been reviewed and updated.  Current Outpatient Medications on File Prior to Visit  Medication Sig Dispense Refill   benzonatate (TESSALON) 200 MG capsule Take 1  capsule (200 mg total) by mouth 3 (three) times daily as needed for cough. (Patient not taking: Reported on 05/05/2023) 60 capsule 3   clonazePAM (KLONOPIN) 0.5 MG tablet TAKE ONE TO TWO TABLETS BY MOUTH EVERY NIGHT AT BEDTIME AS NEEDED FOR SLEEP 180 tablet 0   diclofenac Sodium (VOLTAREN ARTHRITIS PAIN) 1 % GEL Apply 2 gm to upper extremity joints and 4 gm to lower  extremity joints up to 4x daily.  Max 32g total per day 300 g 3   famotidine (PEPCID) 20 MG tablet Take 1 tablet (20 mg total) by mouth 2 (two) times daily as needed for heartburn or indigestion. 180 tablet 0   fluticasone (FLONASE) 50 MCG/ACT nasal spray Place 2 sprays into both nostrils daily. 43 g 3   fluticasone-salmeterol (ADVAIR) 100-50 MCG/ACT AEPB Inhale 1 puff into the lungs 2 (two) times daily. **Rinse mouth after each use** (Patient not taking: Reported on 05/05/2023) 60 each 3   guaiFENesin (MUCINEX) 600 MG 12 hr tablet Take 2 tablets (1,200 mg total) by mouth 2 (two) times daily. (Patient not taking: Reported on 05/05/2023) 30 tablet 0   HYDROcodone-acetaminophen (NORCO/VICODIN) 5-325 MG tablet Take 1 tablet by mouth every 8 (eight) hours as needed for moderate pain. 30 tablet 0   indomethacin (INDOCIN) 25 MG capsule Take 1 to 2 capsules three times daily as needed for headache. 20 capsule 0   ipratropium (ATROVENT) 0.03 % nasal spray Place 2 sprays into both nostrils every 12 (twelve) hours. 30 mL 12   losartan (COZAAR) 50 MG tablet TAKE 1 TABLET BY MOUTH DAILY 90 tablet 3   Melatonin 5 MG TABS Take 5 mg by mouth at bedtime.     meloxicam (MOBIC) 15 MG tablet TAKE 1 TABLET BY MOUTH DAILY 90 tablet 3   methylphenidate (RITALIN) 20 MG tablet Take 1 tablet (20 mg total) by mouth 2 (two) times daily. Use as needed for chronic fatigue 30 tablet 0   metoprolol tartrate (LOPRESSOR) 100 MG tablet Take 1 tablet (100 mg total) by mouth once for 1 dose. Take two hours prior to your cardiac CT 1 tablet 0   montelukast (SINGULAIR) 10 MG  tablet TAKE 1 TABLET BY MOUTH AT BEDTIME 90 tablet 3   pantoprazole (PROTONIX) 40 MG tablet TAKE 1 TABLET BY MOUTH DAILY 90 tablet 1   phenol (CHLORASEPTIC) 1.4 % LIQD Use as directed 1 spray in the mouth or throat as needed for throat irritation / pain. 177 mL 0   pregabalin (LYRICA) 100 MG capsule TAKE TWO CAPSULES BY MOUTH EVERY NIGHT AT BEDTIME 180 capsule 1   rosuvastatin (CRESTOR) 10 MG tablet TAKE 1 TABLET BY MOUTH DAILY 90 tablet 3   sildenafil (VIAGRA) 100 MG tablet TAKE ONE-HALF TO ONE TABLET BY MOUTH DAILY AS NEEDED 6 tablet 11   sucralfate (CARAFATE) 1 g tablet Take 1 tablet (1 g total) by mouth 4 (four) times daily -  with meals and at bedtime. 40 tablet 0   SUMAtriptan (IMITREX) 100 MG tablet Take 1 tablet earliest onset of migraine.  May repeat in 2 hours if headache persists or recurs.  Maximum 2 tablets in 24 hours. 10 tablet 5   topiramate (TOPAMAX) 100 MG tablet Take 1 tablet (100 mg total) by mouth daily. 90 tablet 3   traZODone (DESYREL) 150 MG tablet Take 1 tablet (150 mg total) by mouth at bedtime as needed for sleep. 90 tablet 1   vitamin B-12 (CYANOCOBALAMIN) 500 MCG tablet Take 500 mcg by mouth daily.     No current facility-administered medications on file prior to visit.    Review of Systems:  As per HPI- otherwise negative.   Physical Examination: There were no vitals filed for this visit. There were no vitals filed for this visit. There is no height or weight on file to calculate BMI. Ideal Body Weight:    GEN: no acute distress. HEENT: Atraumatic,  Normocephalic.  Ears and Nose: No external deformity. CV: RRR, No M/G/R. No JVD. No thrill. No extra heart sounds. PULM: CTA B, no wheezes, crackles, rhonchi. No retractions. No resp. distress. No accessory muscle use. ABD: S, NT, ND, +BS. No rebound. No HSM. EXTR: No c/c/e PSYCH: Normally interactive. Conversant.    Assessment and Plan: ***  Signed Abbe Amsterdam, MD

## 2023-05-30 ENCOUNTER — Other Ambulatory Visit: Payer: Self-pay | Admitting: Family Medicine

## 2023-05-30 DIAGNOSIS — G43809 Other migraine, not intractable, without status migrainosus: Secondary | ICD-10-CM

## 2023-05-31 ENCOUNTER — Encounter: Payer: Self-pay | Admitting: Family Medicine

## 2023-05-31 ENCOUNTER — Ambulatory Visit (INDEPENDENT_AMBULATORY_CARE_PROVIDER_SITE_OTHER): Admitting: Family Medicine

## 2023-05-31 VITALS — BP 122/80 | HR 72 | Temp 98.1°F | Resp 18 | Ht 72.0 in | Wt 194.4 lb

## 2023-05-31 DIAGNOSIS — I10 Essential (primary) hypertension: Secondary | ICD-10-CM | POA: Diagnosis not present

## 2023-05-31 DIAGNOSIS — E538 Deficiency of other specified B group vitamins: Secondary | ICD-10-CM

## 2023-05-31 DIAGNOSIS — M47816 Spondylosis without myelopathy or radiculopathy, lumbar region: Secondary | ICD-10-CM

## 2023-05-31 DIAGNOSIS — R748 Abnormal levels of other serum enzymes: Secondary | ICD-10-CM | POA: Diagnosis not present

## 2023-05-31 DIAGNOSIS — Z5181 Encounter for therapeutic drug level monitoring: Secondary | ICD-10-CM

## 2023-05-31 DIAGNOSIS — R5382 Chronic fatigue, unspecified: Secondary | ICD-10-CM

## 2023-05-31 DIAGNOSIS — M5430 Sciatica, unspecified side: Secondary | ICD-10-CM

## 2023-05-31 LAB — CBC
HCT: 44.2 % (ref 39.0–52.0)
Hemoglobin: 14.8 g/dL (ref 13.0–17.0)
MCHC: 33.4 g/dL (ref 30.0–36.0)
MCV: 94.6 fl (ref 78.0–100.0)
Platelets: 130 10*3/uL — ABNORMAL LOW (ref 150.0–400.0)
RBC: 4.67 Mil/uL (ref 4.22–5.81)
RDW: 13.6 % (ref 11.5–15.5)
WBC: 3.5 10*3/uL — ABNORMAL LOW (ref 4.0–10.5)

## 2023-05-31 LAB — LIPASE: Lipase: 29 U/L (ref 11.0–59.0)

## 2023-05-31 LAB — BASIC METABOLIC PANEL
BUN: 20 mg/dL (ref 6–23)
CO2: 28 meq/L (ref 19–32)
Calcium: 9.3 mg/dL (ref 8.4–10.5)
Chloride: 107 meq/L (ref 96–112)
Creatinine, Ser: 1.03 mg/dL (ref 0.40–1.50)
GFR: 73.54 mL/min (ref >60.00)
Glucose, Bld: 96 mg/dL (ref 70–99)
Potassium: 4 meq/L (ref 3.5–5.1)
Sodium: 140 meq/L (ref 135–145)

## 2023-05-31 LAB — VITAMIN B12: Vitamin B-12: 550 pg/mL (ref 211–911)

## 2023-05-31 MED ORDER — HYDROCODONE-ACETAMINOPHEN 5-325 MG PO TABS
1.0000 | ORAL_TABLET | Freq: Three times a day (TID) | ORAL | 0 refills | Status: DC | PRN
Start: 2023-05-31 — End: 2023-09-21

## 2023-05-31 MED ORDER — METHYLPHENIDATE HCL 20 MG PO TABS: 20.0000 mg | ORAL_TABLET | Freq: Two times a day (BID) | ORAL | 0 refills | Status: DC

## 2023-06-02 LAB — DRUG MONITORING, PANEL 8 WITH CONFIRMATION, URINE
6 Acetylmorphine: NEGATIVE ng/mL (ref <10)
Alcohol Metabolites: POSITIVE ng/mL — AB (ref <500)
Amphetamines: NEGATIVE ng/mL (ref <500)
Benzodiazepines: NEGATIVE ng/mL (ref <100)
Buprenorphine, Urine: NEGATIVE ng/mL (ref <5)
Cocaine Metabolite: NEGATIVE ng/mL (ref <150)
Codeine: 53 ng/mL — ABNORMAL HIGH (ref <50)
Creatinine: 65.8 mg/dL (ref >=20.0)
Ethyl Glucuronide (ETG): 2099 ng/mL — ABNORMAL HIGH (ref <500)
Ethyl Sulfate (ETS): 818 ng/mL — ABNORMAL HIGH (ref <100)
Hydrocodone: NEGATIVE ng/mL (ref <50)
Hydromorphone: NEGATIVE ng/mL (ref <50)
MDMA: NEGATIVE ng/mL (ref <500)
Marijuana Metabolite: NEGATIVE ng/mL (ref <20)
Morphine: 218 ng/mL — ABNORMAL HIGH (ref <50)
Norhydrocodone: NEGATIVE ng/mL (ref <50)
Opiates: POSITIVE ng/mL — AB (ref <100)
Oxidant: NEGATIVE ug/mL (ref <200)
Oxycodone: NEGATIVE ng/mL (ref <100)
pH: 6.6 (ref 4.5–9.0)

## 2023-06-02 LAB — DM TEMPLATE

## 2023-06-24 ENCOUNTER — Other Ambulatory Visit: Payer: Self-pay | Admitting: Family Medicine

## 2023-07-17 ENCOUNTER — Other Ambulatory Visit: Payer: Self-pay | Admitting: Family Medicine

## 2023-07-17 DIAGNOSIS — F5101 Primary insomnia: Secondary | ICD-10-CM

## 2023-07-20 ENCOUNTER — Ambulatory Visit

## 2023-07-20 DIAGNOSIS — Z0000 Encounter for general adult medical examination without abnormal findings: Secondary | ICD-10-CM | POA: Diagnosis not present

## 2023-07-20 NOTE — Patient Instructions (Signed)
 Patrick [name] , Thank you for taking time to come for your Medicare Wellness Visit. I appreciate your ongoing commitment to your health goals. Please review the following plan we discussed and let me know if I can assist you in the future.   Referrals/Orders/Follow-Ups/Clinician Recommendations: none  This is a list of the screening recommended for you and due dates:  Health Maintenance  Topic Date Due   COVID-19 Vaccine (4 - 2024-25 season) 11/13/2022   Flu Shot  10/13/2023   Medicare Annual Wellness Visit  07/19/2024   Colon Cancer Screening  06/12/2025   DTaP/Tdap/Td vaccine (2 - Td or Tdap) 07/21/2026   Pneumonia Vaccine  Completed   Hepatitis C Screening  Completed   Zoster (Shingles) Vaccine  Completed   HPV Vaccine  Aged Out   Meningitis B Vaccine  Aged Out    Advanced directives: (Copy Requested) Please bring a copy of your health care power of attorney and living will to the office to be added to your chart at your convenience. You can mail to [facility] [address] [place], [place] [address] or email to [email or web address]  Next Medicare Annual Wellness Visit scheduled for next year: Yes  Have you seen your provider in the last 6 months (3 months if uncontrolled diabetes)? Yes, appointment 12/04/2023  insert Preventive Care attachment Insert FALL PREVENTION attachment if needed

## 2023-07-20 NOTE — Progress Notes (Signed)
 Subjective:   Patrick [name] is a 73 y.o. who presents for a Medicare Wellness preventive visit.  Visit Complete: Virtual I connected with  Patrick [name] on 07/20/23 by a audio enabled telemedicine application and verified that I am speaking with the correct person using two identifiers.  Patient Location: Home  Provider Location: Home Office  I discussed the limitations of evaluation and management by telemedicine. The patient expressed understanding and agreed to proceed.  Vital Signs: Because this visit was a virtual/telehealth visit, some criteria may be missing or patient reported. Any vitals not documented were not able to be obtained and vitals that have been documented are patient reported.  VideoError- Librarian, academic were attempted between this provider and patient, however failed, due to patient having technical difficulties OR patient did not have access to video capability.  We continued and completed visit with audio only.   Persons Participating in Visit: Patient.  AWV Questionnaire: No: Patient Medicare AWV questionnaire was not completed prior to this visit.  Cardiac Risk Factors include: advanced age (>69men, >34 women);hypertension;male gender     Objective:    Today's Vitals   07/20/23 1007  PainSc: 5    There is no height or weight on file to calculate BMI.     07/20/2023   10:16 AM 07/08/2022    9:40 AM 11/14/2021    6:59 PM 04/02/2021    8:55 AM 12/07/2020    7:48 AM 04/23/2020   11:08 AM 09/26/2015    2:41 PM  Advanced Directives  Does Patient Have a Medical Advance Directive? Yes Yes No Yes Yes No No  Type of Estate agent of [place];Living will Healthcare Power of [place];Living will  Healthcare Power of [place];Living will Healthcare Power of [place];Living will    Does patient want to make changes to medical advance directive?  No - Patient declined  No - Patient declined     Copy  of Healthcare Power of Attorney in Chart? No - copy requested No - copy requested  No - copy requested No - copy requested    Would patient like information on creating a medical advance directive?       No - patient declined information    Current Medications (verified) Outpatient Encounter Medications as of 07/20/2023  Medication Sig   benzonatate  (TESSALON ) 200 MG capsule Take 1 capsule (200 mg total) by mouth 3 (three) times daily as needed for cough.   clonazePAM  (KLONOPIN ) 0.5 MG tablet TAKE ONE TO TWO TABLETS BY MOUTH EVERY NIGHT AT BEDTIME AS NEEDED FOR SLEEP   diclofenac  Sodium (VOLTAREN  ARTHRITIS PAIN) 1 % GEL Apply 2 gm to upper extremity joints and 4 gm to lower extremity joints up to 4x daily.  Max 32g total per day   famotidine  (PEPCID ) 20 MG tablet Take 1 tablet (20 mg total) by mouth 2 (two) times daily as needed for heartburn or indigestion.   fluticasone  (FLONASE ) 50 MCG/ACT nasal spray Place 2 sprays into both nostrils daily.   guaiFENesin  (MUCINEX ) 600 MG 12 hr tablet Take 2 tablets (1,200 mg total) by mouth 2 (two) times daily.   HYDROcodone -acetaminophen  (NORCO/VICODIN) 5-325 MG tablet Take 1 tablet by mouth every 8 (eight) hours as needed for moderate pain (pain score 4-6).   indomethacin  (INDOCIN ) 25 MG capsule Take 1 to 2 capsules three times daily as needed for headache.   ipratropium (ATROVENT ) 0.03 % nasal spray Place 2 sprays into both nostrils every 12 (twelve)  hours.   losartan  (COZAAR ) 50 MG tablet TAKE 1 TABLET BY MOUTH DAILY   Melatonin 5 MG TABS Take 5 mg by mouth at bedtime.   meloxicam  (MOBIC ) 15 MG tablet TAKE 1 TABLET BY MOUTH DAILY   methylphenidate  (RITALIN ) 20 MG tablet Take 1 tablet (20 mg total) by mouth 2 (two) times daily. Use as needed for chronic fatigue   montelukast  (SINGULAIR ) 10 MG tablet TAKE 1 TABLET BY MOUTH AT BEDTIME   pantoprazole  (PROTONIX ) 40 MG tablet TAKE 1 TABLET BY MOUTH DAILY   phenol (CHLORASEPTIC) 1.4 % LIQD Use as directed 1  spray in the mouth or throat as needed for throat irritation / pain.   pregabalin  (LYRICA ) 100 MG capsule TAKE TWO CAPSULES BY MOUTH EVERY NIGHT AT BEDTIME   rosuvastatin  (CRESTOR ) 10 MG tablet TAKE 1 TABLET BY MOUTH DAILY   sildenafil  (VIAGRA ) 100 MG tablet TAKE ONE-HALF TO ONE TABLET BY MOUTH DAILY AS NEEDED   sucralfate  (CARAFATE ) 1 g tablet Take 1 tablet (1 g total) by mouth 4 (four) times daily -  with meals and at bedtime.   SUMAtriptan  (IMITREX ) 100 MG tablet Take 1 tablet earliest onset of migraine.  May repeat in 2 hours if headache persists or recurs.  Maximum 2 tablets in 24 hours.   topiramate  (TOPAMAX ) 100 MG tablet TAKE 1 TABLET BY MOUTH DAILY   traZODone  (DESYREL ) 150 MG tablet Take 1 tablet (150 mg total) by mouth at bedtime as needed for sleep.   vitamin B-12 (CYANOCOBALAMIN ) 500 MCG tablet Take 500 mcg by mouth daily.   No facility-administered encounter medications on file as of 07/20/2023.    Allergies (verified) Patient has no known allergies.   History: Past Medical History:  Diagnosis Date   Anxiety    Arthritis    Basal cell carcinoma    BPH (benign prostatic hyperplasia)    Depression    Dysphagia    Emphysema of lung (HCC)    Fibromyalgia    GERD (gastroesophageal reflux disease)    otc  tums   Headache    migraines   Herpes simplex type 1 infection    History of migraine    Hypertension    no longer on medication   Insomnia    Lower back pain    Morton's neuroma    OSA (obstructive sleep apnea)    Primary snoring    Recurrent canker sores    Sleep apnea    Urinary frequency    Past Surgical History:  Procedure Laterality Date   EPIDIDYMECTOMY     for spermatocele   EYE SURGERY Bilateral    lasik eye surgery   FOOT SURGERY Left    INGUINAL HERNIA REPAIR Left    01/25/1999   MOUTH SURGERY     pancreatic surgery r/t trauma     SEPTOPLASTY N/A 02/09/2015   Procedure: SEPTOPLASTY;  Surgeon: Prescott Brodie, MD;  Location[facility] OR;   Service: ENT;  Laterality: N/A;   SKIN CANCER EXCISION     TONSILLECTOMY     TRANSURETHRAL INCISION OF PROSTATE     TURBINATE REDUCTION Bilateral 02/09/2015   Procedure: BILATERAL TURBINATE REDUCTION;  Surgeon: Prescott Brodie, MD;  Location: [facility] OR;  Service: ENT;  Laterality: Bilateral;   UVULOPALATOPHARYNGOPLASTY N/A 02/09/2015   Procedure: UVULOPALATOPHARYNGOPLASTY (UPPP);  Surgeon: Prescott Brodie, MD;  Location[facility] OR;  Service: ENT;  Laterality: N/A;   Family History  Problem Relation Age of Onset   Coronary artery disease Mother  Stroke Mother    Hypertension Father    Anxiety disorder Brother    Skin cancer Brother    Stroke Brother    Colon cancer Neg Hx    Esophageal cancer Neg Hx    Stomach cancer Neg Hx    Social History   Socioeconomic History   Marital status: Married    Spouse name: Not on file   Number of children: 2   Years of education: BS    Highest education level: Bachelor's degree (e.g., BA, AB, BS)  Occupational History   Occupation: Art gallery manager   Occupation: Retired  Tobacco Use   Smoking status: Former    Current packs/day: 0.00    Average packs/day: 2.0 packs/day for 38.6 years (77.3 ttl pk-yrs)    Types: Cigarettes    Start date: 03/1967    Quit date: 10/29/2005    Years since quitting: 17.7   Smokeless tobacco: Never  Vaping Use   Vaping status: Never Used  Substance and Sexual Activity   Alcohol use: Yes    Alcohol/week: 5.0 standard drinks of alcohol    Types: 2 Glasses of wine, 3 Cans of beer per week    Comment: 1 beer every other day   Drug use: No   Sexual activity: Yes    Birth control/protection: None  Other Topics Concern   Not on file  Social History Narrative   Denies caffeine use    Right handed   Social Drivers of Health   Financial Resource Strain: Low Risk  (07/20/2023)   Overall Financial Resource Strain (CARDIA)    Difficulty of Paying Living Expenses: Not hard at all  Food Insecurity: No Food Insecurity  (07/20/2023)   Hunger Vital Sign    Worried About Running Out of Food in the Last Year: Never true    Ran Out of Food in the Last Year: Never true  Transportation Needs: No Transportation Needs (07/20/2023)   PRAPARE - Administrator, Civil Service (Medical): No    Lack of Transportation (Non-Medical): No  Physical Activity: Insufficiently Active (07/20/2023)   Exercise Vital Sign    Days of Exercise per Week: 7 days    Minutes of Exercise per Session: 10 min  Stress: No Stress Concern Present (07/20/2023)   [employer] of Occupational Health - Occupational Stress Questionnaire    Feeling of Stress : Not at all  Social Connections: Socially Integrated (07/20/2023)   Social Connection and Isolation Panel [NHANES]    Frequency of Communication with Friends and Family: More than three times a week    Frequency of Social Gatherings with Friends and Family: Once a week    Attends Religious Services: More than 4 times per year    Active Member of [employer] or Organizations: Yes    Attends Engineer, structural: More than 4 times per year    Marital Status: Married    Tobacco Counseling Counseling given: Not Answered    Clinical Intake:  Pre-visit preparation completed: Yes  Pain : 0-10 Pain Score: 5  Pain Type: Chronic pain Pain Location: Knee Pain Orientation: Left Pain Descriptors / Indicators: Aching Pain Onset: More than a month ago Pain Frequency: Constant     Nutritional Risks: None Diabetes: No  Lab Results  Component Value Date   HGBA1C 5.3 11/21/2022   HGBA1C 5.3 03/25/2021   HGBA1C 5.4 07/15/2020     How often do you need to have someone help you when you read instructions, pamphlets, or other  written materials from your doctor or pharmacy?: 1 - Never  Interpreter Needed?: No  Information entered by :: NAllen LPN   Activities of Daily Living     07/20/2023   10:09 AM  In your present state of health, do you have any difficulty performing  the following activities:  Hearing? 1  Comment wears hearing aids  Vision? 0  Difficulty concentrating or making decisions? 0  Walking or climbing stairs? 0  Dressing or bathing? 0  Doing errands, shopping? 0  Preparing Food and eating ? N  Using the Toilet? N  In the past six months, have you accidently leaked urine? N  Do you have problems with loss of bowel control? N  Managing your Medications? N  Managing your Finances? N  Housekeeping or managing your Housekeeping? N    Patient Care Team: Copland, Skipper Dumas, MD as PCP - General (Family Medicine) Prescott Brodie, MD (Inactive) as Consulting Physician (Otolaryngology) Ali Ink, PhD [facility]) Alba Ally, MD as Consulting Physician (Psychiatry) Mindy Alu, MD as Consulting Physician (Dermatology)  Indicate any recent Medical Services you may have received from other than [facility] providers in the past year (date may be approximate).     Assessment:    This is a routine wellness examination for [place].  Hearing/Vision screen Hearing Screening - Comments:: Has hearing aids that are maintained Vision Screening - Comments:: Regular eye exams, Progressive vision   Goals Addressed             This Visit's Progress    Patient Stated       07/20/2023, denies goals       Depression Screen     07/20/2023   10:18 AM 11/21/2022    8:25 AM 07/08/2022    9:42 AM 12/20/2021    1:45 PM 12/07/2020    7:51 AM 10/09/2017   12:35 PM 03/23/2016   11:09 AM  PHQ 2/9 Scores  PHQ - 2 Score 0 0 0 0 0 2 0  PHQ- 9 Score    0  5     Fall Risk     07/20/2023   10:17 AM 11/21/2022    8:25 AM 07/08/2022    9:41 AM 12/20/2021    1:45 PM 12/07/2020    7:50 AM  Fall Risk   Falls in the past year? 0 0 0 0 0  Number falls in past yr: 0 0 0 0 0  Injury with Fall? 0 0 0 0 0  Risk for fall due to : Medication side effect No Fall Risks No Fall Risks    Follow up Falls prevention discussed;Falls evaluation completed  Falls evaluation completed Falls evaluation completed Falls evaluation completed Falls prevention discussed    MEDICARE RISK AT HOME:  Medicare Risk at Home Any stairs in or around the home?: Yes If so, are there any without handrails?: No Home free of loose throw rugs in walkways, pet beds, electrical cords, etc?: Yes Adequate lighting in your home to reduce risk of falls?: Yes Life alert?: No Use of a cane, walker or w/c?: No Grab bars in the bathroom?: No Shower chair or bench in shower?: No Elevated toilet seat or a handicapped toilet?: Yes  TIMED UP AND GO:  Was the test performed?  No  Cognitive Function: 6CIT completed        07/20/2023   10:18 AM 07/08/2022    9:46 AM  6CIT Screen  What Year? 0 points 0 points  What  month? 0 points 0 points  What time? 0 points 0 points  Count back from 20 0 points 0 points  Months in reverse 0 points 0 points  Repeat phrase 0 points 0 points  Total Score 0 points 0 points    Immunizations Immunization History  Administered Date(s) Administered   Fluad Quad(high Dose 65+) 12/18/2019, 12/07/2020, 12/20/2021   Fluad Trivalent(High Dose 65+) 11/21/2022   Influenza, High Dose Seasonal PF 12/03/2018   Influenza,inj,Quad PF,6+ Mos 12/15/2016   Influenza-Unspecified 01/12/2018   Moderna SARS-COV2 Booster Vaccination 06/22/2020   Moderna Sars-Covid-2 Vaccination 04/15/2019, 05/12/2019   Pfizer Covid-19 Vaccine Bivalent Booster 71yrs & up 12/07/2020   Pneumococcal Conjugate-13 05/03/2018   Pneumococcal Polysaccharide-23 07/31/2019   Tdap 07/20/2016   Zoster Recombinant(Shingrix ) 07/28/2016, 10/28/2016   Zoster, Live 03/14/2013    Screening Tests Health Maintenance  Topic Date Due   COVID-19 Vaccine (4 - 2024-25 season) 11/13/2022   INFLUENZA VACCINE  10/13/2023   Medicare Annual Wellness (AWV)  07/19/2024   Colonoscopy  06/12/2025   DTaP/Tdap/Td (2 - Td or Tdap) 07/21/2026   Pneumonia Vaccine 62+ Years old  Completed    Hepatitis C Screening  Completed   Zoster Vaccines- Shingrix   Completed   HPV VACCINES  Aged Out   Meningococcal B Vaccine  Aged Out    Health Maintenance  Health Maintenance Due  Topic Date Due   COVID-19 Vaccine (4 - 2024-25 season) 11/13/2022   Health Maintenance Items Addressed: Declines covid vaccine  Additional Screening:  Vision Screening: Recommended annual ophthalmology exams for early detection of glaucoma and other disorders of the eye.  Dental Screening: Recommended annual dental exams for proper oral hygiene  Community Resource Referral / Chronic Care Management: CRR required this visit?  No   CCM required this visit?  No     Plan:     I have personally reviewed and noted the following in the patient's chart:   Medical and social history Use of alcohol, tobacco or illicit drugs  Current medications and supplements including opioid prescriptions. Patient is not currently taking opioid prescriptions. Functional ability and status Nutritional status Physical activity Advanced directives List of other physicians Hospitalizations, surgeries, and ER visits in previous 12 months Vitals Screenings to include cognitive, depression, and falls Referrals and appointments  In addition, I have reviewed and discussed with patient certain preventive protocols, quality metrics, and best practice recommendations. A written personalized care plan for preventive services as well as general preventive health recommendations were provided to patient.     Areatha Beecham, LPN   03/18/7827   After Visit Summary: (MyChart) Due to this being a telephonic visit, the after visit summary with patients personalized plan was offered to patient via MyChart   Notes: Nothing significant to report at this time.

## 2023-08-09 ENCOUNTER — Other Ambulatory Visit: Payer: Self-pay | Admitting: Family Medicine

## 2023-08-09 DIAGNOSIS — F411 Generalized anxiety disorder: Secondary | ICD-10-CM

## 2023-08-30 ENCOUNTER — Encounter: Payer: Self-pay | Admitting: Family Medicine

## 2023-08-30 ENCOUNTER — Other Ambulatory Visit: Payer: Self-pay | Admitting: Family Medicine

## 2023-08-30 DIAGNOSIS — F411 Generalized anxiety disorder: Secondary | ICD-10-CM

## 2023-08-30 DIAGNOSIS — R5382 Chronic fatigue, unspecified: Secondary | ICD-10-CM

## 2023-08-30 DIAGNOSIS — G894 Chronic pain syndrome: Secondary | ICD-10-CM

## 2023-08-30 MED ORDER — METHYLPHENIDATE HCL 20 MG PO TABS
20.0000 mg | ORAL_TABLET | Freq: Two times a day (BID) | ORAL | 0 refills | Status: DC
Start: 2023-08-30 — End: 2023-09-21

## 2023-08-30 MED ORDER — CLONAZEPAM 0.5 MG PO TABS: ORAL_TABLET | ORAL | 0 refills | Status: DC

## 2023-08-30 NOTE — Addendum Note (Signed)
 Addended by: Gates Kasal C on: 08/30/2023 12:15 PM   Modules accepted: Orders

## 2023-09-14 ENCOUNTER — Other Ambulatory Visit: Payer: Self-pay | Admitting: Family Medicine

## 2023-09-14 DIAGNOSIS — K219 Gastro-esophageal reflux disease without esophagitis: Secondary | ICD-10-CM

## 2023-09-20 NOTE — Progress Notes (Unsigned)
 [facility] [address], [address] [place], [place] [address] [phone]  Date:  09/21/2023   Name:  Patrick [name]   DOB:  [birth year]   MRN:  [id number]  PCP:  Watt Harlene BROCKS, MD    Chief Complaint: No chief complaint on file.   History of Present Illness:  Patrick [name] is a 73 y.o. very pleasant male patient who presents with the following:  Pt seen today with concern of back pain Last seen by myself in March  history of sleep apnea, HTN, headache, GERD, BPH He uses hydrocodone  occasionally for various musculoskeletal pains Also using clonazepam  for anxiety with success  Patient Active Problem List   Diagnosis Date Noted   Abnormal CT scan of lung 10/29/2020   Chronic bronchitis (HCC) 10/29/2020   Urinary frequency    Recurrent canker sores    Primary snoring    OSA (obstructive sleep apnea)    Morton's neuroma    Lower back pain    Insomnia    Hypertension    History of migraine    Herpes simplex type 1 infection    GERD (gastroesophageal reflux disease)    Headache    Fibromyalgia    Emphysema of lung (HCC)    Dysphagia    Depression    BPH (benign prostatic hyperplasia)    Basal cell carcinoma    Arthritis    Anxiety    Dizziness 11/29/2019   Acquired mallet deformity of finger of right hand 11/25/2019   Chronic cough 02/09/2019   Tear of right rotator cuff 12/13/2017   Right rotator cuff tendinitis 01/10/2017   Hearing loss 12/15/2016   Chronic fatigue 10/09/2015   Generalized anxiety disorder 10/09/2015   Memory loss 10/09/2015   Chronic SI joint pain 05/25/2015   Recurrent major depressive disorder, in partial remission (HCC) 04/30/2015   Obstructive sleep apnea of adult 02/09/2015   Lumbar facet joint pain 04/09/2014   Benign prostatic hyperplasia with urinary obstruction 11/18/2013   Acid reflux 11/18/2013   Degenerative arthritis of lumbar spine 11/18/2013   Headache,  migraine 11/18/2013   Anal pain 11/18/2013   Atopic dermatitis 11/18/2013   Bladder neck contracture 11/18/2013   Constipation 11/18/2013   Crush injury to finger 11/18/2013   Deviated nasal septum 11/18/2013   Empty sella syndrome (HCC) 11/18/2013   Abdominal pain 11/18/2013   Hematoma, subungual, third finger, left 11/18/2013   Hypertrophy of nasal turbinates 11/18/2013   Laceration of third finger, left 11/18/2013   Lateral epicondylitis 11/18/2013   Lymph nodes enlarged 11/18/2013   Nonallopathic lesion of sacral region 11/18/2013   OAB (overactive bladder) 11/18/2013   Open fracture of distal phalanx of third finger of left hand 11/18/2013   Other intervertebral disc degeneration, lumbar region 11/18/2013   External hemorrhoids with other complication 12/20/2011   Restless leg 05/27/2009    Past Medical History:  Diagnosis Date   Anxiety    Arthritis    Basal cell carcinoma    BPH (benign prostatic hyperplasia)    Depression    Dysphagia    Emphysema of lung (HCC)    Fibromyalgia    GERD (gastroesophageal reflux disease)    otc  tums   Headache    migraines   Herpes simplex type 1 infection    History of migraine    Hypertension    no longer on medication   Insomnia    Lower back pain  Morton's neuroma    OSA (obstructive sleep apnea)    Primary snoring    Recurrent canker sores    Sleep apnea    Urinary frequency     Past Surgical History:  Procedure Laterality Date   EPIDIDYMECTOMY     for spermatocele   EYE SURGERY Bilateral    lasik eye surgery   FOOT SURGERY Left    INGUINAL HERNIA REPAIR Left    01/25/1999   MOUTH SURGERY     pancreatic surgery r/t trauma     SEPTOPLASTY N/A 02/09/2015   Procedure: SEPTOPLASTY;  Surgeon: Lonni FORBES Angle, MD;  Location: [facility] OR;  Service: ENT;  Laterality: N/A;   SKIN CANCER EXCISION     TONSILLECTOMY     TRANSURETHRAL INCISION OF PROSTATE     TURBINATE REDUCTION Bilateral 02/09/2015   Procedure:  BILATERAL TURBINATE REDUCTION;  Surgeon: Lonni FORBES Angle, MD;  Location: [facility] OR;  Service: ENT;  Laterality: Bilateral;   UVULOPALATOPHARYNGOPLASTY N/A 02/09/2015   Procedure: UVULOPALATOPHARYNGOPLASTY (UPPP);  Surgeon: Lonni FORBES Angle, MD;  Location: [facility] OR;  Service: ENT;  Laterality: N/A;    Social History   Tobacco Use   Smoking status: Former    Current packs/day: 0.00    Average packs/day: 2.0 packs/day for 38.6 years (77.3 ttl pk-yrs)    Types: Cigarettes    Start date: 03/1967    Quit date: 10/29/2005    Years since quitting: 17.9   Smokeless tobacco: Never  Vaping Use   Vaping status: Never Used  Substance Use Topics   Alcohol use: Yes    Alcohol/week: 5.0 standard drinks of alcohol    Types: 2 Glasses of wine, 3 Cans of beer per week    Comment: 1 beer every other day   Drug use: No    Family History  Problem Relation Age of Onset   Coronary artery disease Mother    Stroke Mother    Hypertension Father    Anxiety disorder Brother    Skin cancer Brother    Stroke Brother    Colon cancer Neg Hx    Esophageal cancer Neg Hx    Stomach cancer Neg Hx     No Known Allergies  Medication list has been reviewed and updated.  Current Outpatient Medications on File Prior to Visit  Medication Sig Dispense Refill   benzonatate  (TESSALON ) 200 MG capsule Take 1 capsule (200 mg total) by mouth 3 (three) times daily as needed for cough. 60 capsule 3   clonazePAM  (KLONOPIN ) 0.5 MG tablet TAKE 1 TO 2 TABLETS BY MOUTH EVERY NIGHT AT BEDTIME AS NEEDED FOR SLEEP 180 tablet 0   diclofenac  Sodium (VOLTAREN  ARTHRITIS PAIN) 1 % GEL Apply 2 gm to upper extremity joints and 4 gm to lower extremity joints up to 4x daily.  Max 32g total per day 300 g 3   famotidine  (PEPCID ) 20 MG tablet Take 1 tablet (20 mg total) by mouth 2 (two) times daily as needed for heartburn or indigestion. 180 tablet 0   fluticasone  (FLONASE ) 50 MCG/ACT nasal spray Place 2 sprays into both nostrils daily.  43 g 3   guaiFENesin  (MUCINEX ) 600 MG 12 hr tablet Take 2 tablets (1,200 mg total) by mouth 2 (two) times daily. 30 tablet 0   HYDROcodone -acetaminophen  (NORCO/VICODIN) 5-325 MG tablet Take 1 tablet by mouth every 8 (eight) hours as needed for moderate pain (pain score 4-6). 30 tablet 0   indomethacin  (INDOCIN ) 25 MG capsule Take 1 to 2 capsules  three times daily as needed for headache. 20 capsule 0   ipratropium (ATROVENT ) 0.03 % nasal spray Place 2 sprays into both nostrils every 12 (twelve) hours. 30 mL 12   losartan  (COZAAR ) 50 MG tablet TAKE 1 TABLET BY MOUTH DAILY 90 tablet 3   Melatonin 5 MG TABS Take 5 mg by mouth at bedtime.     meloxicam  (MOBIC ) 15 MG tablet TAKE 1 TABLET BY MOUTH DAILY 90 tablet 3   methylphenidate  (RITALIN ) 20 MG tablet Take 1 tablet (20 mg total) by mouth 2 (two) times daily. Use as needed for chronic fatigue 30 tablet 0   montelukast  (SINGULAIR ) 10 MG tablet TAKE 1 TABLET BY MOUTH AT BEDTIME 90 tablet 3   pantoprazole  (PROTONIX ) 40 MG tablet TAKE 1 TABLET BY MOUTH DAILY 90 tablet 1   phenol (CHLORASEPTIC) 1.4 % LIQD Use as directed 1 spray in the mouth or throat as needed for throat irritation / pain. 177 mL 0   pregabalin  (LYRICA ) 100 MG capsule TAKE TWO CAPSULES BY MOUTH EVERY NIGHT AT BEDTIME 180 capsule 1   rosuvastatin  (CRESTOR ) 10 MG tablet TAKE 1 TABLET BY MOUTH DAILY 90 tablet 3   sildenafil  (VIAGRA ) 100 MG tablet TAKE ONE-HALF TO ONE TABLET BY MOUTH DAILY AS NEEDED 6 tablet 11   sucralfate  (CARAFATE ) 1 g tablet Take 1 tablet (1 g total) by mouth 4 (four) times daily -  with meals and at bedtime. 40 tablet 0   SUMAtriptan  (IMITREX ) 100 MG tablet Take 1 tablet earliest onset of migraine.  May repeat in 2 hours if headache persists or recurs.  Maximum 2 tablets in 24 hours. 10 tablet 5   topiramate  (TOPAMAX ) 100 MG tablet TAKE 1 TABLET BY MOUTH DAILY 90 tablet 3   traZODone  (DESYREL ) 150 MG tablet Take 1 tablet (150 mg total) by mouth at bedtime as needed for  sleep. 90 tablet 1   vitamin B-12 (CYANOCOBALAMIN ) 500 MCG tablet Take 500 mcg by mouth daily.     No current facility-administered medications on file prior to visit.    Review of Systems:  As per HPI- otherwise negative.   Physical Examination: There were no vitals filed for this visit. There were no vitals filed for this visit. There is no height or weight on file to calculate BMI. Ideal Body Weight:    GEN: no acute distress. HEENT: Atraumatic, Normocephalic.  Ears and Nose: No external deformity. CV: RRR, No M/G/R. No JVD. No thrill. No extra heart sounds. PULM: CTA B, no wheezes, crackles, rhonchi. No retractions. No resp. distress. No accessory muscle use. ABD: S, NT, ND, +BS. No rebound. No HSM. EXTR: No c/c/e PSYCH: Normally interactive. Conversant.    Assessment and Plan: ***  Signed Harlene Schroeder, MD

## 2023-09-21 ENCOUNTER — Encounter: Payer: Self-pay | Admitting: Sports Medicine

## 2023-09-21 ENCOUNTER — Ambulatory Visit (INDEPENDENT_AMBULATORY_CARE_PROVIDER_SITE_OTHER): Admitting: Family Medicine

## 2023-09-21 ENCOUNTER — Encounter: Payer: Self-pay | Admitting: Family Medicine

## 2023-09-21 ENCOUNTER — Ambulatory Visit (INDEPENDENT_AMBULATORY_CARE_PROVIDER_SITE_OTHER): Admitting: Sports Medicine

## 2023-09-21 VITALS — BP 144/82 | HR 69 | Ht 72.0 in | Wt 189.2 lb

## 2023-09-21 VITALS — BP 118/80 | Ht 72.0 in | Wt 189.0 lb

## 2023-09-21 DIAGNOSIS — M5430 Sciatica, unspecified side: Secondary | ICD-10-CM | POA: Diagnosis not present

## 2023-09-21 DIAGNOSIS — M5136 Other intervertebral disc degeneration, lumbar region with discogenic back pain only: Secondary | ICD-10-CM | POA: Diagnosis present

## 2023-09-21 DIAGNOSIS — M47816 Spondylosis without myelopathy or radiculopathy, lumbar region: Secondary | ICD-10-CM | POA: Diagnosis not present

## 2023-09-21 DIAGNOSIS — R5382 Chronic fatigue, unspecified: Secondary | ICD-10-CM

## 2023-09-21 DIAGNOSIS — M545 Low back pain, unspecified: Secondary | ICD-10-CM | POA: Diagnosis not present

## 2023-09-21 DIAGNOSIS — G8929 Other chronic pain: Secondary | ICD-10-CM

## 2023-09-21 MED ORDER — HYDROCODONE-ACETAMINOPHEN 5-325 MG PO TABS: 1.0000 | ORAL_TABLET | Freq: Three times a day (TID) | ORAL | 0 refills | Status: DC | PRN

## 2023-09-21 MED ORDER — METHYLPREDNISOLONE ACETATE 40 MG/ML IJ SUSP
40.0000 mg | Freq: Once | INTRAMUSCULAR | Status: AC
  Administered 2023-09-21: 80 mg via INTRA_ARTICULAR

## 2023-09-21 MED ORDER — KETOROLAC TROMETHAMINE 60 MG/2ML IM SOLN
60.0000 mg | Freq: Once | INTRAMUSCULAR | Status: AC
  Administered 2023-09-21: 60 mg via INTRAMUSCULAR

## 2023-09-21 MED ORDER — METHYLPHENIDATE HCL 20 MG PO TABS: 20.0000 mg | ORAL_TABLET | Freq: Two times a day (BID) | ORAL | 0 refills | Status: AC

## 2023-09-21 MED ORDER — PREDNISONE 10 MG PO TABS: ORAL_TABLET | ORAL | 0 refills | Status: AC

## 2023-09-21 NOTE — Progress Notes (Signed)
   Subjective:    Patient ID: Patrick [name], male    DOB: November 26, 1950, 73 y.o.   MRN: [id number]  HPI chief complaint: Low back pain  Patrick [name] is a pleasant 73 year old male that presents today with acute on chronic left-sided low back pain.  He has had similar pain in the past.  He usually receives spinal injections by Dr. Maryelizabeth at [facility].  Unfortunately, they are unable to see him until August.  His current pain began 2 weeks ago.  It is much worse with sitting and improves with standing.  He denies radiating pain into his legs.  Denies numbness or tingling.  He has been trying to manage this with hydrocodone  and meloxicam .  Past medical history reviewed Medications reviewed Allergies reviewed  Review of Systems As above    Objective:   Physical Exam  Well-developed, well-nourished.  He does appear uncomfortable with sitting.  Lumbar spine: Limited lumbar range of motion.  He is tender to palpation along the left lower lumbar spine.  No spasm.  No gross neurological deficit of either lower extremity.      Assessment & Plan:   Acute on chronic low back pain likely secondary to degenerative disc disease    I recommend an IM Depo-Medrol  injection and an IM Toradol  injection today for acute pain relief.  We will also provide him with a 6-day Sterapred Dosepak to take if pain persists after today's IM injections.  He will also trial lidocaine  patches as recommended by his PCP.  He will keep his appointment with Dr. Maryelizabeth but he will let me know if symptoms do not improve with today's treatment.  If that is the case, then consider imaging at that time.  Otherwise, follow-up as needed.  This note was dictated using Dragon naturally speaking software and may contain errors in syntax, spelling, or content which have not been identified prior to signing this note.

## 2023-11-11 ENCOUNTER — Emergency Department (HOSPITAL_BASED_OUTPATIENT_CLINIC_OR_DEPARTMENT_OTHER)
Admission: EM | Admit: 2023-11-11 | Discharge: 2023-11-11 | Disposition: A | Discharge status: Home / Self Care | Attending: Emergency Medicine | Admitting: Emergency Medicine

## 2023-11-11 ENCOUNTER — Other Ambulatory Visit: Payer: Self-pay

## 2023-11-11 ENCOUNTER — Encounter (HOSPITAL_BASED_OUTPATIENT_CLINIC_OR_DEPARTMENT_OTHER): Payer: Self-pay | Admitting: Emergency Medicine

## 2023-11-11 DIAGNOSIS — Z8709 Personal history of other diseases of the respiratory system: Secondary | ICD-10-CM | POA: Insufficient documentation

## 2023-11-11 DIAGNOSIS — R059 Cough, unspecified: Secondary | ICD-10-CM | POA: Diagnosis present

## 2023-11-11 DIAGNOSIS — J209 Acute bronchitis, unspecified: Secondary | ICD-10-CM | POA: Insufficient documentation

## 2023-11-11 MED ORDER — AZITHROMYCIN 250 MG PO TABS: 250.0000 mg | ORAL_TABLET | Freq: Every day | ORAL | 0 refills | Status: DC

## 2023-11-11 MED ORDER — BENZONATATE 100 MG PO CAPS: 100.0000 mg | ORAL_CAPSULE | Freq: Three times a day (TID) | ORAL | 0 refills | Status: DC

## 2023-11-11 MED ORDER — AZITHROMYCIN 250 MG PO TABS
500.0000 mg | ORAL_TABLET | Freq: Once | ORAL | Status: AC
  Administered 2023-11-11: 500 mg via ORAL
  Filled 2023-11-11: qty 2

## 2023-11-11 NOTE — ED Triage Notes (Signed)
 Patient coming to ED for evaluation of cough.  Reports symptoms started a few days ago.  Was told by PCP that he was beginning to show signs of pulmonary fibrosis and didn't need to be coughing a lot.  C/o chest congestion.  No reports of fever.

## 2023-11-11 NOTE — ED Provider Notes (Signed)
 [place] [facility] AT [facility] Provider Note   CSN: [id number] Arrival date & time: 11/11/23  [id number]     Patient presents with: Cough   Patrick [name] is a 73 y.o. male.   Patient is a 73 year old male presenting with complaints of chest congestion and cough.  This has been worsening over the past several days.  Several family members have been ill with similar complaints.  He denies any chest pain or shortness of breath.  He has tried over-the-counter cough medications with minimal relief.       Prior to Admission medications   Medication Sig Start Date End Date Taking? Authorizing Provider  benzonatate  (TESSALON ) 200 MG capsule Take 1 capsule (200 mg total) by mouth 3 (three) times daily as needed for cough. 05/16/22   Copland, Harlene BROCKS, MD  clonazePAM  (KLONOPIN ) 0.5 MG tablet TAKE 1 TO 2 TABLETS BY MOUTH EVERY NIGHT AT BEDTIME AS NEEDED FOR SLEEP 08/30/23   Copland, Harlene BROCKS, MD  diclofenac  Sodium (VOLTAREN  ARTHRITIS PAIN) 1 % GEL Apply 2 gm to upper extremity joints and 4 gm to lower extremity joints up to 4x daily.  Max 32g total per day 05/24/22   Copland, Harlene BROCKS, MD  famotidine  (PEPCID ) 20 MG tablet Take 1 tablet (20 mg total) by mouth 2 (two) times daily as needed for heartburn or indigestion. 09/14/23   Copland, Harlene BROCKS, MD  fluticasone  (FLONASE ) 50 MCG/ACT nasal spray Place 2 sprays into both nostrils daily. 10/14/15   Copland, Harlene BROCKS, MD  guaiFENesin  (MUCINEX ) 600 MG 12 hr tablet Take 2 tablets (1,200 mg total) by mouth 2 (two) times daily. 09/19/22   Almarie Waddell NOVAK, NP  HYDROcodone -acetaminophen  (NORCO/VICODIN) 5-325 MG tablet Take 1 tablet by mouth every 8 (eight) hours as needed for moderate pain (pain score 4-6). 09/21/23   Copland, Harlene BROCKS, MD  indomethacin  (INDOCIN ) 25 MG capsule Take 1 to 2 capsules three times daily as needed for headache. 04/23/20   Skeet Juliene SAUNDERS, DO  ipratropium (ATROVENT ) 0.03 % nasal spray Place 2 sprays into both  nostrils every 12 (twelve) hours. 05/16/22   Copland, Harlene BROCKS, MD  losartan  (COZAAR ) 50 MG tablet TAKE 1 TABLET BY MOUTH DAILY 12/05/22   Copland, Jessica C, MD  Melatonin 5 MG TABS Take 5 mg by mouth at bedtime.    [provider]  meloxicam  (MOBIC ) 15 MG tablet TAKE 1 TABLET BY MOUTH DAILY 11/07/22   Copland, Harlene BROCKS, MD  methylphenidate  (RITALIN ) 20 MG tablet Take 1 tablet (20 mg total) by mouth 2 (two) times daily. Use as needed for chronic fatigue 09/21/23   Copland, Harlene BROCKS, MD  montelukast  (SINGULAIR ) 10 MG tablet TAKE 1 TABLET BY MOUTH AT BEDTIME 05/09/23   Copland, Harlene BROCKS, MD  pantoprazole  (PROTONIX ) 40 MG tablet TAKE 1 TABLET BY MOUTH DAILY 06/26/23   Copland, Jessica C, MD  phenol (CHLORASEPTIC) 1.4 % LIQD Use as directed 1 spray in the mouth or throat as needed for throat irritation / pain. 09/19/22   Almarie Waddell NOVAK, NP  predniSONE  (DELTASONE ) 10 MG tablet Take by mouth as directed. 09/21/23   Draper, Evalene R, DO  pregabalin  (LYRICA ) 100 MG capsule TAKE TWO CAPSULES BY MOUTH EVERY NIGHT AT BEDTIME 08/30/23   Copland, Harlene BROCKS, MD  rosuvastatin  (CRESTOR ) 10 MG tablet TAKE 1 TABLET BY MOUTH DAILY 05/09/23   Copland, Jessica C, MD  sildenafil  (VIAGRA ) 100 MG tablet TAKE ONE-HALF TO ONE TABLET BY MOUTH DAILY  AS NEEDED 04/15/22   Copland, Harlene BROCKS, MD  sucralfate  (CARAFATE ) 1 g tablet Take 1 tablet (1 g total) by mouth 4 (four) times daily -  with meals and at bedtime. 07/04/22   Copland, Harlene BROCKS, MD  SUMAtriptan  (IMITREX ) 100 MG tablet Take 1 tablet earliest onset of migraine.  May repeat in 2 hours if headache persists or recurs.  Maximum 2 tablets in 24 hours. 05/16/22   Copland, Harlene BROCKS, MD  topiramate  (TOPAMAX ) 100 MG tablet TAKE 1 TABLET BY MOUTH DAILY 05/30/23   Copland, Jessica C, MD  traZODone  (DESYREL ) 150 MG tablet Take 1 tablet (150 mg total) by mouth at bedtime as needed for sleep. 07/17/23   Copland, Jessica C, MD  vitamin B-12 (CYANOCOBALAMIN ) 500 MCG tablet Take 500 mcg  by mouth daily.    [provider]    Allergies: Patient has no known allergies.    Review of Systems  All other systems reviewed and are negative.   Updated Vital Signs BP 124/62 (BP Location: Right Arm)   Pulse 88   Temp (!) 97.5 F (36.4 C) (Oral)   Resp 18   Ht 6' (1.829 m)   Wt 85.7 kg   SpO2 96%   BMI 25.63 kg/m   Physical Exam Vitals and nursing note reviewed.  Constitutional:      General: He is not in acute distress.    Appearance: He is well-developed. He is not diaphoretic.  HENT:     Head: Normocephalic and atraumatic.  Cardiovascular:     Rate and Rhythm: Normal rate and regular rhythm.     Heart sounds: No murmur heard.    No friction rub.  Pulmonary:     Effort: Pulmonary effort is normal. No respiratory distress.     Breath sounds: Normal breath sounds. No wheezing or rales.  Abdominal:     General: Bowel sounds are normal. There is no distension.     Palpations: Abdomen is soft.     Tenderness: There is no abdominal tenderness.  Musculoskeletal:        General: Normal range of motion.     Cervical back: Normal range of motion and neck supple.  Skin:    General: Skin is warm and dry.  Neurological:     Mental Status: He is alert and oriented to person, place, and time.     Coordination: Coordination normal.     (all labs ordered are listed, but only abnormal results are displayed) Labs Reviewed - No data to display  EKG: None  Radiology: No results found.   Procedures   Medications Ordered in the ED  azithromycin  (ZITHROMAX ) tablet 500 mg (has no administration in time range)                                    Medical Decision Making Risk Prescription drug management.   Patient presenting with cough as described in the HPI.  His cough is productive of green sputum.  He will be treated as bronchitis with Zithromax  and Tessalon  [place].  To follow-up as needed for any problems.     Final diagnoses:  None    ED  Discharge Orders     None          Geroldine Berg, MD 11/11/23 [phone]

## 2023-11-11 NOTE — Discharge Instructions (Signed)
Begin taking Zithromax as prescribed.  Begin taking Tessalon as prescribed as needed for cough.  Follow-up with primary doctor if not improving in the next few days, and return to the ER if symptoms significantly worsen or change.

## 2023-11-16 ENCOUNTER — Encounter: Payer: Self-pay | Admitting: Family Medicine

## 2023-11-16 DIAGNOSIS — R052 Subacute cough: Secondary | ICD-10-CM

## 2023-11-20 ENCOUNTER — Ambulatory Visit (HOSPITAL_BASED_OUTPATIENT_CLINIC_OR_DEPARTMENT_OTHER)
Admission: RE | Admit: 2023-11-20 | Discharge: 2023-11-20 | Disposition: A | Discharge status: Home / Self Care | Source: Ambulatory Visit | Attending: Family Medicine | Admitting: Family Medicine

## 2023-11-20 ENCOUNTER — Encounter: Payer: Self-pay | Admitting: Family Medicine

## 2023-11-20 DIAGNOSIS — R052 Subacute cough: Secondary | ICD-10-CM | POA: Diagnosis present

## 2023-11-20 NOTE — Addendum Note (Signed)
 Addended by: WATT RAISIN C on: 11/20/2023 10:12 AM   Modules accepted: Orders

## 2023-11-21 NOTE — Progress Notes (Unsigned)
 [facility] [address], [address] [place], [place] [address] [phone]  Date:  11/22/2023   Name:  TAVORIS BRISK   DOB:  Apr 29, 1950   MRN:  [id number]  PCP:  Watt Harlene BROCKS, MD    Chief Complaint: No chief complaint on file.   History of Present Illness:  USHER HEDBERG is a 73 y.o. very pleasant male patient who presents with the following:  Patient seen today with concern of cough.  I last saw him in July for exacerbation of chronic back pain -history of sleep apnea, HTN, headache, GERD, BPH He uses hydrocodone  occasionally for various musculoskeletal pains Also using clonazepam  for anxiety with success He uses methylphenidate  for ADHD sx   In the interim he has seen Dr. Melodi for his left knee, unfortunately he does have a meniscal tear He also continues to deal with his back, he was seen by Dr.Begovich with [facility]-see his note dated 8/14 They did pursue a lumbar MRI as well  Tim came to the emergency room here at [facility] on 8/30 with concern of chest congestion and cough He was treated for bronchitis with azithromycin , Tessalon  Perles.  He contacted me about a week later with continued symptoms, wondering what to take for his cough.  He does have some hydrocodone  on hand and I encouraged him to try that for cough, we then got a chest x-ray 2 days ago which was normal DG Chest 2 View Result Date: 11/20/2023 CLINICAL DATA:  Shortness of breath and productive cough x 1.5 weeks. EXAM: CHEST - 2 VIEW COMPARISON:  July 06, 2022 FINDINGS: The heart size and mediastinal contours are within normal limits. Both lungs are clear. The visualized skeletal structures are unremarkable. IMPRESSION: No active cardiopulmonary disease. Electronically Signed   By: Suzen Dials M.D.   On: 11/20/2023 13:47   Most recent CT chest was in 2023 Velinda is a former heavy smoker, he quit almost 20 years ago.  He  should qualify for lung cancer screening CT if he is interested Certainly he may have some COPD, diagnosis of emphysema on his chart He is not currently on any inhaler   @hpisecconsetabridge @  Patient Active Problem List   Diagnosis Date Noted   Abnormal CT scan of lung 10/29/2020   Chronic bronchitis (HCC) 10/29/2020   Urinary frequency    Recurrent canker sores    Primary snoring    OSA (obstructive sleep apnea)    Morton's neuroma    Lower back pain    Insomnia    Hypertension    History of migraine    Herpes simplex type 1 infection    GERD (gastroesophageal reflux disease)    Headache    Fibromyalgia    Emphysema of lung (HCC)    Dysphagia    Depression    BPH (benign prostatic hyperplasia)    Basal cell carcinoma    Arthritis    Anxiety    Dizziness 11/29/2019   Acquired mallet deformity of finger of right hand 11/25/2019   Chronic cough 02/09/2019   Tear of right rotator cuff 12/13/2017   Right rotator cuff tendinitis 01/10/2017   Hearing loss 12/15/2016   Chronic fatigue 10/09/2015   Generalized anxiety disorder 10/09/2015   Memory loss 10/09/2015   Chronic SI joint pain 05/25/2015   Recurrent major depressive disorder, in partial remission (HCC) 04/30/2015   Obstructive sleep apnea of adult 02/09/2015  Lumbar facet joint pain 04/09/2014   Benign prostatic hyperplasia with urinary obstruction 11/18/2013   Acid reflux 11/18/2013   Degenerative arthritis of lumbar spine 11/18/2013   Headache, migraine 11/18/2013   Anal pain 11/18/2013   Atopic dermatitis 11/18/2013   Bladder neck contracture 11/18/2013   Constipation 11/18/2013   Crush injury to finger 11/18/2013   Deviated nasal septum 11/18/2013   Empty sella syndrome (HCC) 11/18/2013   Abdominal pain 11/18/2013   Hematoma, subungual, third finger, left 11/18/2013   Hypertrophy of nasal turbinates 11/18/2013   Laceration of third finger, left 11/18/2013   Lateral epicondylitis 11/18/2013   Lymph  nodes enlarged 11/18/2013   Nonallopathic lesion of sacral region 11/18/2013   OAB (overactive bladder) 11/18/2013   Open fracture of distal phalanx of third finger of left hand 11/18/2013   Other intervertebral disc degeneration, lumbar region 11/18/2013   External hemorrhoids with other complication 12/20/2011   Restless leg 05/27/2009    Past Medical History:  Diagnosis Date   Anxiety    Arthritis    Basal cell carcinoma    BPH (benign prostatic hyperplasia)    Depression    Dysphagia    Emphysema of lung (HCC)    Fibromyalgia    GERD (gastroesophageal reflux disease)    otc  tums   Headache    migraines   Herpes simplex type 1 infection    History of migraine    Hypertension    no longer on medication   Insomnia    Lower back pain    Morton's neuroma    OSA (obstructive sleep apnea)    Primary snoring    Recurrent canker sores    Sleep apnea    Urinary frequency     Past Surgical History:  Procedure Laterality Date   EPIDIDYMECTOMY     for spermatocele   EYE SURGERY Bilateral    lasik eye surgery   FOOT SURGERY Left    INGUINAL HERNIA REPAIR Left    01/25/1999   MOUTH SURGERY     pancreatic surgery r/t trauma     SEPTOPLASTY N/A 02/09/2015   Procedure: SEPTOPLASTY;  Surgeon: Lonni FORBES Angle, MD;  Location: [facility] OR;  Service: ENT;  Laterality: N/A;   SKIN CANCER EXCISION     TONSILLECTOMY     TRANSURETHRAL INCISION OF PROSTATE     TURBINATE REDUCTION Bilateral 02/09/2015   Procedure: BILATERAL TURBINATE REDUCTION;  Surgeon: Lonni FORBES Angle, MD;  Location: [facility] OR;  Service: ENT;  Laterality: Bilateral;   UVULOPALATOPHARYNGOPLASTY N/A 02/09/2015   Procedure: UVULOPALATOPHARYNGOPLASTY (UPPP);  Surgeon: Lonni FORBES Angle, MD;  Location: [facility] OR;  Service: ENT;  Laterality: N/A;    Social History   Tobacco Use   Smoking status: Former    Current packs/day: 0.00    Average packs/day: 2.0 packs/day for 38.6 years (77.3 ttl pk-yrs)    Types:  Cigarettes    Start date: 03/1967    Quit date: 10/29/2005    Years since quitting: 18.0   Smokeless tobacco: Never  Vaping Use   Vaping status: Never Used  Substance Use Topics   Alcohol use: Yes    Alcohol/week: 5.0 standard drinks of alcohol    Types: 2 Glasses of wine, 3 Cans of beer per week    Comment: 1 beer every other day   Drug use: No    Family History  Problem Relation Age of Onset   Coronary artery disease Mother    Stroke Mother    Hypertension  Father    Anxiety disorder Brother    Skin cancer Brother    Stroke Brother    Colon cancer Neg Hx    Esophageal cancer Neg Hx    Stomach cancer Neg Hx     No Known Allergies  Medication list has been reviewed and updated.  Current Outpatient Medications on File Prior to Visit  Medication Sig Dispense Refill   azithromycin  (ZITHROMAX ) 250 MG tablet Take 1 tablet (250 mg total) by mouth daily. 4 tablet 0   benzonatate  (TESSALON ) 100 MG capsule Take 1 capsule (100 mg total) by mouth every 8 (eight) hours. 21 capsule 0   clonazePAM  (KLONOPIN ) 0.5 MG tablet TAKE 1 TO 2 TABLETS BY MOUTH EVERY NIGHT AT BEDTIME AS NEEDED FOR SLEEP 180 tablet 0   diclofenac  Sodium (VOLTAREN  ARTHRITIS PAIN) 1 % GEL Apply 2 gm to upper extremity joints and 4 gm to lower extremity joints up to 4x daily.  Max 32g total per day 300 g 3   famotidine  (PEPCID ) 20 MG tablet Take 1 tablet (20 mg total) by mouth 2 (two) times daily as needed for heartburn or indigestion. 180 tablet 0   fluticasone  (FLONASE ) 50 MCG/ACT nasal spray Place 2 sprays into both nostrils daily. 43 g 3   guaiFENesin  (MUCINEX ) 600 MG 12 hr tablet Take 2 tablets (1,200 mg total) by mouth 2 (two) times daily. 30 tablet 0   HYDROcodone -acetaminophen  (NORCO/VICODIN) 5-325 MG tablet Take 1 tablet by mouth every 8 (eight) hours as needed for moderate pain (pain score 4-6). 30 tablet 0   indomethacin  (INDOCIN ) 25 MG capsule Take 1 to 2 capsules three times daily as needed for headache. 20  capsule 0   ipratropium (ATROVENT ) 0.03 % nasal spray Place 2 sprays into both nostrils every 12 (twelve) hours. 30 mL 12   losartan  (COZAAR ) 50 MG tablet TAKE 1 TABLET BY MOUTH DAILY 90 tablet 3   Melatonin 5 MG TABS Take 5 mg by mouth at bedtime.     meloxicam  (MOBIC ) 15 MG tablet TAKE 1 TABLET BY MOUTH DAILY 90 tablet 3   methylphenidate  (RITALIN ) 20 MG tablet Take 1 tablet (20 mg total) by mouth 2 (two) times daily. Use as needed for chronic fatigue 30 tablet 0   montelukast  (SINGULAIR ) 10 MG tablet TAKE 1 TABLET BY MOUTH AT BEDTIME 90 tablet 3   pantoprazole  (PROTONIX ) 40 MG tablet TAKE 1 TABLET BY MOUTH DAILY 90 tablet 1   phenol (CHLORASEPTIC) 1.4 % LIQD Use as directed 1 spray in the mouth or throat as needed for throat irritation / pain. 177 mL 0   predniSONE  (DELTASONE ) 10 MG tablet Take by mouth as directed. 21 tablet 0   pregabalin  (LYRICA ) 100 MG capsule TAKE TWO CAPSULES BY MOUTH EVERY NIGHT AT BEDTIME 180 capsule 1   rosuvastatin  (CRESTOR ) 10 MG tablet TAKE 1 TABLET BY MOUTH DAILY 90 tablet 3   sildenafil  (VIAGRA ) 100 MG tablet TAKE ONE-HALF TO ONE TABLET BY MOUTH DAILY AS NEEDED 6 tablet 11   sucralfate  (CARAFATE ) 1 g tablet Take 1 tablet (1 g total) by mouth 4 (four) times daily -  with meals and at bedtime. 40 tablet 0   SUMAtriptan  (IMITREX ) 100 MG tablet Take 1 tablet earliest onset of migraine.  May repeat in 2 hours if headache persists or recurs.  Maximum 2 tablets in 24 hours. 10 tablet 5   topiramate  (TOPAMAX ) 100 MG tablet TAKE 1 TABLET BY MOUTH DAILY 90 tablet 3   traZODone  (  DESYREL ) 150 MG tablet Take 1 tablet (150 mg total) by mouth at bedtime as needed for sleep. 90 tablet 1   vitamin B-12 (CYANOCOBALAMIN ) 500 MCG tablet Take 500 mcg by mouth daily.     No current facility-administered medications on file prior to visit.    Review of Systems:  As per HPI- otherwise negative.   Physical Examination: There were no vitals filed for this visit. There were no  vitals filed for this visit. There is no height or weight on file to calculate BMI. Ideal Body Weight:    GEN: no acute distress. HEENT: Atraumatic, Normocephalic.  Ears and Nose: No external deformity. CV: RRR, No M/G/R. No JVD. No thrill. No extra heart sounds. PULM: CTA B, no wheezes, crackles, rhonchi. No retractions. No resp. distress. No accessory muscle use. ABD: S, NT, ND, +BS. No rebound. No HSM. EXTR: No c/c/e PSYCH: Normally interactive. Conversant.    Assessment and Plan: No diagnosis found.  Assessment & Plan   Signed Harlene Schroeder, MD

## 2023-11-21 NOTE — Patient Instructions (Incomplete)
 It was good to see you again today Once you are feeling better please do get a flu shot and also recommend COVID booster this fall

## 2023-11-22 ENCOUNTER — Ambulatory Visit (INDEPENDENT_AMBULATORY_CARE_PROVIDER_SITE_OTHER): Admitting: Family Medicine

## 2023-11-22 ENCOUNTER — Encounter: Payer: Self-pay | Admitting: Family Medicine

## 2023-11-22 VITALS — BP 112/71 | HR 61 | Ht 72.0 in | Wt 187.4 lb

## 2023-11-22 DIAGNOSIS — J42 Unspecified chronic bronchitis: Secondary | ICD-10-CM | POA: Diagnosis not present

## 2023-11-22 DIAGNOSIS — Z23 Encounter for immunization: Secondary | ICD-10-CM | POA: Diagnosis not present

## 2023-11-22 DIAGNOSIS — Z720 Tobacco use: Secondary | ICD-10-CM

## 2023-11-22 DIAGNOSIS — I10 Essential (primary) hypertension: Secondary | ICD-10-CM | POA: Diagnosis not present

## 2023-11-22 DIAGNOSIS — R052 Subacute cough: Secondary | ICD-10-CM | POA: Diagnosis not present

## 2023-11-22 DIAGNOSIS — Z122 Encounter for screening for malignant neoplasm of respiratory organs: Secondary | ICD-10-CM

## 2023-11-22 DIAGNOSIS — Z125 Encounter for screening for malignant neoplasm of prostate: Secondary | ICD-10-CM

## 2023-11-22 DIAGNOSIS — Z13220 Encounter for screening for lipoid disorders: Secondary | ICD-10-CM | POA: Diagnosis not present

## 2023-11-22 DIAGNOSIS — J439 Emphysema, unspecified: Secondary | ICD-10-CM | POA: Diagnosis not present

## 2023-11-22 DIAGNOSIS — Z131 Encounter for screening for diabetes mellitus: Secondary | ICD-10-CM

## 2023-11-22 DIAGNOSIS — M47816 Spondylosis without myelopathy or radiculopathy, lumbar region: Secondary | ICD-10-CM

## 2023-11-22 DIAGNOSIS — R350 Frequency of micturition: Secondary | ICD-10-CM

## 2023-11-22 DIAGNOSIS — M5430 Sciatica, unspecified side: Secondary | ICD-10-CM

## 2023-11-22 LAB — COMPREHENSIVE METABOLIC PANEL WITH GFR
ALT: 20 U/L (ref 0–53)
AST: 19 U/L (ref 0–37)
Albumin: 4.5 g/dL (ref 3.5–5.2)
Alkaline Phosphatase: 64 U/L (ref 39–117)
BUN: 19 mg/dL (ref 6–23)
CO2: 29 meq/L (ref 19–32)
Calcium: 9.4 mg/dL (ref 8.4–10.5)
Chloride: 107 meq/L (ref 96–112)
Creatinine, Ser: 1.01 mg/dL (ref 0.40–1.50)
GFR: 75.04 mL/min (ref >60.00)
Glucose, Bld: 96 mg/dL (ref 70–99)
Potassium: 4.4 meq/L (ref 3.5–5.1)
Sodium: 141 meq/L (ref 135–145)
Total Bilirubin: 0.7 mg/dL (ref 0.2–1.2)
Total Protein: 6.8 g/dL (ref 6.0–8.3)

## 2023-11-22 LAB — LIPID PANEL
Cholesterol: 116 mg/dL (ref 0–200)
HDL: 48.3 mg/dL (ref >39.00)
LDL Cholesterol: 51 mg/dL (ref 0–99)
NonHDL: 67.58
Total CHOL/HDL Ratio: 2
Triglycerides: 82 mg/dL (ref 0.0–149.0)
VLDL: 16.4 mg/dL (ref 0.0–40.0)

## 2023-11-22 LAB — CBC
HCT: 45.7 % (ref 39.0–52.0)
Hemoglobin: 15.3 g/dL (ref 13.0–17.0)
MCHC: 33.6 g/dL (ref 30.0–36.0)
MCV: 92.7 fl (ref 78.0–100.0)
Platelets: 158 K/uL (ref 150.0–400.0)
RBC: 4.92 Mil/uL (ref 4.22–5.81)
RDW: 13.2 % (ref 11.5–15.5)
WBC: 3.9 K/uL — ABNORMAL LOW (ref 4.0–10.5)

## 2023-11-22 LAB — PSA, MEDICARE: PSA: 0.45 ng/mL (ref 0.10–4.00)

## 2023-11-22 LAB — HEMOGLOBIN A1C: Hgb A1c MFr Bld: 5.8 % (ref 4.6–6.5)

## 2023-11-22 MED ORDER — MIRABEGRON ER 50 MG PO TB24: 50.0000 mg | ORAL_TABLET | Freq: Every day | ORAL | 3 refills | Status: AC

## 2023-11-22 MED ORDER — TIOTROPIUM BROMIDE MONOHYDRATE 18 MCG IN CAPS: 18.0000 ug | ORAL_CAPSULE | Freq: Every day | RESPIRATORY_TRACT | 12 refills | Status: AC

## 2023-11-24 ENCOUNTER — Encounter: Payer: Self-pay | Admitting: Family Medicine

## 2023-11-24 MED ORDER — HYDROCODONE-ACETAMINOPHEN 5-325 MG PO TABS: 1.0000 | ORAL_TABLET | Freq: Three times a day (TID) | ORAL | 0 refills | Status: AC | PRN

## 2023-12-04 ENCOUNTER — Ambulatory Visit: Admitting: Family Medicine

## 2023-12-05 ENCOUNTER — Other Ambulatory Visit: Payer: Self-pay | Admitting: Family Medicine

## 2023-12-05 DIAGNOSIS — I10 Essential (primary) hypertension: Secondary | ICD-10-CM

## 2023-12-11 ENCOUNTER — Other Ambulatory Visit: Payer: Self-pay | Admitting: Family Medicine

## 2023-12-11 MED ORDER — CLONAZEPAM 1 MG PO TABS: 1.0000 mg | ORAL_TABLET | Freq: Every day | ORAL | 0 refills | Status: DC

## 2023-12-11 NOTE — Telephone Encounter (Signed)
 Pharmacy comment: Clonazepam  0.5mg  for a quantity of 180 was filled on 11/29/2023 for 90 days. Too soon. Pls advise. Thx.

## 2023-12-11 NOTE — Addendum Note (Signed)
 Addended by: WATT RAISIN C on: 12/11/2023 01:11 PM   Modules accepted: Orders

## 2023-12-12 ENCOUNTER — Encounter (HOSPITAL_BASED_OUTPATIENT_CLINIC_OR_DEPARTMENT_OTHER): Payer: Self-pay | Admitting: Emergency Medicine

## 2023-12-12 ENCOUNTER — Emergency Department (HOSPITAL_BASED_OUTPATIENT_CLINIC_OR_DEPARTMENT_OTHER)

## 2023-12-12 ENCOUNTER — Emergency Department (HOSPITAL_BASED_OUTPATIENT_CLINIC_OR_DEPARTMENT_OTHER)
Admission: EM | Admit: 2023-12-12 | Discharge: 2023-12-12 | Disposition: A | Discharge status: Home / Self Care | Attending: Emergency Medicine | Admitting: Emergency Medicine

## 2023-12-12 ENCOUNTER — Other Ambulatory Visit: Payer: Self-pay

## 2023-12-12 DIAGNOSIS — R102 Pelvic and perineal pain unspecified side: Secondary | ICD-10-CM

## 2023-12-12 DIAGNOSIS — R350 Frequency of micturition: Secondary | ICD-10-CM | POA: Insufficient documentation

## 2023-12-12 DIAGNOSIS — R300 Dysuria: Secondary | ICD-10-CM | POA: Diagnosis not present

## 2023-12-12 LAB — COMPREHENSIVE METABOLIC PANEL WITH GFR
ALT: 18 U/L (ref 0–44)
AST: 18 U/L (ref 15–41)
Albumin: 4.7 g/dL (ref 3.5–5.0)
Alkaline Phosphatase: 79 U/L (ref 38–126)
Anion gap: 10 (ref 5–15)
BUN: 16 mg/dL (ref 8–23)
CO2: 26 mmol/L (ref 22–32)
Calcium: 9.6 mg/dL (ref 8.9–10.3)
Chloride: 104 mmol/L (ref 98–111)
Creatinine, Ser: 0.88 mg/dL (ref 0.61–1.24)
GFR, Estimated: >60 mL/min (ref >60)
Glucose, Bld: 106 mg/dL — ABNORMAL HIGH (ref 70–99)
Potassium: 4.2 mmol/L (ref 3.5–5.1)
Sodium: 140 mmol/L (ref 135–145)
Total Bilirubin: 0.8 mg/dL (ref 0.0–1.2)
Total Protein: 7.1 g/dL (ref 6.5–8.1)

## 2023-12-12 LAB — CBC WITH DIFFERENTIAL/PLATELET
Abs Immature Granulocytes: 0.04 K/uL (ref 0.00–0.07)
Basophils Absolute: 0 K/uL (ref 0.0–0.1)
Basophils Relative: 1 %
Eosinophils Absolute: 0 K/uL (ref 0.0–0.5)
Eosinophils Relative: 0 %
HCT: 45.4 % (ref 39.0–52.0)
Hemoglobin: 15.4 g/dL (ref 13.0–17.0)
Immature Granulocytes: 1 %
Lymphocytes Relative: 17 %
Lymphs Abs: 1 K/uL (ref 0.7–4.0)
MCH: 31 pg (ref 26.0–34.0)
MCHC: 33.9 g/dL (ref 30.0–36.0)
MCV: 91.3 fL (ref 80.0–100.0)
Monocytes Absolute: 0.6 K/uL (ref 0.1–1.0)
Monocytes Relative: 10 %
Neutro Abs: 4.3 K/uL (ref 1.7–7.7)
Neutrophils Relative %: 71 %
Platelets: 164 K/uL (ref 150–400)
RBC: 4.97 MIL/uL (ref 4.22–5.81)
RDW: 12.5 % (ref 11.5–15.5)
WBC: 6 K/uL (ref 4.0–10.5)
nRBC: 0 % (ref 0.0–0.2)

## 2023-12-12 LAB — URINALYSIS, ROUTINE W REFLEX MICROSCOPIC
Bilirubin Urine: NEGATIVE
Glucose, UA: NEGATIVE mg/dL
Hgb urine dipstick: NEGATIVE
Ketones, ur: NEGATIVE mg/dL
Leukocytes,Ua: NEGATIVE
Nitrite: NEGATIVE
Protein, ur: NEGATIVE mg/dL
Specific Gravity, Urine: 1.015 (ref 1.005–1.030)
pH: 6 (ref 5.0–8.0)

## 2023-12-12 MED ORDER — IOHEXOL 300 MG/ML  SOLN
85.0000 mL | Freq: Once | INTRAMUSCULAR | Status: AC | PRN
  Administered 2023-12-12: 85 mL via INTRAVENOUS

## 2023-12-12 MED ORDER — CIPROFLOXACIN HCL 500 MG PO TABS: 500.0000 mg | ORAL_TABLET | Freq: Two times a day (BID) | ORAL | 0 refills | Status: AC

## 2023-12-12 NOTE — ED Notes (Signed)
Lab notified of urine culture add on 

## 2023-12-12 NOTE — ED Provider Notes (Signed)
 [place] [facility] Provider Note   CSN: [id number] Arrival date & time: 12/12/23  1301     Patient presents with: Groin Pain   Patrick [name] is a 73 y.o. male.   Patient presents to the emergency department today for evaluation of pelvic pain, mainly felt in the perineal area, at the base of the scrotum.  Patient denies testicular pain or swelling.  He denies feeling any lumps or bumps there.  Symptoms started about 3 weeks ago.  He has had increased urinary frequency, however the pain has become very bothersome and is interfering with daily activities.  He has scheduled an appointment with a urologist however this is not for 2 additional weeks.  He reports some burning with urination but no blood in the urine.  He denies rectal pain or pain with bowel movements.  No fever or chills.  No vomiting.       Prior to Admission medications   Medication Sig Start Date End Date Taking? Authorizing Provider  azithromycin  (ZITHROMAX ) 250 MG tablet Take 1 tablet (250 mg total) by mouth daily. 11/11/23   Geroldine Berg, MD  benzonatate  (TESSALON ) 100 MG capsule Take 1 capsule (100 mg total) by mouth every 8 (eight) hours. 11/11/23   Geroldine Berg, MD  clonazePAM  (KLONOPIN ) 1 MG tablet Take 1 tablet (1 mg total) by mouth at bedtime. 12/11/23   Copland, Harlene BROCKS, MD  diclofenac  Sodium (VOLTAREN  ARTHRITIS PAIN) 1 % GEL Apply 2 gm to upper extremity joints and 4 gm to lower extremity joints up to 4x daily.  Max 32g total per day 05/24/22   Copland, Harlene BROCKS, MD  famotidine  (PEPCID ) 20 MG tablet Take 1 tablet (20 mg total) by mouth 2 (two) times daily as needed for heartburn or indigestion. 09/14/23   Copland, Harlene BROCKS, MD  fluticasone  (FLONASE ) 50 MCG/ACT nasal spray Place 2 sprays into both nostrils daily. 10/14/15   Copland, Harlene BROCKS, MD  guaiFENesin  (MUCINEX ) 600 MG 12 hr tablet Take 2 tablets (1,200 mg total) by mouth 2 (two) times daily. 09/19/22   Almarie Waddell NOVAK, NP  HYDROcodone -acetaminophen  (NORCO/VICODIN) 5-325 MG tablet Take 1 tablet by mouth every 8 (eight) hours as needed for moderate pain (pain score 4-6). 11/24/23   Copland, Harlene BROCKS, MD  indomethacin  (INDOCIN ) 25 MG capsule Take 1 to 2 capsules three times daily as needed for headache. 04/23/20   Skeet Juliene SAUNDERS, DO  ipratropium (ATROVENT ) 0.03 % nasal spray Place 2 sprays into both nostrils every 12 (twelve) hours. 05/16/22   Copland, Harlene BROCKS, MD  losartan  (COZAAR ) 50 MG tablet Take 1 tablet (50 mg total) by mouth daily. 12/05/23   Copland, Jessica C, MD  Melatonin 5 MG TABS Take 5 mg by mouth at bedtime.    [provider]  meloxicam  (MOBIC ) 15 MG tablet TAKE 1 TABLET BY MOUTH DAILY 11/07/22   Copland, Harlene BROCKS, MD  methylphenidate  (RITALIN ) 20 MG tablet Take 1 tablet (20 mg total) by mouth 2 (two) times daily. Use as needed for chronic fatigue 09/21/23   Copland, Harlene BROCKS, MD  mirabegron  ER (MYRBETRIQ ) 50 MG TB24 tablet Take 1 tablet (50 mg total) by mouth daily. 11/22/23   Copland, Harlene BROCKS, MD  montelukast  (SINGULAIR ) 10 MG tablet TAKE 1 TABLET BY MOUTH AT BEDTIME 05/09/23   Copland, Harlene BROCKS, MD  pantoprazole  (PROTONIX ) 40 MG tablet TAKE 1 TABLET BY MOUTH DAILY 06/26/23   Copland, Jessica C, MD  phenol (  CHLORASEPTIC) 1.4 % LIQD Use as directed 1 spray in the mouth or throat as needed for throat irritation / pain. 09/19/22   Almarie Waddell NOVAK, NP  predniSONE  (DELTASONE ) 10 MG tablet Take by mouth as directed. 09/21/23   Draper, Evalene SAUNDERS, DO  pregabalin  (LYRICA ) 100 MG capsule TAKE TWO CAPSULES BY MOUTH EVERY NIGHT AT BEDTIME 08/30/23   Copland, Harlene BROCKS, MD  rosuvastatin  (CRESTOR ) 10 MG tablet TAKE 1 TABLET BY MOUTH DAILY 05/09/23   Copland, Jessica C, MD  sildenafil  (VIAGRA ) 100 MG tablet TAKE ONE-HALF TO ONE TABLET BY MOUTH DAILY AS NEEDED 04/15/22   Copland, Harlene BROCKS, MD  sucralfate  (CARAFATE ) 1 g tablet Take 1 tablet (1 g total) by mouth 4 (four) times daily -  with meals and at  bedtime. 07/04/22   Copland, Harlene BROCKS, MD  SUMAtriptan  (IMITREX ) 100 MG tablet Take 1 tablet earliest onset of migraine.  May repeat in 2 hours if headache persists or recurs.  Maximum 2 tablets in 24 hours. 05/16/22   Copland, Jessica C, MD  tiotropium (SPIRIVA  HANDIHALER) 18 MCG inhalation capsule Place 1 capsule (18 mcg total) into inhaler and inhale daily. 11/22/23   Copland, Harlene BROCKS, MD  topiramate  (TOPAMAX ) 100 MG tablet TAKE 1 TABLET BY MOUTH DAILY 05/30/23   Copland, Jessica C, MD  traZODone  (DESYREL ) 150 MG tablet Take 1 tablet (150 mg total) by mouth at bedtime as needed for sleep. 07/17/23   Copland, Jessica C, MD  vitamin B-12 (CYANOCOBALAMIN ) 500 MCG tablet Take 500 mcg by mouth daily.    [provider]    Allergies: Patient has no known allergies.    Review of Systems  Updated Vital Signs BP (!) 134/90 (BP Location: Right Arm)   Pulse 91   Temp 98.2 F (36.8 C) (Oral)   Resp 14   Ht 6' (1.829 m)   Wt 81.6 kg   SpO2 95%   BMI 24.41 kg/m   Physical Exam Vitals and nursing note reviewed. Exam conducted with a chaperone present.  Constitutional:      Appearance: He is well-developed.  HENT:     Head: Normocephalic and atraumatic.  Eyes:     Conjunctiva/sclera: Conjunctivae normal.  Pulmonary:     Effort: No respiratory distress.  Genitourinary:    Testes:        Right: Mass, tenderness or swelling not present.        Left: Mass, tenderness or swelling not present.     Comments: Patient is most tender on the perineum with palpation, behind the scrotum, favoring the right side.  I do not feel any obvious lumps or abscesses.  When I palpate deeply, the patient does wince.  No testicular tenderness. Musculoskeletal:     Cervical back: Normal range of motion and neck supple.  Skin:    General: Skin is warm and dry.  Neurological:     Mental Status: He is alert.     (all labs ordered are listed, but only abnormal results are displayed) Labs Reviewed   URINALYSIS, ROUTINE W REFLEX MICROSCOPIC    EKG: None  Radiology: No results found.   Procedures   Medications Ordered in the ED - No data to display  ED Course  Patient seen and examined. History obtained directly from patient. Work-up including labs, imaging, EKG ordered in triage, if performed, were reviewed.    Labs/EKG: Independently reviewed and interpreted.  This included: UA, negative.  Imaging: Discussed imaging with patient. Will proceed with CT  pelvis with contrast to exclude other causes such as abscess, given normal UA and generally atypical presentation.   Medications/Fluids: None ordered  Most recent vital signs reviewed and are as follows: BP (!) 134/90 (BP Location: Right Arm)   Pulse 91   Temp 98.2 F (36.8 C) (Oral)   Resp 14   Ht 6' (1.829 m)   Wt 81.6 kg   SpO2 95%   BMI 24.41 kg/m   Initial impression: Perineal pain.  Patient appears well, nontoxic, however is very uncomfortable.  Will obtain labs and imaging to rule out more significant etiology.  If negative, will plan to treat for prostatitis and have patient follow-up with urology as planned.  4:53 PM Reassessment performed. Patient appears stable.  Patient is discussed with and seen earlier by Dr. Yolande.  Labs personally reviewed and interpreted including: CBC unremarkable; CMP unremarkable; UA again negative.  Imaging personally visualized and interpreted including: CT of the pelvis, agree no signs of abscess or other acute findings which would explain the patient's pain.  No prostate abnormality noted.  Reviewed pertinent lab work and imaging with patient at bedside. Questions answered.   Most current vital signs reviewed and are as follows: BP (!) 134/90 (BP Location: Right Arm)   Pulse 91   Temp 98.2 F (36.8 C) (Oral)   Resp 14   Ht 6' (1.829 m)   Wt 81.6 kg   SpO2 95%   BMI 24.41 kg/m   Plan: Discharge to home.   Prescriptions written for: Ciprofloxacin x 14 days, we  did discuss tendinopathy side effects.  Other home care instructions discussed: Close monitoring of symptoms, home medications for pain.  ED return instructions discussed: The patient was urged to return to the Emergency Department immediately with worsening of current symptoms, worsening abdominal pain, persistent vomiting, blood noted in stools, fever, redness or swelling in the pelvis or groin.  Follow-up instructions discussed: Patient encouraged to follow-up with their PCP in 7 days, urologist as planned.                                    Medical Decision Making Amount and/or Complexity of Data Reviewed Labs: ordered. Radiology: ordered.  Risk Prescription drug management.   Patient presenting with worsening pelvic pain as described above.  Fortunately lab workup today is reassuring.  CT was performed and does not show any signs of obvious infection, abscess, or other abnormality.  Will clinically treat for prostatitis.  Patient does not appear to be septic or toxic.  He has outpatient follow-up with urology in about 2 weeks.  We discussed signs and symptoms that should cause him to return.  The patient's vital signs, pertinent lab work and imaging were reviewed and interpreted as discussed in the ED course. Hospitalization was considered for further testing, treatments, or serial exams/observation. However as patient is well-appearing, has a stable exam, and reassuring studies today, I do not feel that they warrant admission at this time. This plan was discussed with the patient who verbalizes agreement and comfort with this plan and seems reliable and able to return to the Emergency Department with worsening or changing symptoms.       Final diagnoses:  Pelvic pain    ED Discharge Orders          Ordered    ciprofloxacin (CIPRO) 500 MG tablet  Every 12 hours  12/12/23 1652               Desiderio Chew, PA-C 12/12/23 1656    Yolande Lamar BROCKS,  MD 12/17/23 [phone]

## 2023-12-12 NOTE — Discharge Instructions (Signed)
 Please read and follow all provided instructions.  Your diagnoses today include:  1. Pelvic pain    Tests performed today include: Complete blood cell count: No problems Basic metabolic panel: No problems Urine testing: No signs of infection, culture sent and is pending, this can be followed up on by your urologist CT scan of the pelvis: Did not show any acute findings that explain your pain today Vital signs. See below for your results today.   Medications prescribed:  Ciprofloxacin - antibiotic to treat for possible prostatitis  You have been prescribed an antibiotic medicine: take the entire course of medicine even if you are feeling better. Stopping early can cause the antibiotic not to work.  Take any prescribed medications only as directed.  Home care instructions:  Follow any educational materials contained in this packet.  BE VERY CAREFUL not to take multiple medicines containing Tylenol  (also called acetaminophen ). Doing so can lead to an overdose which can damage your liver and cause liver failure and possibly death.   Follow-up instructions: Please follow-up with your primary care provider in the next 7 days for further evaluation of your symptoms and your urologist as planned.  Return instructions:  Please return to the Emergency Department if you experience worsening symptoms.  Please return with worsening uncontrolled pain, swelling, redness/warmth in the groin, fever. Please return if you have any other emergent concerns.  Additional Information:  Your vital signs today were: BP (!) 134/90 (BP Location: Right Arm)   Pulse 91   Temp 98.2 F (36.8 C) (Oral)   Resp 14   Ht 6' (1.829 m)   Wt 81.6 kg   SpO2 95%   BMI 24.41 kg/m  If your blood pressure (BP) was elevated above 135/85 this visit, please have this repeated by your doctor within one month. --------------

## 2023-12-12 NOTE — ED Notes (Signed)
 ED Provider at bedside.

## 2023-12-12 NOTE — ED Triage Notes (Signed)
 Pt c/o penile pain x 3 weeks. Has appt with urologist in 2 weeks.   Reports burning with urination, denies fever.

## 2023-12-13 LAB — URINE CULTURE: Culture: NO GROWTH

## 2023-12-24 ENCOUNTER — Other Ambulatory Visit: Payer: Self-pay | Admitting: Family Medicine

## 2024-01-10 ENCOUNTER — Other Ambulatory Visit: Payer: Self-pay | Admitting: Family Medicine

## 2024-01-10 DIAGNOSIS — F5101 Primary insomnia: Secondary | ICD-10-CM

## 2024-02-20 ENCOUNTER — Other Ambulatory Visit: Payer: Self-pay | Admitting: Family Medicine

## 2024-02-20 DIAGNOSIS — G894 Chronic pain syndrome: Secondary | ICD-10-CM

## 2024-02-27 ENCOUNTER — Encounter: Payer: Self-pay | Admitting: Family Medicine

## 2024-02-27 MED ORDER — TADALAFIL 5 MG PO TABS: 5.0000 mg | ORAL_TABLET | Freq: Every day | ORAL | 3 refills | Status: AC

## 2024-02-28 ENCOUNTER — Other Ambulatory Visit: Payer: Self-pay | Admitting: Family Medicine

## 2024-03-05 ENCOUNTER — Telehealth: Payer: Self-pay

## 2024-03-05 MED ORDER — CLONAZEPAM 1 MG PO TABS: 1.0000 mg | ORAL_TABLET | Freq: Every day | ORAL | 0 refills | Status: AC

## 2024-03-05 NOTE — Telephone Encounter (Signed)
 Copied from CRM #[id number]. Topic: Clinical - Medication Question >> Mar 05, 2024  9:30 AM Franky GRADE wrote: Reason for CRM: [facility] is calling for clarification on clonazePAM  (KLONOPIN ) 1 MG tablet [[id number]].

## 2024-03-05 NOTE — Addendum Note (Signed)
 Addended by: WATT RAISIN C on: 03/05/2024 12:09 PM   Modules accepted: Orders

## 2024-04-29 ENCOUNTER — Encounter (HOSPITAL_BASED_OUTPATIENT_CLINIC_OR_DEPARTMENT_OTHER)

## 2024-04-29 ENCOUNTER — Ambulatory Visit (HOSPITAL_BASED_OUTPATIENT_CLINIC_OR_DEPARTMENT_OTHER): Admitting: Pulmonary Disease

## 2024-07-24 ENCOUNTER — Ambulatory Visit
